# Patient Record
Sex: Female | Born: 1995 | Race: White | Hispanic: No | Smoking: Never smoker
Health system: Southern US, Community
[De-identification: ages and names within clinical notes are randomized; demographics above are authoritative.]

## PROBLEM LIST (undated history)

## (undated) ENCOUNTER — Inpatient Hospital Stay (HOSPITAL_COMMUNITY): Payer: Self-pay

## (undated) DIAGNOSIS — I10 Essential (primary) hypertension: Secondary | ICD-10-CM

## (undated) DIAGNOSIS — F329 Major depressive disorder, single episode, unspecified: Secondary | ICD-10-CM

## (undated) DIAGNOSIS — G8190 Hemiplegia, unspecified affecting unspecified side: Principal | ICD-10-CM

## (undated) DIAGNOSIS — F32A Depression, unspecified: Secondary | ICD-10-CM

## (undated) DIAGNOSIS — N390 Urinary tract infection, site not specified: Secondary | ICD-10-CM

## (undated) DIAGNOSIS — G47411 Narcolepsy with cataplexy: Secondary | ICD-10-CM

## (undated) DIAGNOSIS — F419 Anxiety disorder, unspecified: Secondary | ICD-10-CM

## (undated) DIAGNOSIS — O24419 Gestational diabetes mellitus in pregnancy, unspecified control: Secondary | ICD-10-CM

## (undated) DIAGNOSIS — M549 Dorsalgia, unspecified: Secondary | ICD-10-CM

## (undated) HISTORY — DX: Urinary tract infection, site not specified: N39.0

## (undated) HISTORY — PX: NO PAST SURGERIES: SHX2092

## (undated) HISTORY — DX: Essential (primary) hypertension: I10

## (undated) HISTORY — DX: Hemiplegia, unspecified affecting unspecified side: G81.90

---
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
## 2014-01-04 IMAGING — CT CT ABD-PELV W/O CM
1 of 2 series · 15 of 32 positions shown, 19 images · non-contrast
Comparison: None.

CLINICAL DATA: Right flank pain.  Right lower quadrant abdominal
pain.  Nausea and vomiting.  Diarrhea.

CT ABDOMEN AND PELVIS WITHOUT CONTRAST
TECHNIQUE: Multidetector CT imaging of the abdomen and pelvis was
performed following the standard protocol without intravenous
contrast.

[Series 2: stone 160 5.0 b31f st · axial · 0.74mm/px · z∈[+566,+1026]mm · 15 of 100 slices shown, 19 images]
[im 4/100  soft-tissue]
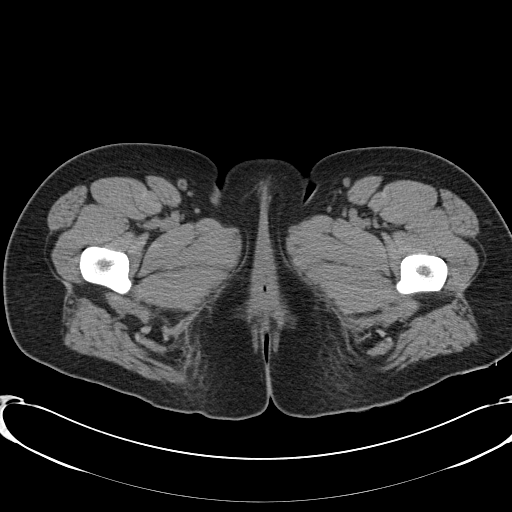
[im 4/100  bone]
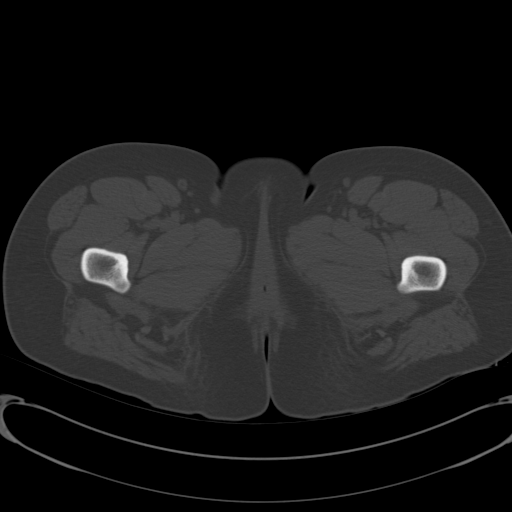
[im 12/100  soft-tissue]
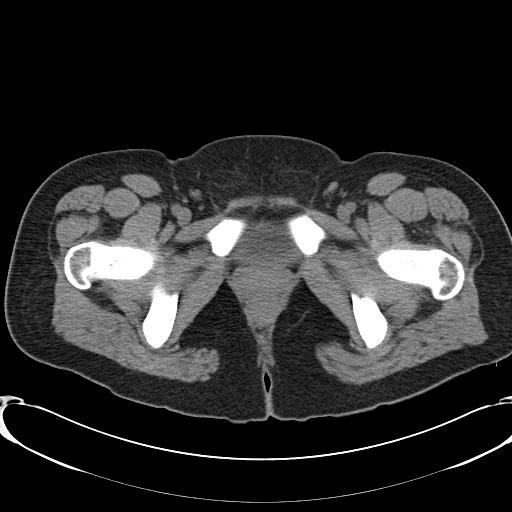
[im 20/100  soft-tissue]
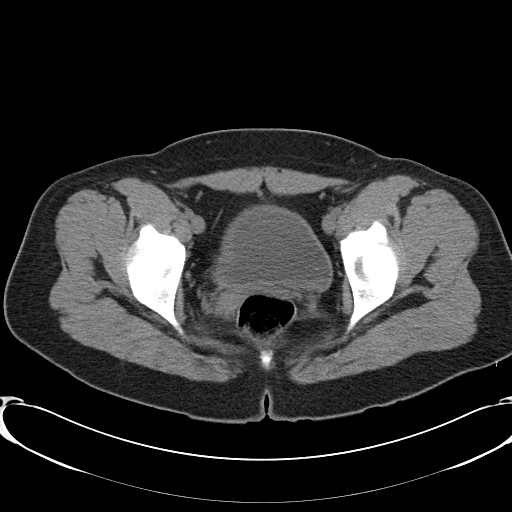
[im 28/100  soft-tissue]
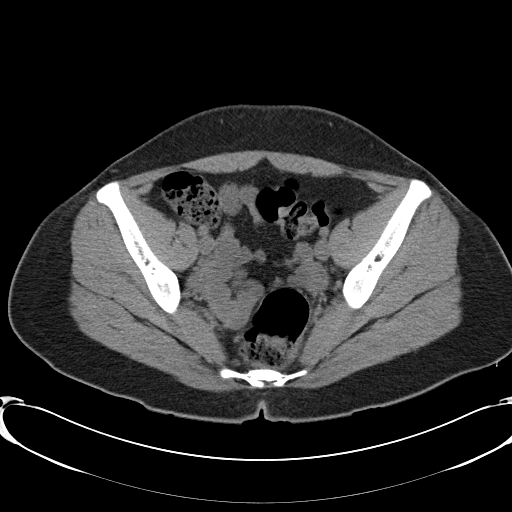
[im 36/100  soft-tissue]
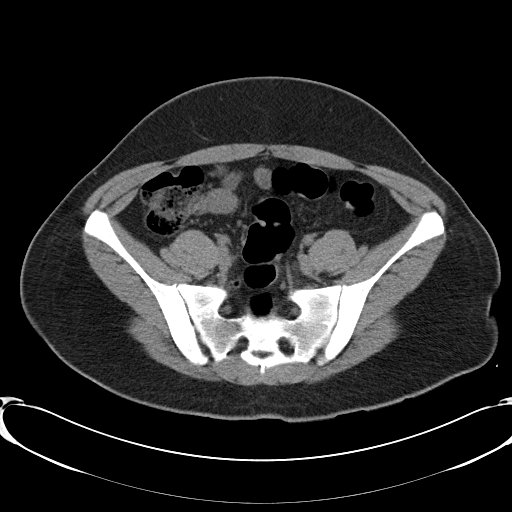
[im 44/100  soft-tissue]
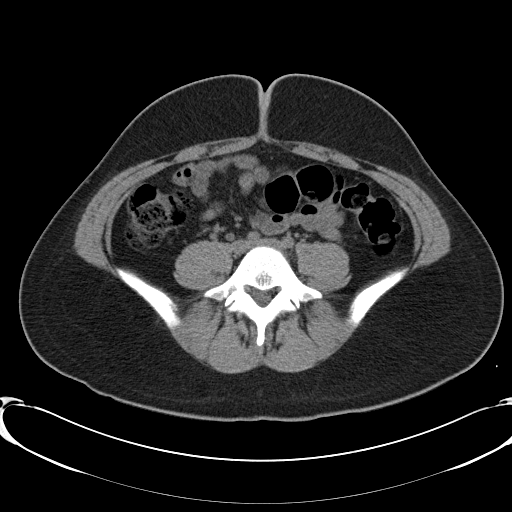
[im 52/100  soft-tissue]
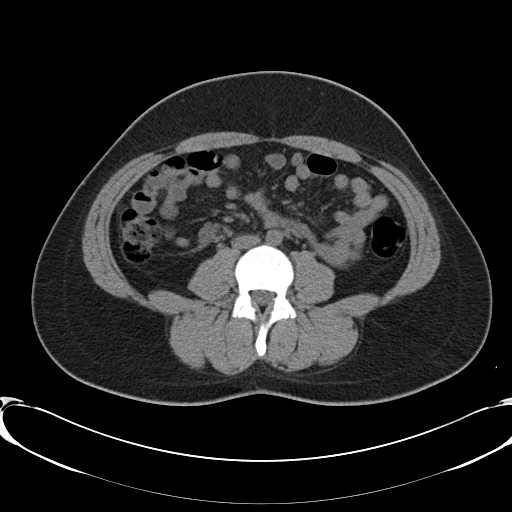
[im 56/100  soft-tissue]
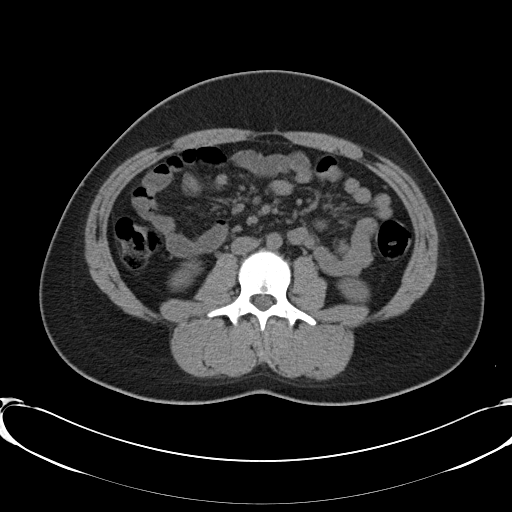
[im 64/100  soft-tissue]
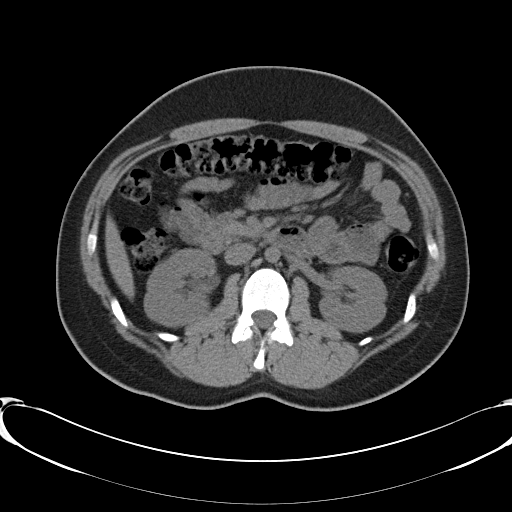
[im 64/100  bone]
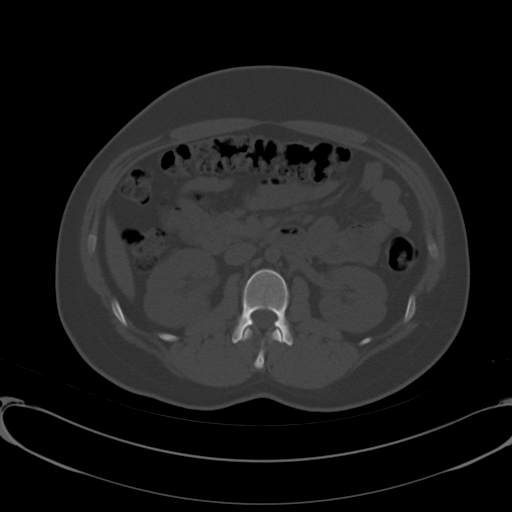
[im 72/100  soft-tissue]
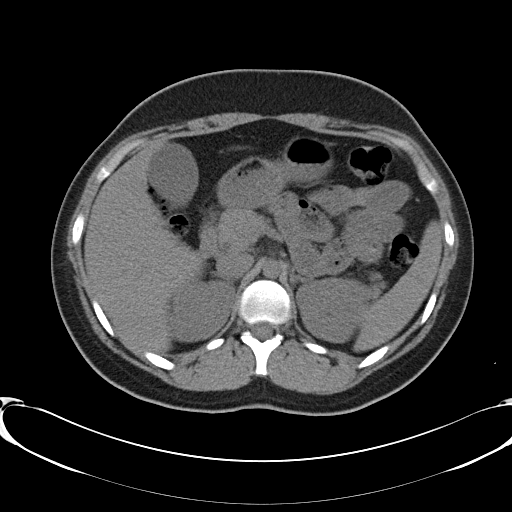
[im 80/100  soft-tissue]
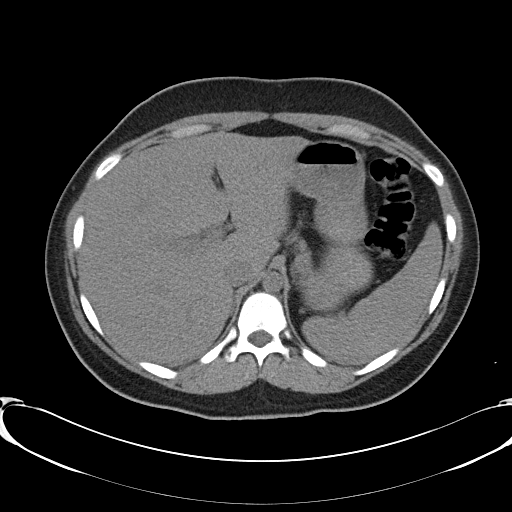
[im 84/100  lung]
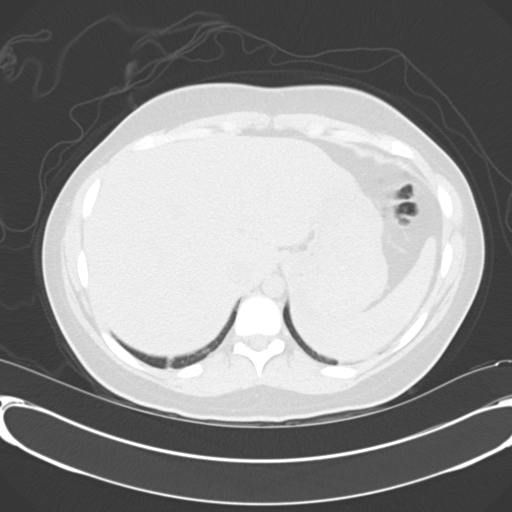
[im 88/100  soft-tissue]
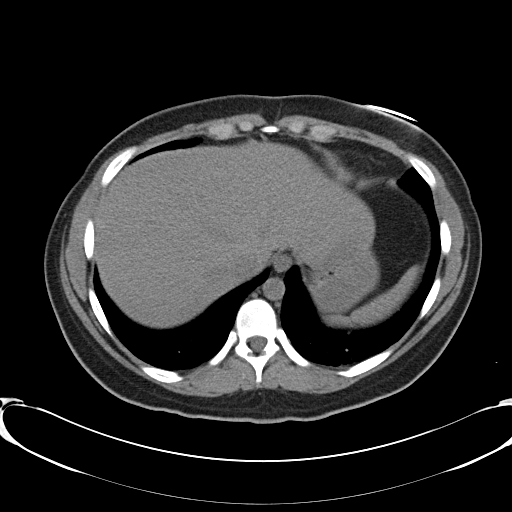
[im 88/100  lung]
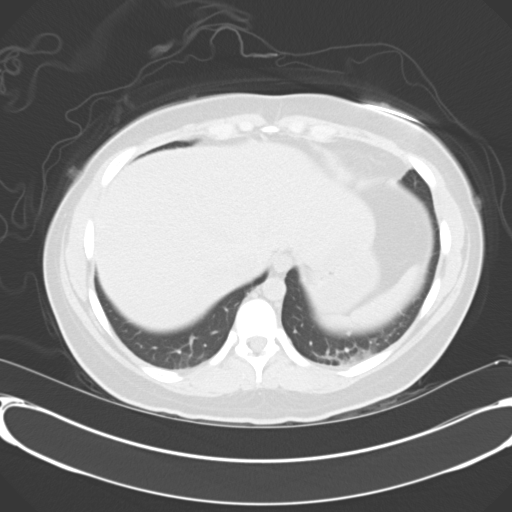
[im 92/100  lung]
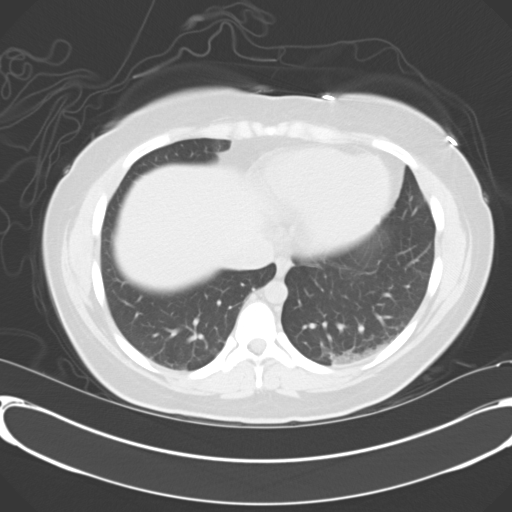
[im 96/100  soft-tissue]
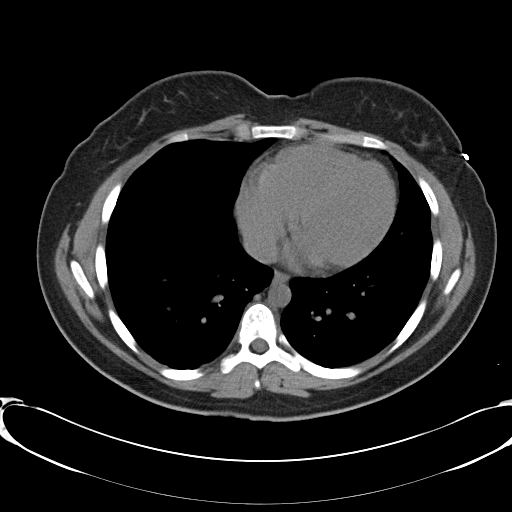
[im 96/100  lung]
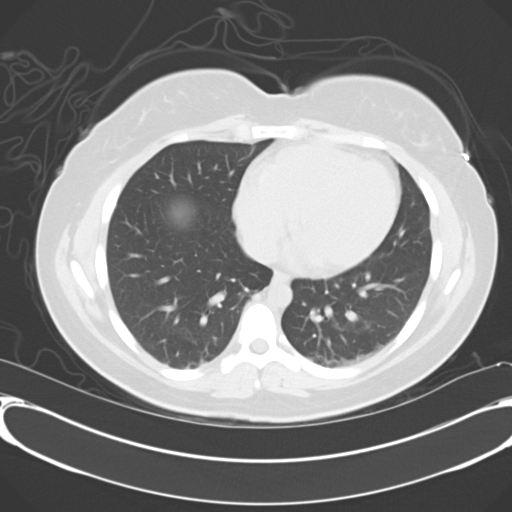

[15 of 32 positions shown; findings below may reference images not displayed]

FINDINGS: Mild atelectasis in the lung bases.  Mild dependent
atelectasis in the lung bases.

No renal, ureteral, or bladder stones are demonstrated.  There is
no significant pyelocaliectasis or ureterectasis.  The bladder wall
is not thickened.

The unenhanced appearance of the liver, spleen, gallbladder,
pancreas, adrenal glands, abdominal aorta, and retroperitoneal
lymph nodes is unremarkable.  The stomach, small bowel, and colon
are not abnormally distended.  Stool fills the colon.  No free air
or free fluid in the abdomen.

Pelvis:  The uterus and adnexal structures are not enlarged.  No
free or loculated pelvic fluid collections.  No evidence of
diverticulitis.  The appendix is normal.  No significant pelvic
lymphadenopathy.  Normal alignment of the lumbar vertebrae.  No
destructive bone lesions are appreciated.
IMPRESSION: No renal or ureteral stone or obstruction.  Appendix is normal.
Dependent atelectasis in the lung bases.

## 2014-01-18 ENCOUNTER — Ambulatory Visit (INDEPENDENT_AMBULATORY_CARE_PROVIDER_SITE_OTHER): Payer: Medicaid Other | Admitting: Family Medicine

## 2014-01-18 ENCOUNTER — Encounter: Payer: Self-pay | Admitting: Family Medicine

## 2014-01-18 VITALS — BP 127/81 | HR 77 | Wt 179.0 lb

## 2014-01-18 DIAGNOSIS — F53 Postpartum depression: Secondary | ICD-10-CM | POA: Insufficient documentation

## 2014-01-18 DIAGNOSIS — Z01812 Encounter for preprocedural laboratory examination: Secondary | ICD-10-CM

## 2014-01-18 DIAGNOSIS — O99345 Other mental disorders complicating the puerperium: Principal | ICD-10-CM

## 2014-01-18 DIAGNOSIS — Z30013 Encounter for initial prescription of injectable contraceptive: Secondary | ICD-10-CM

## 2014-01-18 LAB — POCT URINE PREGNANCY: Preg Test, Ur: NEGATIVE

## 2014-01-18 MED ORDER — MEDROXYPROGESTERONE ACETATE 150 MG/ML IM SUSP
150.0000 mg | INTRAMUSCULAR | Status: DC
  Administered 2014-01-18 – 2014-04-06 (×2): 150 mg via INTRAMUSCULAR

## 2014-01-18 MED ORDER — ESCITALOPRAM OXALATE 10 MG PO TABS: 10.0000 mg | ORAL_TABLET | Freq: Every day | ORAL | Status: DC

## 2014-01-18 NOTE — Patient Instructions (Addendum)
Depression Depression refers to feeling sad, low, down in the dumps, blue, gloomy, or empty. In general, there are two kinds of depression: 1. Normal sadness or normal grief. This kind of depression is one that we all feel from time to time after upsetting life experiences, such as the loss of a job or the ending of a relationship. This kind of depression is considered normal, is short lived, and resolves within a few days to 2 weeks. Depression experienced after the loss of a loved one (bereavement) often lasts longer than 2 weeks but normally gets better with time. 2. Clinical depression. This kind of depression lasts longer than normal sadness or normal grief or interferes with your ability to function at home, at work, and in school. It also interferes with your personal relationships. It affects almost every aspect of your life. Clinical depression is an illness. Symptoms of depression can also be caused by conditions other than those mentioned above, such as:  Physical illness. Some physical illnesses, including underactive thyroid gland (hypothyroidism), severe anemia, specific types of cancer, diabetes, uncontrolled seizures, heart and lung problems, strokes, and chronic pain are commonly associated with symptoms of depression.  Side effects of some prescription medicine. In some people, certain types of medicine can cause symptoms of depression.  Substance abuse. Abuse of alcohol and illicit drugs can cause symptoms of depression. SYMPTOMS Symptoms of normal sadness and normal grief include the following:  Feeling sad or crying for short periods of time.  Not caring about anything (apathy).  Difficulty sleeping or sleeping too much.  No longer able to enjoy the things you used to enjoy.  Desire to be by oneself all the time (social isolation).  Lack of energy or motivation.  Difficulty concentrating or remembering.  Change in appetite or weight.  Restlessness or  agitation. Symptoms of clinical depression include the same symptoms of normal sadness or normal grief and also the following symptoms:  Feeling sad or crying all the time.  Feelings of guilt or worthlessness.  Feelings of hopelessness or helplessness.  Thoughts of suicide or the desire to harm yourself (suicidal ideation).  Loss of touch with reality (psychotic symptoms). Seeing or hearing things that are not real (hallucinations) or having false beliefs about your life or the people around you (delusions and paranoia). DIAGNOSIS  The diagnosis of clinical depression is usually based on how bad the symptoms are and how long they have lasted. Your health care provider will also ask you questions about your medical history and substance use to find out if physical illness, use of prescription medicine, or substance abuse is causing your depression. Your health care provider may also order blood tests. TREATMENT  Often, normal sadness and normal grief do not require treatment. However, sometimes antidepressant medicine is given for bereavement to ease the depressive symptoms until they resolve. The treatment for clinical depression depends on how bad the symptoms are but often includes antidepressant medicine, counseling with a mental health professional, or both. Your health care provider will help to determine what treatment is best for you. Depression caused by physical illness usually goes away with appropriate medical treatment of the illness. If prescription medicine is causing depression, talk with your health care provider about stopping the medicine, decreasing the dose, or changing to another medicine. Depression caused by the abuse of alcohol or illicit drugs goes away when you stop using these substances. Some adults need professional help in order to stop drinking or using drugs. SEEK IMMEDIATE MEDICAL   CARE IF:  You have thoughts about hurting yourself or others.  You lose touch  with reality (have psychotic symptoms).  You are taking medicine for depression and have a serious side effect. FOR MORE INFORMATION  National Alliance on Mental Illness: www.nami.[employer]  National Institute of Mental Health: http://www.maynard.net/www.nimh.nih.gov Document Released: 01/26/2000 Document Revised: 06/14/2013 Document Reviewed: 04/29/2011 [facility] BoulevardExitCare Patient Information 2015 [place]ExitCare, [place]. This information is not intended to replace advice given to you by your health care provider. Make sure you discuss any questions you have with your health care provider. Place depot medroxyprogesterone acetate injection patient instructions here. Medroxyprogesterone injection [Contraceptive] What is this medicine? MEDROXYPROGESTERONE (me DROX ee proe JES te rone) contraceptive injections prevent pregnancy. They provide effective birth control for 3 months. Depo-subQ Provera 104 is also used for treating pain related to endometriosis. This medicine may be used for other purposes; ask your health care provider or pharmacist if you have questions. COMMON BRAND NAME(S): Depo-Provera, Depo-subQ Provera 104 What should I tell my health care provider before I take this medicine? They need to know if you have any of these conditions: -frequently drink alcohol -asthma -blood vessel disease or a history of a blood clot in the lungs or legs -bone disease such as osteoporosis -breast cancer -diabetes -eating disorder (anorexia nervosa or bulimia) -high blood pressure -HIV infection or AIDS -kidney disease -liver disease -mental depression -migraine -seizures (convulsions) -stroke -tobacco smoker -vaginal bleeding -an unusual or allergic reaction to medroxyprogesterone, other hormones, medicines, foods, dyes, or preservatives -pregnant or trying to get pregnant -breast-feeding How should I use this medicine? Depo-Provera Contraceptive injection is given into a muscle. Depo-subQ Provera 104 injection is given under  the skin. These injections are given by a health care professional. You must not be pregnant before getting an injection. The injection is usually given during the first 5 days after the start of a menstrual period or 6 weeks after delivery of a baby. Talk to your pediatrician regarding the use of this medicine in children. Special care may be needed. These injections have been used in female children who have started having menstrual periods. Overdosage: If you think you have taken too much of this medicine contact a poison control center or emergency room at once. NOTE: This medicine is only for you. Do not share this medicine with others. What if I miss a dose? Try not to miss a dose. You must get an injection once every 3 months to maintain birth control. If you cannot keep an appointment, call and reschedule it. If you wait longer than 13 weeks between Depo-Provera contraceptive injections or longer than 14 weeks between Depo-subQ Provera 104 injections, you could get pregnant. Use another method for birth control if you miss your appointment. You may also need a pregnancy test before receiving another injection. What may interact with this medicine? Do not take this medicine with any of the following medications: -bosentan This medicine may also interact with the following medications: -aminoglutethimide -antibiotics or medicines for infections, especially rifampin, rifabutin, rifapentine, and griseofulvin -aprepitant -barbiturate medicines such as phenobarbital or primidone -bexarotene -carbamazepine -medicines for seizures like ethotoin, felbamate, oxcarbazepine, phenytoin, topiramate -modafinil -St. John's wort This list may not describe all possible interactions. Give your health care provider a list of all the medicines, herbs, non-prescription drugs, or dietary supplements you use. Also tell them if you smoke, drink alcohol, or use illegal drugs. Some items may interact with your  medicine. What should I watch for while using this  medicine? This drug does not protect you against HIV infection (AIDS) or other sexually transmitted diseases. Use of this product may cause you to lose calcium from your bones. Loss of calcium may cause weak bones (osteoporosis). Only use this product for more than 2 years if other forms of birth control are not right for you. The longer you use this product for birth control the more likely you will be at risk for weak bones. Ask your health care professional how you can keep strong bones. You may have a change in bleeding pattern or irregular periods. Many females stop having periods while taking this drug. If you have received your injections on time, your chance of being pregnant is very low. If you think you may be pregnant, see your health care professional as soon as possible. Tell your health care professional if you want to get pregnant within the next year. The effect of this medicine may last a long time after you get your last injection. What side effects may I notice from receiving this medicine? Side effects that you should report to your doctor or health care professional as soon as possible: -allergic reactions like skin rash, itching or hives, swelling of the face, lips, or tongue -breast tenderness or discharge -breathing problems -changes in vision -depression -feeling faint or lightheaded, falls -fever -pain in the abdomen, chest, groin, or leg -problems with balance, talking, walking -unusually weak or tired -yellowing of the eyes or skin Side effects that usually do not require medical attention (report to your doctor or health care professional if they continue or are bothersome): -acne -fluid retention and swelling -headache -irregular periods, spotting, or absent periods -temporary pain, itching, or skin reaction at site where injected -weight gain This list may not describe all possible side effects. Call your  doctor for medical advice about side effects. You may report side effects to FDA at 1-800-FDA-1088. Where should I keep my medicine? This does not apply. The injection will be given to you by a health care professional. NOTE: This sheet is a summary. It may not cover all possible information. If you have questions about this medicine, talk to your doctor, pharmacist, or health care provider.  2015, Elsevier/Gold Standard. (2008-02-19 18:37:56)

## 2014-01-18 NOTE — Progress Notes (Addendum)
    Subjective:    Patient ID: Barbara [name] is a 18 y.o. female presenting with postpartum depression  on 01/18/2014  HPI: Reports feeling depressed.  Not able to do anything. Sleeps all day.  Tearful. Sleeps with frequent awakening. reports anhedonia, decreased ability to concentrate. Symptoms started after giving birth.  Wanted nothing to do with him. Symptoms are worse. Has moved in with her grandmother. Is about to get separated from her husband.  Never went back to work post delivery. Feels like it has gotten to a point where she needs to get better. Feels unbonded with child. Reports telling her grandmother she would be better off dead.  Has no suicidal plan. Reports occasionally hearing someone knock on door or say her name when falling asleep.  Desires Depo.  LMP was 12/12/13.  No intercourse since October. Has been on Depo before.  Review of Systems  Constitutional: Negative for fever and chills.  Respiratory: Negative for shortness of breath.   Cardiovascular: Negative for chest pain.  Gastrointestinal: Negative for nausea, vomiting and abdominal pain.  Genitourinary: Negative for dysuria.  Skin: Negative for rash.  Psychiatric/Behavioral: Positive for suicidal ideas, hallucinations (questionable while falling asleep), sleep disturbance, dysphoric mood and decreased concentration. Negative for self-injury.      Objective:    BP 127/81 mmHg  Pulse 77  Wt 179 lb (81.194 kg)  LMP 12/15/2013  Breastfeeding? No Physical Exam  Constitutional: She is oriented to person, place, and time. She appears well-developed and well-nourished. No distress.  HENT:  Head: Normocephalic and atraumatic.  Eyes: No scleral icterus.  Neck: Neck supple.  Cardiovascular: Normal rate.   Pulmonary/Chest: Effort normal.  Abdominal: Soft.  Neurological: She is alert and oriented to person, place, and time.  Skin: Skin is warm and dry.  Psychiatric: Her affect is blunt. She is slowed. She is not  agitated, not aggressive and not withdrawn. Thought content is not delusional. She exhibits a depressed mood. She expresses no suicidal ideation. She expresses no suicidal plans and no homicidal plans.        Assessment & Plan:   Problem List Items Addressed This Visit      Unprioritized   Depressed mood with postpartum onset - Primary    Contracts for safety.  Seek psychiatric help.  Begin Anti-depressant.    Relevant Medications      escitalopram (LEXAPRO) tablet    Other Visit Diagnoses    Encounter for initial prescription of injectable contraceptive        Relevant Medications       medroxyPROGESTERone (DEPO-PROVERA) injection 150 mg    Other Relevant Orders       POCT urine pregnancy (Completed)       Return in about 3 months (around 04/19/2014).

## 2014-01-18 NOTE — Assessment & Plan Note (Signed)
Contracts for safety.  Seek psychiatric help.  Begin Anti-depressant.

## 2014-03-10 ENCOUNTER — Ambulatory Visit: Payer: Self-pay | Admitting: Family Medicine

## 2014-03-29 ENCOUNTER — Telehealth: Payer: Self-pay | Admitting: *Deleted

## 2014-03-29 ENCOUNTER — Encounter: Payer: Self-pay | Admitting: *Deleted

## 2014-03-29 DIAGNOSIS — N760 Acute vaginitis: Principal | ICD-10-CM

## 2014-03-29 DIAGNOSIS — B9689 Other specified bacterial agents as the cause of diseases classified elsewhere: Secondary | ICD-10-CM

## 2014-03-29 MED ORDER — METRONIDAZOLE 500 MG PO TABS: 500.0000 mg | ORAL_TABLET | Freq: Two times a day (BID) | ORAL | Status: DC

## 2014-03-29 NOTE — Telephone Encounter (Signed)
Patient called and needed something called in for bacterial vaginosis.  I have sent in Flagyl to patients pharmacy.

## 2014-04-06 ENCOUNTER — Other Ambulatory Visit: Payer: Self-pay | Admitting: Family Medicine

## 2014-04-06 ENCOUNTER — Encounter: Payer: Self-pay | Admitting: Family Medicine

## 2014-04-06 ENCOUNTER — Ambulatory Visit (INDEPENDENT_AMBULATORY_CARE_PROVIDER_SITE_OTHER): Payer: Medicaid Other | Admitting: Family Medicine

## 2014-04-06 VITALS — BP 113/74 | HR 71 | Ht 66.75 in | Wt 182.2 lb

## 2014-04-06 DIAGNOSIS — F53 Postpartum depression: Secondary | ICD-10-CM

## 2014-04-06 DIAGNOSIS — N390 Urinary tract infection, site not specified: Secondary | ICD-10-CM | POA: Diagnosis not present

## 2014-04-06 DIAGNOSIS — Z113 Encounter for screening for infections with a predominantly sexual mode of transmission: Secondary | ICD-10-CM | POA: Diagnosis not present

## 2014-04-06 DIAGNOSIS — Z30013 Encounter for initial prescription of injectable contraceptive: Secondary | ICD-10-CM

## 2014-04-06 DIAGNOSIS — O99345 Other mental disorders complicating the puerperium: Principal | ICD-10-CM

## 2014-04-06 DIAGNOSIS — K644 Residual hemorrhoidal skin tags: Secondary | ICD-10-CM

## 2014-04-06 DIAGNOSIS — K648 Other hemorrhoids: Secondary | ICD-10-CM

## 2014-04-06 DIAGNOSIS — N898 Other specified noninflammatory disorders of vagina: Secondary | ICD-10-CM | POA: Diagnosis not present

## 2014-04-06 LAB — POCT URINALYSIS DIPSTICK
Bilirubin, UA: NEGATIVE
GLUCOSE UA: NEGATIVE
Ketones, UA: NEGATIVE
Nitrite, UA: NEGATIVE
PROTEIN UA: NEGATIVE
Spec Grav, UA: 1.01
UROBILINOGEN UA: NORMAL
pH, UA: 6.5

## 2014-04-06 MED ORDER — ESCITALOPRAM OXALATE 20 MG PO TABS: 20.0000 mg | ORAL_TABLET | Freq: Every day | ORAL | Status: DC

## 2014-04-06 MED ORDER — HYDROCORTISONE ACETATE 25 MG RE SUPP: 25.0000 mg | Freq: Two times a day (BID) | RECTAL | Status: DC

## 2014-04-06 MED ORDER — NITROFURANTOIN MONOHYD MACRO 100 MG PO CAPS: 100.0000 mg | ORAL_CAPSULE | Freq: Two times a day (BID) | ORAL | Status: DC

## 2014-04-06 NOTE — Assessment & Plan Note (Signed)
Given slight improvement, will increase dose.  Return in 4 wks for re-review.  May need another SSRI and keep working at finding therapist. She will f/u with Primary care as well.

## 2014-04-06 NOTE — Progress Notes (Signed)
Subjective:    Patient ID: Barbara [name] is a 19 y.o. female presenting with Follow-up  on 04/06/2014  HPI: Patient is here to follow up on Lexapro and she does not feel like the Lexapro is helping with her energy levels.  She is sleepy all the time and not feeling like doing anything other than sleeping all the time.  She added iron to her vitamins to see if that would help but doesn't feel like it has made a difference. She reports increased energy levels.  Began 2x/day Lexapro last week. No difference noted yet.  Reports not having a lot of luck finding a therapist who takes medicaid. She also would like to discuss the Metronidazole which does not seem to be helping her BV  She continues to have fishy odor and increased discharge. She is sexually active. Started trying to treat for UTI which improved symptoms. Urine is dark in nature. She is also suffering with hemorrhoids. Reports bleeding with wiping.  Review of Systems  Constitutional: Negative for fever and chills.  Respiratory: Negative for shortness of breath.   Cardiovascular: Negative for chest pain.  Gastrointestinal: Positive for anal bleeding. Negative for nausea, vomiting and abdominal pain.  Genitourinary: Negative for dysuria.  Skin: Negative for rash.  Psychiatric/Behavioral: Positive for sleep disturbance, dysphoric mood and decreased concentration. Negative for hallucinations and self-injury. The patient is not hyperactive.       Objective:    BP 113/74 mmHg  Pulse 71  Ht 5' 6.75" (1.695 m)  Wt 182 lb 3.2 oz (82.645 kg)  BMI 28.77 kg/m2  LMP   Breastfeeding? No Physical Exam  Constitutional: She is oriented to person, place, and time. She appears well-developed and well-nourished. No distress.  HENT:  Head: Normocephalic and atraumatic.  Eyes: No scleral icterus.  Neck: Neck supple.  Cardiovascular: Normal rate.   Pulmonary/Chest: Effort normal.  Abdominal: Soft.  Genitourinary: Rectal exam shows  external hemorrhoid. Uterus is not enlarged and not tender. Cervix exhibits no motion tenderness. Vaginal discharge found.  Neurological: She is alert and oriented to person, place, and time.  Skin: Skin is warm and dry.  Psychiatric: She has a normal mood and affect.   U/a is positive for blood and leuks.     Assessment & Plan:   Problem List Items Addressed This Visit      Unprioritized   Depressed mood with postpartum onset - Primary    Given slight improvement, will increase dose.  Return in 4 wks for re-review.  May need another SSRI and keep working at finding therapist. She will f/u with Primary care as well.      Relevant Medications   escitalopram (LEXAPRO) tablet    Other Visit Diagnoses    Vaginal discharge        Check wet prep and G/Chlam    Relevant Orders    Wet prep, genital    Screen for STD (sexually transmitted disease)        Relevant Orders    GC/Chlamydia Probe Amp    HIV antibody (with reflex)    UTI (lower urinary tract infection)        Treat presumptively and culture.    Relevant Medications    nitrofurantoin, macrocrystal-monohydrate, (MACROBID) 100 MG capsule    Other Relevant Orders    POCT urinalysis dipstick (Completed)    Urine Culture    External hemorrhoid, bleeding        Relevant Medications    hydrocortisone (ANUSOL-HC) suppository  25 mg

## 2014-04-06 NOTE — Patient Instructions (Signed)

## 2014-04-07 LAB — HIV ANTIBODY (ROUTINE TESTING W REFLEX): HIV: NONREACTIVE

## 2014-04-07 LAB — GC/CHLAMYDIA PROBE AMP
CT Probe RNA: NEGATIVE
GC Probe RNA: NEGATIVE

## 2014-04-07 LAB — WET PREP, GENITAL
Trich, Wet Prep: NONE SEEN
Yeast Wet Prep HPF POC: NONE SEEN

## 2014-04-07 LAB — URINE CULTURE: Colony Count: 5000

## 2014-05-03 ENCOUNTER — Ambulatory Visit: Payer: Self-pay | Admitting: Family Medicine

## 2014-05-23 IMAGING — US US OB NUCHAL TRANSLUCENCY 1ST GEST
1 series · 13 of 26 positions shown · non-contrast
Comparison: none

[Series 1: us ob nuchal translucency 1st gest · 0.23mm/px · 13 of 26 slices shown]
[im 2/26]
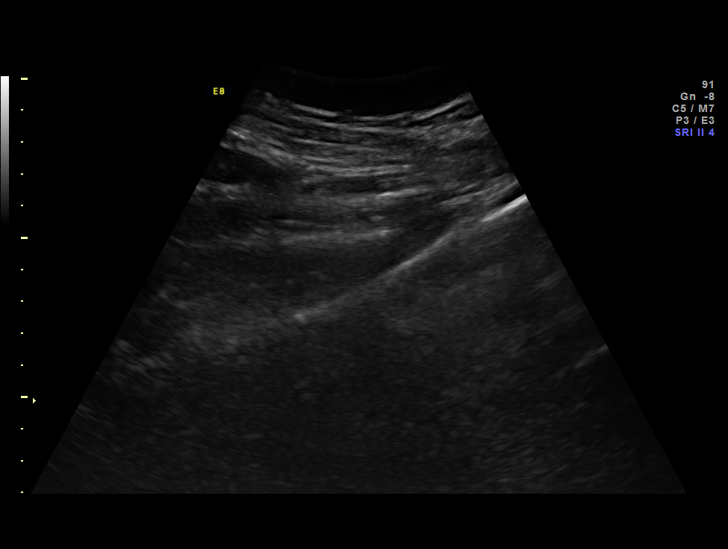
[im 4/26]
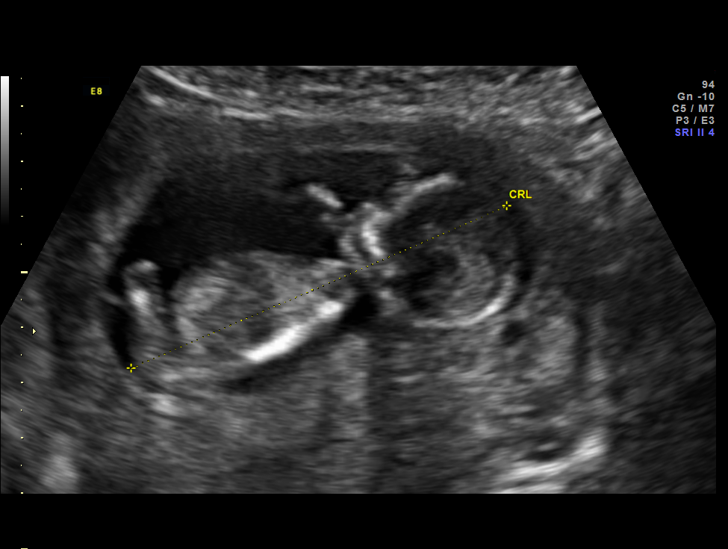
[im 6/26]
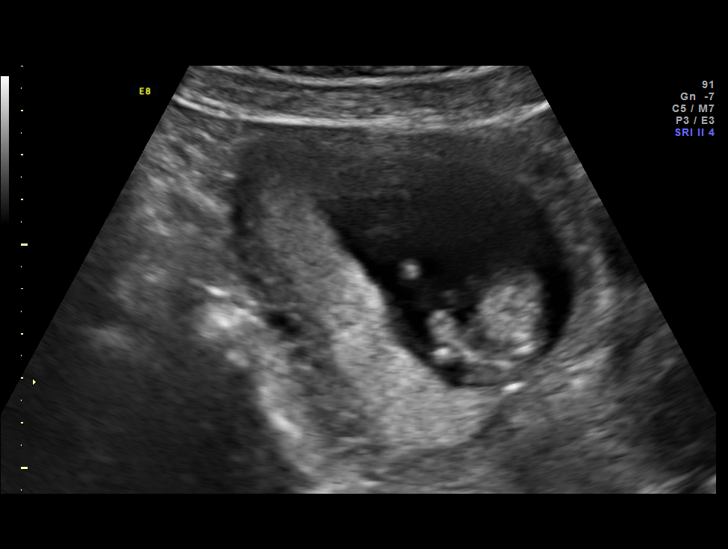
[im 8/26]
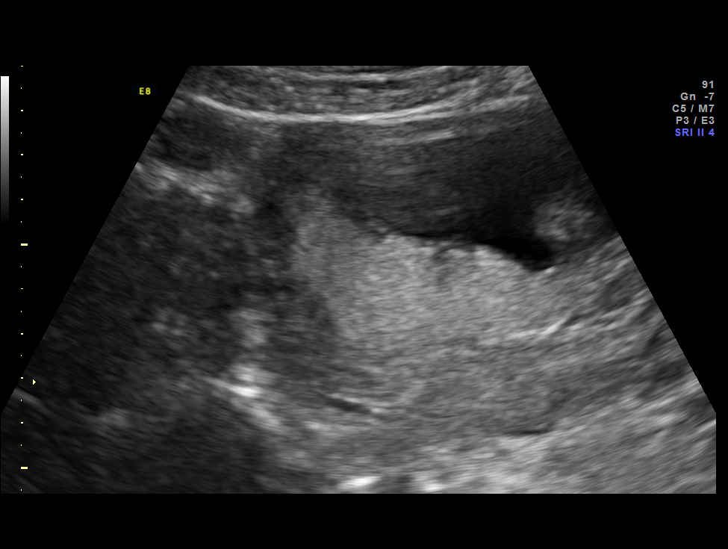
[im 10/26]
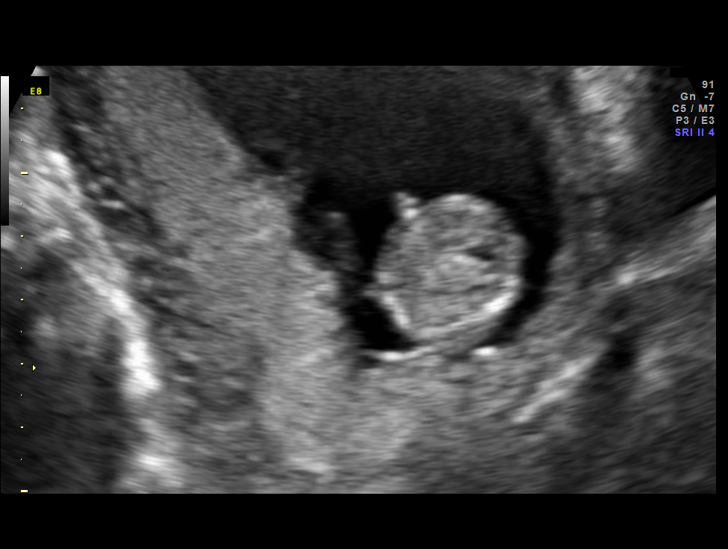
[im 12/26]
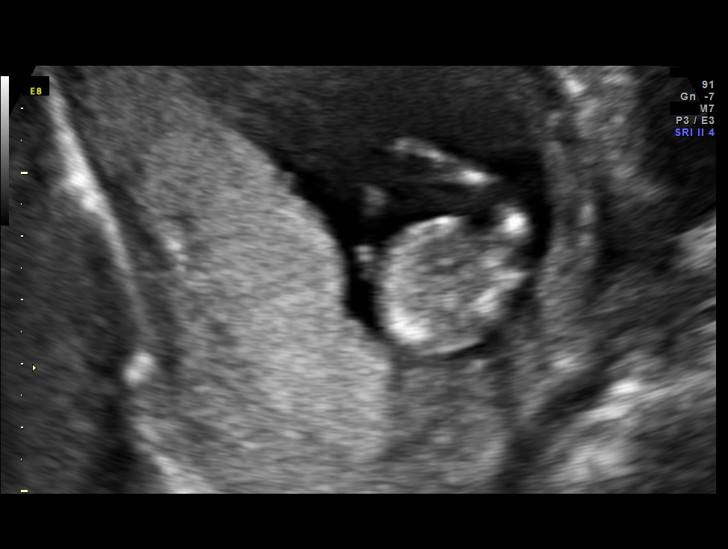
[im 14/26]
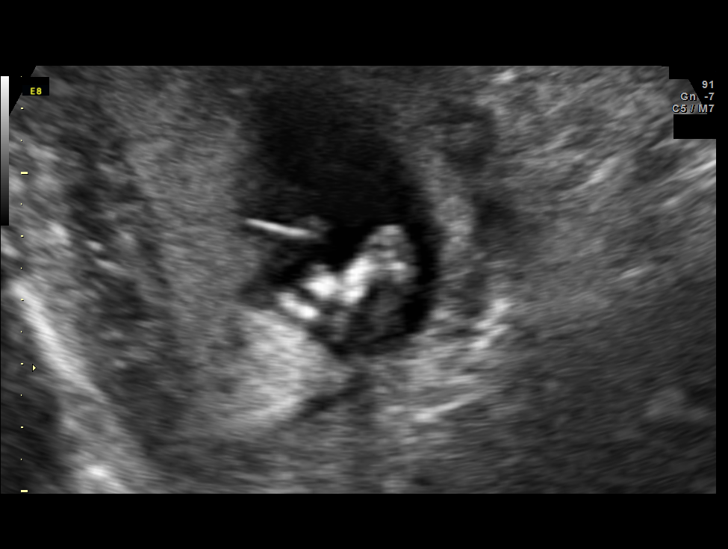
[im 16/26]
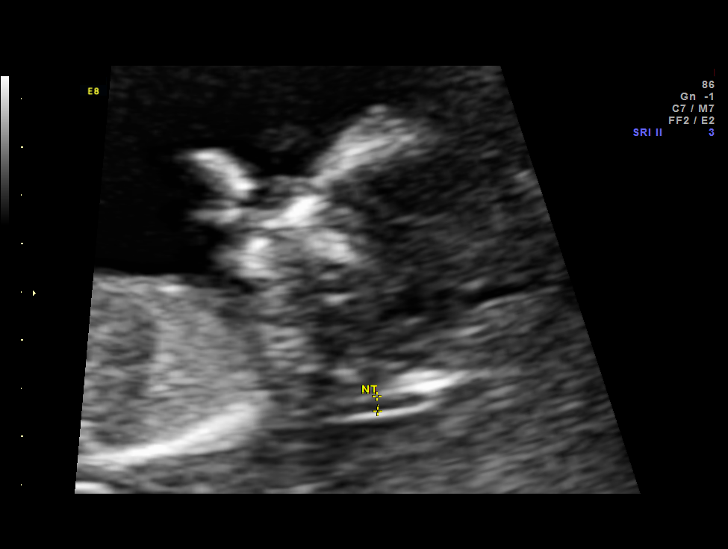
[im 18/26]
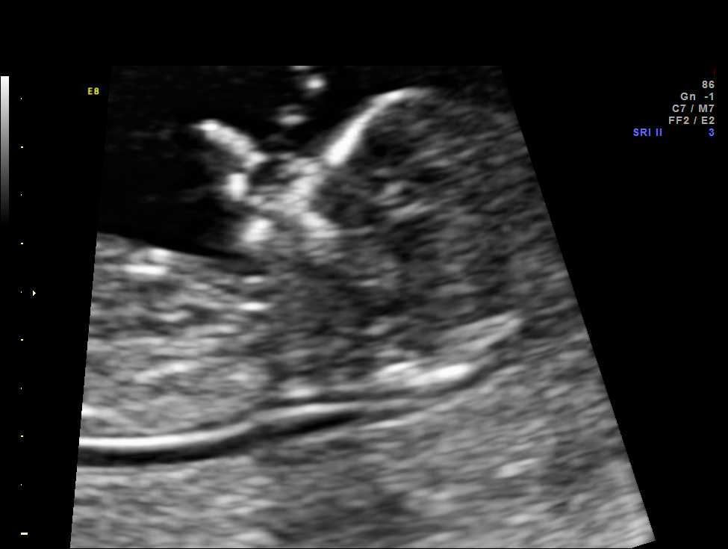
[im 20/26]
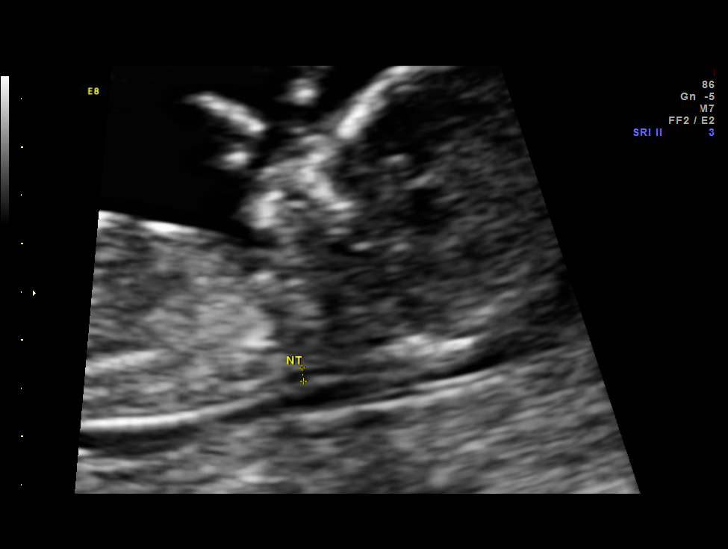
[im 22/26]
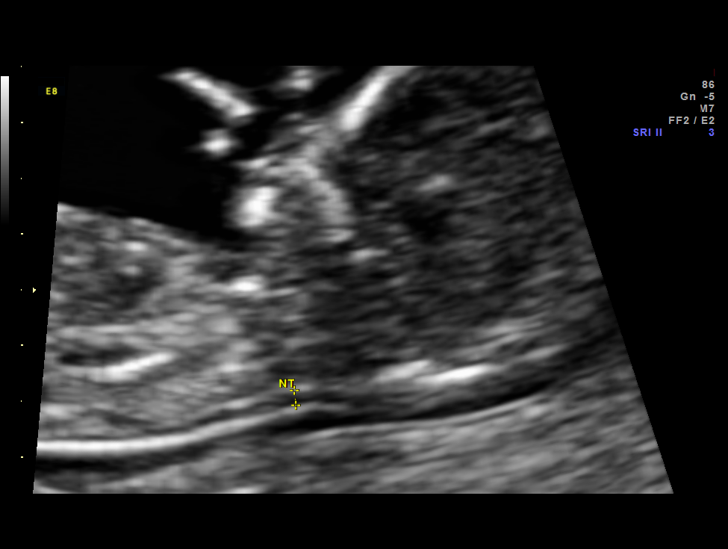
[im 24/26]
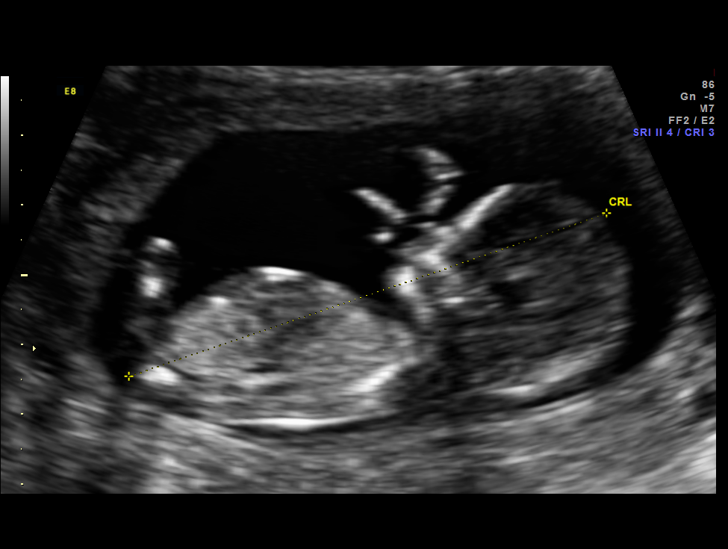
[im 26/26]
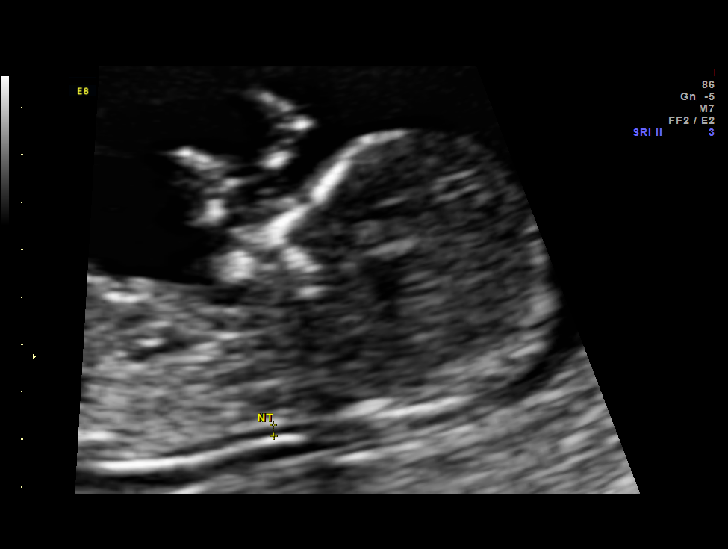

[13 of 26 positions shown; findings below may reference images not displayed]

OBSTETRICS REPORT
                      (Signed Final 08/21/2012 [DATE])

Service(s) Provided

 US FETAL NUCHAL TRANSLUCENCY                          76813.0
 MEASUREMENT
Indications

 First trimester aneuploidy screen (NT)
 Cigarette smoker
Fetal Evaluation

 Num Of Fetuses:    1
 Fetal Heart Rate:  148                          bpm
 Cardiac Activity:  Observed
 Placenta:          Posterior
 P. Cord            Visualized
 Insertion:

 Amniotic Fluid
 AFI FV:      Subjectively within normal limits
Gestational Age

 LMP:           16w 1d        Date:  04/30/12                 EDD:   02/04/13
 Best:          12w 5d     Det. By:  U/S C R L   (07/10/12)   EDD:   02/28/13
1st Trimester Genetic Sonogram Screening

 CRL:            72.4  mm    G. Age:   13w 1d                 EDD:   02/25/13
 Nuc Trans:       1.4  mm
 Nasal Bone:                 Present
Cervix Uterus Adnexa

 Cervix:       Not visualized (advanced GA >32wks)

 Left Ovary:    Not visualized. No adnexal mass visualized.
 Right Ovary:   Not visualized. No adnexal mass visualized.
Impression

 Active SIUP at 00w9d
 NT =1.4mm
 nasal bone present
 limited embryologic morphology is gestational age appropriate
Recommendations

 1. patient sent for 1st trimester analytes
 2. anatomic survey in 6-7 weeks.

 questions or concerns.

## 2014-08-31 ENCOUNTER — Ambulatory Visit (INDEPENDENT_AMBULATORY_CARE_PROVIDER_SITE_OTHER): Payer: Medicaid Other | Admitting: Neurology

## 2014-08-31 ENCOUNTER — Encounter: Payer: Self-pay | Admitting: Neurology

## 2014-08-31 VITALS — BP 117/80 | HR 84 | Ht 65.0 in | Wt 192.8 lb

## 2014-08-31 DIAGNOSIS — G8190 Hemiplegia, unspecified affecting unspecified side: Secondary | ICD-10-CM | POA: Diagnosis not present

## 2014-08-31 DIAGNOSIS — G479 Sleep disorder, unspecified: Secondary | ICD-10-CM

## 2014-08-31 HISTORY — DX: Hemiplegia, unspecified affecting unspecified side: G81.90

## 2014-08-31 NOTE — Patient Instructions (Signed)
Alternating Hemiplegia  Alternating hemiplegia is a rare neurological (nervous system) illness. It causes repeated (recurrent) but temporary episodes of paralysis. It develops in childhood, usually before the first 20 years of age. The paralysis happens on one side of the body. It can affect:   · Eye movements.  · Arms and legs.  · Facial muscles.  CAUSES   The exact cause of the disorder is unknown but it appears to be related to migraines. It can also occur due to stress or sleep deprivation.   SYMPTOMS   Symptoms of alternating hemiplegia can include:  · No movement (paralysis) to one side of the body.  · Balance and walking (gait) difficulties.  · Excessive sweating.  · Changes in body temperature.  · Convulsions (seizures).  · Decreased mental abilities (mental impairment).  TREATMENT   Drug therapy can help with alternating hemiplegia. It may reduce how severe the attacks of paralysis are and how long they last. Sleep can also help when an episode of alternating hemiplegia develops.   Document Released: 10/20/2001 Document Revised: 06/14/2013 Document Reviewed: 01/22/2008  ExitCare® Patient Information ©2015 [employer]. This information is not intended to replace advice given to you by your health care provider. Make sure you discuss any questions you have with your health care provider.

## 2014-08-31 NOTE — Progress Notes (Signed)
Reason for visit: Episodic hemiparesis  Referring physician: [name] is a 19 y.o. female  History of present illness:  Barbara [name] is a 19 year old right-handed white female with a history of episodes of transient hemiparesis dating back to around 02/03/2014. The patient had her first episode while walking in a mall area, she felt dizzy all of a sudden, and then the left side of the body became weak. The patient did not fall over, the episode lasted only a few seconds and then resolved. The patient has had a multitude of events, she currently indicates that the episodes have become more frequent and she is having episodes almost every other day. The patient will have episodes while standing or while sitting. She feels slightly confused during the events, she denies any numbness or headache or visual changes with the episodes. She denies any jerking. The patient denies any issues otherwise with balance or difficulty with controlling the bowels or the bladder. She is also reporting some increasing problems with excessive daytime drowsiness. She will sleep all day at times. She may snore at night, and she has been told that she jerks and twitches during sleep. She indicates that her maternal grandmother has seizures. She is being treated for depression at this time. She is afraid to operate a motor vehicle because of the excessive drowsiness. She comes in this office for an evaluation. She indicates that while awake, her appetite has significantly increased, and she eats all the time.  Past Medical History  Diagnosis Date  . Back pain   . Urinary tract infection     currently being treated w/Cephalexin  . Infection     UTI  . Hemiparesis 08/31/2014    Past Surgical History  Procedure Laterality Date  . No past surgeries      Family History  Problem Relation Age of Onset  . Depression Mother   . Hypertension Maternal Grandmother   . Depression Maternal Grandmother    . Asthma Maternal Grandmother   . Diabetes Maternal Grandmother   . Heart disease Maternal Grandmother   . Hearing loss Maternal Grandfather   . Healthy Brother   . Healthy Sister     Social history:  reports that she has never smoked. She has never used smokeless tobacco. She reports that she does not drink alcohol or use illicit drugs.  Medications:  Prior to Admission medications   Medication Sig Start Date End Date Taking? Authorizing Provider  escitalopram (LEXAPRO) 20 MG tablet Take 1 tablet (20 mg total) by mouth daily. 04/06/14  Yes Reva Bores, MD  FLUoxetine (PROZAC) 20 MG capsule Take 20 mg by mouth daily.   Yes Historical Provider, MD  hydrocortisone (ANUSOL-HC) 25 MG suppository Place 1 suppository (25 mg total) rectally 2 (two) times daily. 04/06/14  Yes Reva Bores, MD  metroNIDAZOLE (FLAGYL) 500 MG tablet Take 1 tablet (500 mg total) by mouth 2 (two) times daily. 03/29/14  Yes Tereso Newcomer, MD  Multiple Vitamins-Minerals (MULTIVITAMIN WITH MINERALS) tablet Take 1 tablet by mouth daily.   Yes Historical Provider, MD  nitrofurantoin, macrocrystal-monohydrate, (MACROBID) 100 MG capsule Take 1 capsule (100 mg total) by mouth 2 (two) times daily. 04/06/14  Yes Reva Bores, MD      Allergies  Allergen Reactions  . Corn-Containing Products Anaphylaxis and Swelling  . Other Itching and Nausea And Vomiting    SHERBET.  Marland Kitchen Percocet [Oxycodone-Acetaminophen] Other (See Comments)    States feels  like chest is tightening up.    ROS:  Out of a complete 14 system review of symptoms, the patient complains only of the following symptoms, and all other reviewed systems are negative.  Weight gain Snoring Blood in the stool Feeling hot Too much sleep Memory loss, weakness, slurred speech, dizziness Restless legs  Blood pressure 117/80, pulse 84, height 5\' 5"  (1.651 m), weight 192 lb 12.8 oz (87.454 kg), not currently breastfeeding.  Physical Exam  General: The  patient is alert and cooperative at the time of the examination. The patient is moderately obese.  Eyes: Pupils are equal, round, and reactive to light. Discs are flat bilaterally.  Neck: The neck is supple, no carotid bruits are noted.  Respiratory: The respiratory examination is clear.  Cardiovascular: The cardiovascular examination reveals a regular rate and rhythm, no obvious murmurs or rubs are noted.  Skin: Extremities are without significant edema.  Neurologic Exam  Mental status: The patient is alert and oriented x 3 at the time of the examination. The patient has apparent normal recent and remote memory, with an apparently normal attention span and concentration ability.  Cranial nerves: Facial symmetry is present. There is good sensation of the face to pinprick and soft touch bilaterally. The strength of the facial muscles and the muscles to head turning and shoulder shrug are normal bilaterally. Speech is well enunciated, no aphasia or dysarthria is noted. Extraocular movements are full. Visual fields are full. The tongue is midline, and the patient has symmetric elevation of the soft palate. No obvious hearing deficits are noted.  Motor: The motor testing reveals 5 over 5 strength of all 4 extremities. Good symmetric motor tone is noted throughout.  Sensory: Sensory testing is intact to pinprick, soft touch, vibration sensation, and position sense on all 4 extremities. No evidence of extinction is noted.  Coordination: Cerebellar testing reveals good finger-nose-finger and heel-to-shin bilaterally.  Gait and station: Gait is normal. Tandem gait is normal. Romberg is negative. No drift is seen.  Reflexes: Deep tendon reflexes are symmetric and normal bilaterally. Toes are downgoing bilaterally.   MRI brain 06/17/2014:  IMPRESSION: Normal examination.  * MRI scan images were reviewed online. I agree with the written report.    Assessment/Plan:  1. Excessive daytime  drowsiness  2. Episodic left hemiparesis  The etiology of the episodes of left-sided weakness are unclear. The patient reports excessive daytime drowsiness. Narcolepsy needs to be considered. The patient will undergo an EEG study to exclude epilepsy as a source of the events. It is possible that the episodes are psychogenic in nature. The patient will follow-up depending upon the results of the workup. She will be referred for EEG evaluation, and for a sleep evaluation. MRI of the brain has been done, this is unremarkable.  Marlan Palau. Keith Una Yeomans MD 08/31/2014 8:33 PM  [facility] [address] [place], [place] [id number]  Phone [phone] Fax [phone]

## 2014-09-07 ENCOUNTER — Ambulatory Visit (INDEPENDENT_AMBULATORY_CARE_PROVIDER_SITE_OTHER): Payer: Medicaid Other | Admitting: Neurology

## 2014-09-07 ENCOUNTER — Telehealth: Payer: Self-pay | Admitting: Neurology

## 2014-09-07 DIAGNOSIS — G8190 Hemiplegia, unspecified affecting unspecified side: Secondary | ICD-10-CM | POA: Diagnosis not present

## 2014-09-07 NOTE — Procedures (Signed)
     History: Barbara [name] is a 19 year old patient with a history of episodes of transient left-sided weakness that began in December 2015. The patient may feel slightly confused with the events. The patient is being evaluated for these episodes.  This is a routine EEG. No skull defects are noted. Medications include Lexapro, Prozac, and multivitamins.  EEG classification: Dysrhythmia grade 1 left hemisphere  Description of the recording: The background rhythms of this recording consists of a relatively well modulated medium amplitude alpha rhythm of 9 Hz that is reactive to opening and closure. As the record progresses, the patient initially is the waking state. Photic stimulation was not performed, hyperventilation was performed, and this results in a minimal buildup of the background rhythm activities without significant slowing seen. Towards the end of the recording, the patient appears to enter the drowsy state, without entering stage II sleep. During drowsiness, several events of mild left hemispheric theta slowing is seen. This is not seen during the waking state. There is no evidence of spike or spike-wave discharges. EKG monitor shows no evidence of cardiac rhythm abnormalities with a heart rate of 60.  Impression: This is a minimally abnormal EEG recording secondary to mild transient left hemispheric theta slowing during periods of drowsiness. No epileptiform discharges were seen. Clinical correlation is required.

## 2014-09-07 NOTE — Telephone Encounter (Signed)
I called patient. EEG study shows minimal intermittent left hemispheric slowing only seen during drowsiness. No clear epileptiform discharges are seen. This does not seem to be correlating well with the episodes of left-sided hemiparesis, and MRI evaluation the brain was unremarkable. The record is not clearly epileptic in nature. The clinical significance of this is not clear. Would plan on continue on with a sleep evaluation, if no other issues found, we may consider an empiric trial on Keppra in the future.

## 2014-09-08 NOTE — Telephone Encounter (Signed)
I called patient. The EEG shows some mild left-sided slowing during drowsiness, will continue on with this sleep evaluation.

## 2014-09-14 ENCOUNTER — Telehealth: Payer: Self-pay | Admitting: Neurology

## 2014-09-14 NOTE — Telephone Encounter (Signed)
I spoke to the patient. I explained that Dr. Anne Hahn wanted her to have a sleep study based on slowing during periods of drowsiness seen on the EEG (per telephone note 7/27). She verbalized understanding and wanted to know what the process is for having a sleeping study. I advised her that the order is in the computer and someone would be calling her soon to schedule. I spoke to [place], she is going to call the patient to schedule a consult with one of the sleep doctors. The patient mainly wanted to make sure she would still follow up with Dr. Anne Hahn after the sleep study and I assured her she would.

## 2014-09-14 NOTE — Telephone Encounter (Signed)
Pt is calling regarding results of EEG

## 2014-09-14 NOTE — Telephone Encounter (Signed)
I called the patient and left a voicemail.

## 2014-10-12 ENCOUNTER — Ambulatory Visit (INDEPENDENT_AMBULATORY_CARE_PROVIDER_SITE_OTHER): Payer: Medicaid Other | Admitting: Neurology

## 2014-10-12 ENCOUNTER — Encounter (INDEPENDENT_AMBULATORY_CARE_PROVIDER_SITE_OTHER): Payer: Self-pay

## 2014-10-12 ENCOUNTER — Encounter: Payer: Self-pay | Admitting: Neurology

## 2014-10-12 VITALS — BP 130/76 | HR 90 | Resp 16 | Ht 65.0 in | Wt 193.0 lb

## 2014-10-12 DIAGNOSIS — G4761 Periodic limb movement disorder: Secondary | ICD-10-CM

## 2014-10-12 DIAGNOSIS — G479 Sleep disorder, unspecified: Secondary | ICD-10-CM

## 2014-10-12 DIAGNOSIS — G4710 Hypersomnia, unspecified: Secondary | ICD-10-CM

## 2014-10-12 DIAGNOSIS — R0683 Snoring: Secondary | ICD-10-CM

## 2014-10-12 DIAGNOSIS — G8190 Hemiplegia, unspecified affecting unspecified side: Secondary | ICD-10-CM | POA: Diagnosis not present

## 2014-10-12 NOTE — Progress Notes (Signed)
Subjective:    Patient ID: Barbara [name] is a 19 y.o. female.  HPI     Huston Foley, MD, PhD [facility] [address], [address] [address] [place], [place] [address]  Dear [name],   I saw your patient, Barbara [name], upon your kind request in my clinic today for initial consultation of her sleep disturbance, in particular, her excessive daytime somnolence. The patient is unaccompanied today. As you know, Ms. [name] is a 19 year old right-handed woman with an underlying medical history of obesity, depression, and low back pain, who has been seen you for episodic left-sided weakness. Workup thus far has been nonrevealing. She had a brain MRI with and without contrast on 06/16/2013, which was reported as normal. She had an EEG on 09/07/2014: This is a minimally abnormal EEG recording secondary to mild transient left hemispheric theta slowing during periods of drowsiness. No epileptiform discharges were seen. Clinical correlation is required.  You called her with her test results on 09/07/2014. He suggested consideration of Keppra next if sleep evaluation is negative.  I reviewed your office note from 08/31/2014. She reports a long-standing history of severe sleepiness even as a child. In school she had an IEP. This was primarily because she was sleeping in class. She has never had sleep study testing. She became depressed. She was treated with Lexapro and most recently in the last 6 months with Prozac which helped her depressive symptoms but not her sleepiness. She reports eating able to sleep all night long and all day long if possible. She has a 81-year-old son and lives with her husband, and her grandparents and has a stepson who stays off and on. She noticed episodes of weakness first when she became pregnant. Her episodes are very short lasting sometimes only seconds and she has a hard time keeping her eyes open. She's tried shaking her left side to make the weakness go  away. Sometimes she has back-to-back episodes. Often these weakness episodes happen when she is not particularly physically active. Episodes can happen while watching TV. She has had episodes when she was laughing. Episodes of scary to her and she has no control over the weakness. This scared her particularly when her son was an infant and she had a hard time carrying him. She denies any sleep paralysis, she denies any hypnagogic hallucinations but has had occasional auditory hallucinations in the middle of the night. She had a brain MRI in May 2016 which was normal. I reviewed the results. She has no family history of narcolepsy. She goes to bed at different times but can go to bed as early as 5 PM. On average she will go to bed between 8 and 8:30 PM and rise time because of her young son is around 8 AM. Despite sleeping probably close to 10 hours each night she can nap several times a day. She does report dreaming and her naps. Her Epworth sleepiness score is 24 out of 24 today, her fatigue score is 36 out of 63. She does snore occasionally. She denies any gasping sensations while asleep, nocturia, morning headaches, lower extremity swelling, or restless leg symptoms. Nevertheless, her husband has told her that she twitches in her sleep. She does not drink any alcohol, she does not smoke, she does not drink any caffeine and stop drinking caffeine when she got pregnant. She does not use any illicit drugs. 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 She works on Saturdays only, for a few hours.  She has found it hard to stay awake at work. She does not have a driver's license yet but would like to get a license but has been fearful of trying to get a license because of her sleepiness.   Her Past Medical History Is Significant For: Past Medical History  Diagnosis Date  . Back pain   . Urinary tract infection     currently being treated w/Cephalexin  . Infection     UTI  . Hemiparesis 08/31/2014     Her Past Surgical History Is Significant For: Past Surgical History  Procedure Laterality Date  . No past surgeries      Her Family History Is Significant For: Family History  Problem Relation Age of Onset  . Depression Mother   . Hypertension Maternal Grandmother   . Depression Maternal Grandmother   . Asthma Maternal Grandmother   . Diabetes Maternal Grandmother   . Heart disease Maternal Grandmother   . Hearing loss Maternal Grandfather   . Healthy Brother   . Healthy Sister     Her Social History Is Significant For: Social History   Social History  . Marital Status: Married    Spouse Name: N/A  . Number of Children: 1  . Years of Education: 11   Occupational History  . unemployed    Social History Main Topics  . Smoking status: Never Smoker   . Smokeless tobacco: Never Used  . Alcohol Use: No  . Drug Use: No  . Sexual Activity:    Partners: Male    Birth Control/ Protection: Injection     Comment: Depo Provera   Other Topics Concern  . None   Social History Narrative   Patient does not drink caffeine.   Patient is right handed.    Her Allergies Are:  Allergies  Allergen Reactions  . Corn-Containing Products Anaphylaxis and Swelling  . Other Itching and Nausea And Vomiting    SHERBET.  Marland Kitchen Percocet [Oxycodone-Acetaminophen] Other (See Comments)    States feels like chest is tightening up.  :   Her Current Medications Are:  Outpatient Encounter Prescriptions as of 10/12/2014  Medication Sig  . escitalopram (LEXAPRO) 20 MG tablet Take 1 tablet (20 mg total) by mouth daily.  Marland Kitchen FLUoxetine (PROZAC) 20 MG capsule Take 20 mg by mouth daily.  . hydrocortisone (ANUSOL-HC) 25 MG suppository Place 1 suppository (25 mg total) rectally 2 (two) times daily.  . metroNIDAZOLE (FLAGYL) 500 MG tablet Take 1 tablet (500 mg total) by mouth 2 (two) times daily.  . Multiple Vitamins-Minerals (MULTIVITAMIN WITH MINERALS) tablet Take 1 tablet by mouth daily.  .  nitrofurantoin, macrocrystal-monohydrate, (MACROBID) 100 MG capsule Take 1 capsule (100 mg total) by mouth 2 (two) times daily.   Facility-Administered Encounter Medications as of 10/12/2014  Medication  . medroxyPROGESTERone (DEPO-PROVERA) injection 150 mg  :  Review of Systems:  Out of a complete 14 point review of systems, all are reviewed and negative with the exception of these symptoms as listed below:   Review of Systems  Constitutional:       Weight gain   HENT: Positive for hearing loss.   Gastrointestinal: Positive for blood in stool.  Endocrine: Positive for polydipsia.       Feeling hot, feeling cold  Musculoskeletal:       Cramps  Neurological:       No trouble falling or staying asleep, no snoring, witnessed apnea, takes naps during day, daytime tiredness.     Objective:  Neurologic Exam  Physical Exam Physical Examination:   Filed Vitals:   10/12/14 0841  BP: 130/76  Pulse: 90  Resp: 16    General Examination: The patient is a very pleasant 18 y.o. female in no acute distress. She appears well-developed and well-nourished and adequately groomed.   HEENT: Normocephalic, atraumatic, pupils are equal, round and reactive to light and accommodation. Funduscopic exam is normal with sharp disc margins noted. Extraocular tracking is good without limitation to gaze excursion or nystagmus noted. Normal smooth pursuit is noted. Hearing is grossly intact. Tympanic membranes are clear bilaterally. Face is symmetric with normal facial animation and normal facial sensation. Speech is clear with no dysarthria noted. There is no hypophonia. There is no lip, neck/head, jaw or voice tremor. Neck is supple with full range of passive and active motion. There are no carotid bruits on auscultation. Oropharynx exam reveals: mild mouth dryness, adequate dental hygiene and mild airway crowding, due to redundant soft palate and tonsils in place, tonsils are about 1+ bilaterally, Mallampati  is class II, tongue protrudes centrally and palate elevates symmetrically.  Neck size is 14-1/2 inches.  Chest: Clear to auscultation without wheezing, rhonchi or crackles noted.  Heart: S1+S2+0, regular and normal without murmurs, rubs or gallops noted.   Abdomen: Soft, non-tender and non-distended with normal bowel sounds appreciated on auscultation.  Extremities: There is no pitting edema in the distal lower extremities bilaterally. Pedal pulses are intact.  Skin: Warm and dry without trophic changes noted. There are no varicose veins.  Musculoskeletal: exam reveals no obvious joint deformities, tenderness or joint swelling or erythema.   Neurologically:  Mental status: The patient is awake, alert and oriented in all 4 spheres. Her immediate and remote memory, attention, language skills and fund of knowledge are appropriate. There is no evidence of aphasia, agnosia, apraxia or anomia. Speech is clear with normal prosody and enunciation. Thought process is linear. Mood is normal and affect is normal.  Cranial nerves II - XII are as described above under HEENT exam. In addition: shoulder shrug is normal with equal shoulder height noted. Motor exam: Normal bulk, strength and tone is noted. There is no drift, tremor or rebound. Romberg is negative. Reflexes are 2+ throughout. Babinski: Toes are flexor bilaterally. Fine motor skills and coordination: intact with normal finger taps, normal hand movements, normal rapid alternating patting, normal foot taps and normal foot agility.  Cerebellar testing: No dysmetria or intention tremor on finger to nose testing. Heel to shin is unremarkable bilaterally. There is no truncal or gait ataxia.  Sensory exam: intact to light touch, pinprick, vibration, temperature sense in the upper and lower extremities.  Gait, station and balance: She stands easily but does tend to lock her knees but overextending them. She states that she feels safer to stand like this  because she is worried that she will get weak on the left side and fall. Tandem walk is unremarkable.  Otherwise, gait, station and balance are unremarkable.               Assessment and Plan:   In summary, KIRSTYN LEAN is a very pleasant 19 y.o.-year old female with an underlying medical history of obesity, depression, and low back pain, who has a several year history of excessive daytime somnolence and history of episodic left-sided weakness which is very brief. Workup thus far has been fairly nonrevealing with minor abnormalities noted on her recent EEG. I explained EEG findings to her today again. We talked  about her normal brain MRI as well. Her history is quite concerning for a hypersomnolence disorder, in particular possible narcolepsy. She is reassured that she has a normal physical and neurological exam.  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 We talked about medical treatments and non-pharmacological approaches. At this juncture, we will proceed with further testing in the form of sleep study overnight followed by a daytime nap test (MSLT). This will help with diagnosis and we have to wait out test results. She has not been on Prozac for the past couple of months. She states that she ran out and has just picked up a prescription but has not started it yet. She is advised not to start it yet as she should be off of any psychotropic medications. In fact, currently, she is not on any prescription medications. I will see her back after sleep studies are completed. I answered all her questions today and she was in agreement with the plan. Thank you very much for allowing me to participate in the care of this nice patient. If I can be of any further assistance to you please do not hesitate to talk to me.   Sincerely,   Huston Foley, MD, PhD

## 2014-10-12 NOTE — Patient Instructions (Addendum)
You may have a significant sleepiness disorder, possibly narcolepsy: This means, that you may have a sleep disorder that manifests with at times severe excessive sleepiness during the day and often with problems with sleep at night. We need to do more testing with a sleep study and daytime nap study to better look for signs for narcolepsy. Down the road: we may have to try different medications that may help you stay awake during the day. Not everything works with everybody the same way. Wake promoting agents include stimulants and non-stimulant type medications. The most common side effects with stimulants are weight loss, insomnia, nervousness, headaches, palpitations, rise in blood pressure, anxiety. Stimulants can be addictive and subject to abuse. Non-stimulant type wake promoting medications include Provigil and Nuvigil, most common side effects include headaches, nervousness, insomnia, hypertension. In addition there is a medication called Xyrem which has been proven to be very effective in patients with narcolepsy with or without cataplexy. Some patients with narcolepsy report episodes of weakness, such as jaw or facial weakness, legs giving out, feeling wobbly or like "Jell-o", etc. in situations of anxiety, stress, laughter, sudden sadness, surprise, etc., which is called cataplexy.  Don't start any new medications, in particular do not start her anti-suppressant. We may try something else for your weakness episodes depending on what we find on sleep testing. We will bring you back after sleep testing is completed to discuss the findings.   Let's also hold off on seizure medications for now. Do not drive when you feel sleepy.

## 2014-11-09 ENCOUNTER — Ambulatory Visit (INDEPENDENT_AMBULATORY_CARE_PROVIDER_SITE_OTHER): Payer: Medicaid Other | Admitting: Neurology

## 2014-11-09 DIAGNOSIS — G47411 Narcolepsy with cataplexy: Secondary | ICD-10-CM

## 2014-11-09 DIAGNOSIS — G4761 Periodic limb movement disorder: Secondary | ICD-10-CM

## 2014-11-09 DIAGNOSIS — R0683 Snoring: Secondary | ICD-10-CM

## 2014-11-10 ENCOUNTER — Other Ambulatory Visit (INDEPENDENT_AMBULATORY_CARE_PROVIDER_SITE_OTHER): Payer: Self-pay

## 2014-11-10 ENCOUNTER — Other Ambulatory Visit: Payer: Self-pay

## 2014-11-10 ENCOUNTER — Encounter (INDEPENDENT_AMBULATORY_CARE_PROVIDER_SITE_OTHER): Payer: Medicaid Other | Admitting: Neurology

## 2014-11-10 DIAGNOSIS — G4761 Periodic limb movement disorder: Secondary | ICD-10-CM

## 2014-11-10 DIAGNOSIS — G8190 Hemiplegia, unspecified affecting unspecified side: Secondary | ICD-10-CM

## 2014-11-10 DIAGNOSIS — R0683 Snoring: Secondary | ICD-10-CM

## 2014-11-10 DIAGNOSIS — G479 Sleep disorder, unspecified: Secondary | ICD-10-CM

## 2014-11-10 DIAGNOSIS — G4710 Hypersomnia, unspecified: Secondary | ICD-10-CM

## 2014-11-10 DIAGNOSIS — G47411 Narcolepsy with cataplexy: Secondary | ICD-10-CM

## 2014-11-10 DIAGNOSIS — Z0289 Encounter for other administrative examinations: Secondary | ICD-10-CM

## 2014-11-10 NOTE — Sleep Study (Signed)
Please see the scanned sleep study interpretation located in the procedure tab in the chart view section.  

## 2014-11-11 ENCOUNTER — Telehealth: Payer: Self-pay | Admitting: Neurology

## 2014-11-11 NOTE — Telephone Encounter (Signed)
Dr. Anne Hahn' patient, seen by me on 10/12/14, PSG and MSLT on 11/09/14 and 11/10/14, respectively.  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 I would like to go over details of the studies and treatment options during an appointment. If not already arranged, please arrange for an appointment next available with me. We will talk about the test results and discuss treatment options at the time. Thanks, Pls route results to Dr. Anne Hahn and Dr. Elwyn Reach as well.  Huston Foley, MD, PhD [facility])

## 2014-11-14 NOTE — Telephone Encounter (Signed)
Left message with female to have patient call back, she is at work right now.

## 2014-11-15 NOTE — Telephone Encounter (Signed)
Patient is coming in 11/16/14 for f/u appt. Results will be given then.

## 2014-11-16 ENCOUNTER — Ambulatory Visit: Payer: Medicaid Other | Admitting: Neurology

## 2014-11-16 ENCOUNTER — Encounter: Payer: Self-pay | Admitting: Neurology

## 2014-11-16 ENCOUNTER — Ambulatory Visit (INDEPENDENT_AMBULATORY_CARE_PROVIDER_SITE_OTHER): Payer: Medicaid Other | Admitting: Neurology

## 2014-11-16 VITALS — BP 122/60 | HR 78 | Resp 16 | Ht 65.0 in | Wt 192.0 lb

## 2014-11-16 DIAGNOSIS — G479 Sleep disorder, unspecified: Secondary | ICD-10-CM

## 2014-11-16 DIAGNOSIS — G47411 Narcolepsy with cataplexy: Secondary | ICD-10-CM | POA: Diagnosis not present

## 2014-11-16 DIAGNOSIS — G4761 Periodic limb movement disorder: Secondary | ICD-10-CM

## 2014-11-16 MED ORDER — IMIPRAMINE HCL 10 MG PO TABS: ORAL_TABLET | ORAL | Status: DC

## 2014-11-16 NOTE — Patient Instructions (Signed)
You may have a condition called narcolepsy: This means, that you may have a sleep disorder that manifests with at times severe excessive sleepiness during the day and often with problems with sleep at night. We may have to try different medications that may help you stay awake during the day. Not everything works with everybody the same way. Wake promoting agents include stimulants and non-stimulant type medications. The most common side effects with stimulants are weight loss, insomnia, nervousness, headaches, palpitations, rise in blood pressure, anxiety. Stimulants can be addictive and subject to abuse. Non-stimulant type wake promoting medications include Provigil and Nuvigil, most common side effects include headaches, nervousness, insomnia, hypertension. In addition there is a medication called Xyrem which has been proven to be very effective in patients with narcolepsy with or without cataplexy. Some patients with narcolepsy report episodes of weakness, such as jaw or facial weakness, legs giving out, feeling wobbly or like "Jell-o", etc. in situations of anxiety, stress, laughter, sudden sadness, surprise, etc., which is called cataplexy.   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 Common side effects reported are: mouth dryness, drowsiness, confusion, dizziness.   Call or email through My Chart in about a month for an update, and we will consider a medication to help your daytime sleepiness, called Provigil. I will call it in for you in about a month, once you have been in touch with [place].   When we try the medication for sleepiness, we will likely try Provigil (generic: modafinil) 100 mg: 1 pill up to 2 times a day as needed. Avoid after 3 PM. Side effects include (but are not limited to): high blood pressure, headache, nervousness, palpitations, GI upset, tremor. You do  not have a prescription yet for it.   Talk to Dr. Elwyn Reach about your depression. She may want to hold off on any new medication for now.

## 2014-11-16 NOTE — Progress Notes (Signed)
Subjective:    [name] ID: Barbara [name] is a 19 y.o. female.  HPI     Interim history:   Barbara [name] is a 19 year old right-handed woman with an underlying medical history of obesity, depression, and low back pain, who presents for follow-up consultation of Barbara [name] daytime somnolence and episodic left-sided weakness. Barbara [name] is accompanied by Barbara [name] grandmother today, but Barbara [name] stepped out before conclusion of Barbara appointment. I first met Barbara [name] on 10/12/2014 at Barbara request of Dr. Jannifer [name], at which time Barbara [name] reported intermittent short-lived episodes of left-sided weakness and a long-standing history of excessive daytime somnolence. 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 I invited Barbara [name] back for sleep study. Barbara [name] had an overnight polysomnogram followed by an MSLT Barbara next day and I talked to Barbara [name] about Barbara [name] test results in detail today. Barbara [name] baseline sleep study from 11/09/2014 showed a sleep efficiency mildly reduced at 83.6% with a latency to sleep of 41 minutes and wake after sleep onset of 32.5 minutes with mild to moderate sleep fragmentation noted. Barbara [name] had an elevated arousal index, Barbara [name] had an increased percentage of stage II sleep, a decreased percentage of slow-wave sleep and a decreased percentage of REM sleep at 13% with a normal REM latency. Barbara [name] had mild PLMS with mild arousals. Barbara [name] had very mild intermittent snoring. Total AHI was normal at 0.5 per hour, average oxygen saturation was 95%, nadir was 88%. Barbara [name] had a nap study Barbara next day on 11/10/2014. Mean sleep latency for 4 naps was 2.5 minutes, Barbara [name] had to REM onset naps during nap #2 and 3.   Today, 11/16/2014: Barbara [name] reports feeling about Barbara same. Barbara [name] says that Barbara [name] had an episode of brief weakness as Barbara [name] came into clinic today. Barbara [name] had to hold onto something and symptoms past. Barbara [name] still feels sleepy. Barbara [name] has not yet started Barbara [name] antidepressant medication yet. Barbara [name] wanted to wait and see what Barbara [name] sleep study  results which show.  Previously:   10/12/2014: Workup under Dr. Jannifer [name] thus far has been nonrevealing. Barbara [name] had a brain MRI with and without contrast on 06/16/2013, which was reported as normal. Barbara [name] had an EEG on 09/07/2014: This is a minimally abnormal EEG recording secondary to mild transient left hemispheric theta slowing during periods of drowsiness. No epileptiform discharges were seen. Clinical correlation is required.  Dr. Jannifer [name] suggested consideration of Keppra next if sleep evaluation was negative.   I reviewed your office note from 08/31/2014. Barbara [name] reports a long-standing history of severe sleepiness even as a child. In school Barbara [name] had an IEP. This was primarily because Barbara [name] was sleeping in class. Barbara [name] has never had sleep study testing. Barbara [name] became depressed. Barbara [name] was treated with Lexapro and most recently in Barbara last 6 months with Prozac which helped Barbara [name] depressive symptoms but not Barbara [name] sleepiness. Barbara [name] reports eating able to sleep all night long and all day long if possible. Barbara [name] has a 40-year-old son and lives with Barbara [name] husband, and Barbara [name] grandparents and has a stepson who stays off and on. Barbara [name] noticed episodes of weakness first when Barbara [name] became pregnant. Barbara [name] episodes are very short lasting sometimes only seconds and Barbara [name] has a hard time keeping Barbara [name] eyes open. Barbara [name]'s tried shaking Barbara [name] left side to make Barbara weakness go away. Sometimes Barbara [name] has back-to-back episodes. Often these weakness episodes happen when Barbara [name] is not particularly physically active. Episodes can happen while watching TV. Barbara [name] has had episodes when Barbara [name] was laughing. Episodes of scary to  Barbara [name] and Barbara [name] has no control over Barbara weakness. This scared Barbara [name] particularly when Barbara [name] son was an infant and Barbara [name] had a hard time carrying him. Barbara [name] denies any sleep paralysis, Barbara [name] denies any hypnagogic hallucinations but has had occasional auditory hallucinations in Barbara middle of Barbara night. Barbara [name] had a brain MRI in May 2016 which was normal. I reviewed Barbara results. Barbara [name]  has no family history of narcolepsy. Barbara [name] goes to bed at different times but can go to bed as early as 5 PM. On average Barbara [name] will go to bed between 8 and 8:30 PM and rise time because of Barbara [name] young son is around 8 AM. Despite sleeping probably close to 10 hours each night Barbara [name] can nap several times a day. Barbara [name] does report dreaming and Barbara [name] naps. Barbara [name] Epworth sleepiness score is 24 out of 24 today, Barbara [name] fatigue score is 36 out of 63. Barbara [name] does snore occasionally. Barbara [name] denies any gasping sensations while asleep, nocturia, morning headaches, lower extremity swelling, or restless leg symptoms. Nevertheless, Barbara [name] husband has told Barbara [name] that Barbara [name] twitches in Barbara [name] sleep. Barbara [name] does not drink any alcohol, Barbara [name] does not smoke, Barbara [name] does not drink any caffeine and stop drinking caffeine when Barbara [name] got pregnant. Barbara [name] does not use any illicit drugs. 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 Barbara [name] works on Saturdays only, for a few hours. Barbara [name] has found it hard to stay awake at work. Barbara [name] does not have a driver's license yet but would like to get a license but has been fearful of trying to get a license because of Barbara [name] sleepiness.   Barbara [name] Past Medical History Is Significant For: Past Medical History  Diagnosis Date  . Back pain   . Urinary tract infection     currently being treated w/Cephalexin  . Infection     UTI  . Hemiparesis ([place]) 08/31/2014    Barbara [name] Past Surgical History Is Significant For: Past Surgical History  Procedure Laterality Date  . No past surgeries      Barbara [name] Family History Is Significant For: Family History  Problem Relation Age of Onset  . Depression Mother   . Hypertension Maternal Grandmother   . Depression Maternal Grandmother   . Asthma Maternal Grandmother   . Diabetes Maternal Grandmother   . Heart disease Maternal Grandmother   . Hearing loss Maternal Grandfather   . Healthy Brother   . Healthy Sister     Barbara [name] Social History Is Significant For: Social History   Social History  . Marital  Status: Married    Spouse Name: N/A  . Number of Children: 1  . Years of Education: 11   Occupational History  . unemployed    Social History Main Topics  . Smoking status: Never Smoker   . Smokeless tobacco: Never Used  . Alcohol Use: No  . Drug Use: No  . Sexual Activity:    Partners: Male    Birth Control/ Protection: Injection     Comment: Depo Provera   Other Topics Concern  . None   Social History Narrative   [name] does not drink caffeine.   [name] is right handed.    Barbara [name] Allergies Are:  Allergies  Allergen Reactions  . Corn-Containing Products Anaphylaxis and Swelling  . Other Itching and Nausea And Vomiting    SHERBET.  Marland Kitchen Percocet [Oxycodone-Acetaminophen] Other (See Comments)    States feels like chest is tightening up.  :   Barbara [name] Current Medications Are:  Outpatient Encounter Prescriptions as of  11/16/2014  Medication Sig  . escitalopram (LEXAPRO) 20 MG tablet Take 1 tablet (20 mg total) by mouth daily. ([name] not taking: Reported on 11/16/2014)  . FLUoxetine (PROZAC) 20 MG capsule Take 20 mg by mouth daily.  . hydrocortisone (ANUSOL-HC) 25 MG suppository Place 1 suppository (25 mg total) rectally 2 (two) times daily. ([name] not taking: Reported on 11/16/2014)  . metroNIDAZOLE (FLAGYL) 500 MG tablet Take 1 tablet (500 mg total) by mouth 2 (two) times daily. ([name] not taking: Reported on 11/16/2014)  . Multiple Vitamins-Minerals (MULTIVITAMIN WITH MINERALS) tablet Take 1 tablet by mouth daily.  . nitrofurantoin, macrocrystal-monohydrate, (MACROBID) 100 MG capsule Take 1 capsule (100 mg total) by mouth 2 (two) times daily. ([name] not taking: Reported on 11/16/2014)   Facility-Administered Encounter Medications as of 11/16/2014  Medication  . medroxyPROGESTERone (DEPO-PROVERA) injection 150 mg  :  Review of Systems:  Out of a complete 14 point review of systems, all are reviewed and negative with Barbara exception of these symptoms as listed below:    Review of Systems  Neurological:       [name] is here to discuss sleep study. No new concerns.     Objective:  Neurologic Exam  Physical Exam Physical Examination:   Filed Vitals:   11/16/14 1304  BP: 122/60  Pulse: 78  Resp: 16   General Examination: Barbara [name] is a very pleasant 19 y.o. female in no acute distress. Barbara [name] appears well-developed and well-nourished and adequately groomed. Barbara [name] is in good spirits today, yawning repeatedly. Barbara [name] appears sleepy.   HEENT: Normocephalic, atraumatic, pupils are equal, round and reactive to light and accommodation. Extraocular tracking is good without limitation to gaze excursion or nystagmus noted. Normal smooth pursuit is noted. Hearing is grossly intact. Face is symmetric with normal facial animation and normal facial sensation. Speech is clear with no dysarthria noted. There is no hypophonia. There is no lip, neck/head, jaw or voice tremor. Neck is supple with full range of passive and active motion. There are no carotid bruits on auscultation. Oropharynx exam reveals: mild mouth dryness, adequate dental hygiene and mild airway crowding, due to redundant soft palate and tonsils in place, tonsils are about 1+ bilaterally, Mallampati is class II, tongue protrudes centrally and palate elevates symmetrically.    Chest: Clear to auscultation without wheezing, rhonchi or crackles noted.  Heart: S1+S2+0, regular and normal without murmurs, rubs or gallops noted.   Abdomen: Soft, non-tender and non-distended with normal bowel sounds appreciated on auscultation.  Extremities: There is no pitting edema in Barbara distal lower extremities bilaterally. Pedal pulses are intact.  Skin: Warm and dry without trophic changes noted. There are no varicose veins.  Musculoskeletal: exam reveals no obvious joint deformities, tenderness or joint swelling or erythema.   Neurologically:  Mental status: Barbara [name] is awake, alert and oriented in all 4 spheres. Barbara [name]  immediate and remote memory, attention, language skills and fund of knowledge are appropriate. There is no evidence of aphasia, agnosia, apraxia or anomia. Speech is clear with normal prosody and enunciation. Thought process is linear. Mood is normal and affect is normal.  Cranial nerves II - XII are as described above under HEENT exam. In addition: shoulder shrug is normal with equal shoulder height noted. Motor exam: Normal bulk, strength and tone is noted. There is no drift, tremor or rebound, no weakness. Romberg is negative. Reflexes are 2+ throughout. Babinski: Toes are flexor bilaterally. Fine motor skills and coordination: intact.  Cerebellar testing: No dysmetria or intention  tremor on finger to nose testing. Heel to shin is unremarkable bilaterally. There is no truncal or gait ataxia.  Sensory exam: intact to light touch in Barbara upper and lower extremities.  Gait, station and balance: Barbara [name] stands easily. Gait and station as well as tandem walk are unremarkable.   Assessment and Plan:   In summary, MING KUNKA is a very pleasant 19 year old female with an underlying medical history of obesity, depression, and low back pain, who presents for follow-up consultation of Barbara [name] excessive daytime somnolence and episodic left-sided weakness for which Barbara [name] has had workup under Dr. Jannifer [name] recently. Barbara [name] has a several year history of excessive daytime somnolence and history of episodic left-sided weakness which are brief and tend to happen when Barbara [name] is anxious. Minor abnormalities were noted on Barbara [name] recent EEG. I explained EEG findings to Barbara [name] today again. We talked about Barbara [name] normal brain MRI as well. 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 Barbara [name] continues to have a normal neurological and general physical exam and Barbara [name] is reassured. I talked to Barbara [name] at length today about Barbara [name] symptoms, previous test results, and sleep test results in detail. For Barbara [name] cataplexy as suggested symptomatic treatment with low-dose imipramine starting at 10 mg strength at night. Barbara [name] was advised about potential side effects including mouth dryness, confusion, balance problems and daytime sleepiness. We will try 20 mg at night and about 2 weeks if tolerated. For daytime somnolence Barbara [name] is advised to try Provigil. I have not yet provided Barbara [name] with a prescription for Provigil. I asked Barbara [name] to give [place] a call in about a month for an update and we will discuss trial of Provigil at Barbara time. Barbara [name] can take generic modafinil at Barbara time. I also provided Barbara [name] with information about Xyrem which we can consider down Barbara road. I would like to see Barbara [name] back in about 3 months for a recheck and Barbara [name] is agreeable to giving [place] a call or email he denies in about a month for an update and we will consider Provigil at Barbara time. We can also consider a stimulant medication. I gave Barbara [name] detailed written instructions today and a prescription for imipramine 10 mg strength with instructions for titration. I answered all Barbara [name] questions today and Barbara [name] was in agreement.  I spent 25 minutes in total face-to-face time with Barbara [name], more than 50% of which was spent in counseling and coordination of care, reviewing test results, reviewing medication and discussing or reviewing Barbara diagnosis of narcolepsy with cataplexy, Barbara prognosis and treatment options.

## 2014-11-17 LAB — COMPREHENSIVE DRUG ANALYSIS,UR: PDF: 0

## 2014-12-15 ENCOUNTER — Telehealth: Payer: Self-pay | Admitting: Neurology

## 2014-12-15 MED ORDER — MODAFINIL 100 MG PO TABS: 100.0000 mg | ORAL_TABLET | Freq: Two times a day (BID) | ORAL | Status: DC

## 2014-12-15 NOTE — Telephone Encounter (Signed)
Yes, please go ahead and fax Rx for Provigil, 100 mg: 1 pill up to 2 times a day, #60 with 3 refills, avoid after 3 PM. Pls call patient to reiterate instructions and SEs. thx

## 2014-12-15 NOTE — Telephone Encounter (Signed)
Rx has been added and provided to the pharmacy.  I called the patient to relay providers note.  Got no answer.  Left message.

## 2014-12-15 NOTE — Telephone Encounter (Signed)
Patient is calling to give an update on medication imipramine (TOFRANIL) 10 MG tablet. She states it seems to be working good. After taking this medication a Rx was to be called in for daytime sleepiness. Please call to [facility] on [address]. Please call patient and discuss. Thank you.

## 2014-12-15 NOTE — Telephone Encounter (Signed)
Last OV note says: When we try the medication for sleepiness, we will likely try Provigil (generic: modafinil) 100 mg: 1 pill up to 2 times a day as needed. Avoid after 3 PM. Side effects include (but are not limited to): high blood pressure, headache, nervousness, palpitations, GI upset, tremor.  Okay to proceed with Provigil Order?  Please advise.  Thank you.

## 2014-12-27 ENCOUNTER — Telehealth: Payer: Self-pay | Admitting: Neurology

## 2014-12-27 NOTE — Telephone Encounter (Signed)
I changed pharmacy in chart.

## 2014-12-27 NOTE — Telephone Encounter (Signed)
Patient called to advise of Pharmacy change. [facility] is now [facility] [address].

## 2014-12-29 ENCOUNTER — Other Ambulatory Visit: Payer: Self-pay | Admitting: Neurology

## 2015-01-07 ENCOUNTER — Emergency Department (HOSPITAL_COMMUNITY)
Admission: EM | Admit: 2015-01-07 | Discharge: 2015-01-08 | Disposition: A | Discharge status: Home / Self Care | Payer: Medicaid Other | Attending: Emergency Medicine | Admitting: Emergency Medicine

## 2015-01-07 ENCOUNTER — Encounter (HOSPITAL_COMMUNITY): Payer: Self-pay | Admitting: *Deleted

## 2015-01-07 ENCOUNTER — Emergency Department (HOSPITAL_COMMUNITY): Payer: Medicaid Other

## 2015-01-07 DIAGNOSIS — M25571 Pain in right ankle and joints of right foot: Secondary | ICD-10-CM

## 2015-01-07 DIAGNOSIS — Y9389 Activity, other specified: Secondary | ICD-10-CM | POA: Diagnosis not present

## 2015-01-07 DIAGNOSIS — Z87440 Personal history of urinary (tract) infections: Secondary | ICD-10-CM | POA: Diagnosis not present

## 2015-01-07 DIAGNOSIS — Y9289 Other specified places as the place of occurrence of the external cause: Secondary | ICD-10-CM | POA: Diagnosis not present

## 2015-01-07 DIAGNOSIS — S99911A Unspecified injury of right ankle, initial encounter: Secondary | ICD-10-CM | POA: Diagnosis present

## 2015-01-07 DIAGNOSIS — S93401A Sprain of unspecified ligament of right ankle, initial encounter: Secondary | ICD-10-CM

## 2015-01-07 DIAGNOSIS — Z79899 Other long term (current) drug therapy: Secondary | ICD-10-CM | POA: Diagnosis not present

## 2015-01-07 DIAGNOSIS — Y998 Other external cause status: Secondary | ICD-10-CM | POA: Diagnosis not present

## 2015-01-07 DIAGNOSIS — G47411 Narcolepsy with cataplexy: Secondary | ICD-10-CM | POA: Insufficient documentation

## 2015-01-07 DIAGNOSIS — R000 Tachycardia, unspecified: Secondary | ICD-10-CM | POA: Diagnosis not present

## 2015-01-07 DIAGNOSIS — W1839XA Other fall on same level, initial encounter: Secondary | ICD-10-CM | POA: Diagnosis not present

## 2015-01-07 HISTORY — DX: Narcolepsy with cataplexy: G47.411

## 2015-01-07 MED ORDER — IBUPROFEN 400 MG PO TABS
800.0000 mg | ORAL_TABLET | Freq: Once | ORAL | Status: AC
  Administered 2015-01-07: 800 mg via ORAL
  Filled 2015-01-07: qty 2

## 2015-01-07 NOTE — ED Notes (Signed)
Patient presents with right ankle swelling.  She was placing a car seat in the car and stepped and over tread her ankle.  Right ankle swelling +pulses

## 2015-01-07 NOTE — ED Notes (Signed)
Pt taken to xray 

## 2015-01-07 NOTE — ED Provider Notes (Signed)
CSN: [id number]     Arrival date & time 01/07/15  2306 History  By signing my name below, I, Barbara [name], attest that this documentation has been prepared under the direction and in the presence of Barbara Fowler, PA-C. Electronically Signed: Placido [name], ED Scribe. 01/07/2015. 11:29 PM.   Chief Complaint  Patient presents with  . Ankle Pain  . Fall   The history is provided by the patient. No language interpreter was used.   HPI Comments: Barbara [name] is a 19 y.o. female with a hx of narcolepsy with cataplexy who presents to the Emergency Department complaining of a fall that occurred this morning. She notes that she experienced an episode of her cataplexy which caused her to roll her right ankle and fall on her front porch resulting in constant, moderate, circumferential, right ankle pain and swelling. Pt notes worsening pain with any movement, weight bearing, or palpation of her right ankle. Pt denies taking any medication for pain management but notes applying ice which has provided little relief. She sees [facility] for management of her narcolepsy with cataplexy, further noting that she missed taking her prescribed medication today. She denies LOC, head trauma, numbness, tingling and weakness.   Past Medical History  Diagnosis Date  . Back pain   . Urinary tract infection     currently being treated w/Cephalexin  . Infection     UTI  . Hemiparesis (HCC) 08/31/2014  . Narcolepsy with cataplexy    Past Surgical History  Procedure Laterality Date  . No past surgeries     Family History  Problem Relation Age of Onset  . Depression Mother   . Hypertension Maternal Grandmother   . Depression Maternal Grandmother   . Asthma Maternal Grandmother   . Diabetes Maternal Grandmother   . Heart disease Maternal Grandmother   . Hearing loss Maternal Grandfather   . Healthy Brother   . Healthy Sister    Social History  Substance Use Topics  . Smoking status: Never Smoker    . Smokeless tobacco: Never Used  . Alcohol Use: No   OB History    Gravida Para Term Preterm AB TAB SAB Ectopic Multiple Living   Review of Systems A complete 10 system review of systems was obtained and all systems are negative except as noted in the HPI and PMH.   Allergies  Corn-containing products; Other; and Percocet  Home Medications   Prior to Admission medications   Medication Sig Start Date End Date Taking? Authorizing Provider  escitalopram (LEXAPRO) 20 MG tablet Take 1 tablet (20 mg total) by mouth daily. Patient not taking: Reported on 11/16/2014 04/06/14   Reva Bores, MD  FLUoxetine (PROZAC) 20 MG capsule Take 20 mg by mouth daily.    Historical Provider, MD  hydrocortisone (ANUSOL-HC) 25 MG suppository Place 1 suppository (25 mg total) rectally 2 (two) times daily. Patient not taking: Reported on 11/16/2014 04/06/14   Reva Bores, MD  ibuprofen (ADVIL,MOTRIN) 800 MG tablet Take 1 tablet (800 mg total) by mouth 3 (three) times daily. 01/08/15   Siddhant Hashemi, PA-C  imipramine (TOFRANIL) 10 MG tablet TAKE ONE PILL AT BEDTIME DAILY FOR 2 WEEKS, THEN INCREASE TO 2 PILLS EACH NIGHT THEREAFTER. 12/29/14   Huston Foley, MD  metroNIDAZOLE (FLAGYL) 500 MG tablet Take 1 tablet (500 mg total) by mouth 2 (two) times daily. Patient not taking: Reported on 11/16/2014 03/29/14  Tereso Newcomer, MD  modafinil (PROVIGIL) 100 MG tablet Take 1 tablet (100 mg total) by mouth 2 (two) times daily. 2nd dose should be taken before 3pm 12/15/14   Huston Foley, MD  Multiple Vitamins-Minerals (MULTIVITAMIN WITH MINERALS) tablet Take 1 tablet by mouth daily.    Historical Provider, MD  nitrofurantoin, macrocrystal-monohydrate, (MACROBID) 100 MG capsule Take 1 capsule (100 mg total) by mouth 2 (two) times daily. Patient not taking: Reported on 11/16/2014 04/06/14   Reva Bores, MD   BP 134/84 mmHg  Pulse 112  Temp(Src) 97.5 F (36.4 C) (Oral)  Resp 20  Ht  (1.651 m)  Wt  87.091 kg  BMI 31.95 kg/m2  SpO2 97% Physical Exam  Constitutional: She is oriented to person, place, and time. She appears well-developed and well-nourished.  HENT:  Head: Normocephalic and atraumatic.  Mouth/Throat: No oropharyngeal exudate.  Eyes: Conjunctivae are normal.  Neck: Normal range of motion. No tracheal deviation present.  Cardiovascular: Normal heart sounds and intact distal pulses.  Tachycardia present.   Pulses:      Dorsalis pedis pulses are 2+ on the right side, and 2+ on the left side.       Posterior tibial pulses are 2+ on the right side, and 2+ on the left side.  Capillary refill less than 3 seconds in LE.  Pulmonary/Chest: Effort normal and breath sounds normal. No respiratory distress.  Abdominal: Soft. Bowel sounds are normal. She exhibits no distension. There is no tenderness.  Musculoskeletal:       Right knee: Normal.       Left knee: Normal.       Right ankle: She exhibits decreased range of motion (secondary to pain) and swelling (mild). She exhibits no ecchymosis, no deformity, no laceration and normal pulse. Tenderness. Lateral malleolus, medial malleolus, AITFL, CF ligament and posterior TFL tenderness found. Achilles tendon normal. Achilles tendon exhibits normal Thompson's test results.       Left ankle: Achilles tendon normal.       Right lower leg: Normal.       Left lower leg: Normal.       Right foot: There is tenderness (dorsal aspect). There is normal range of motion, no bony tenderness, no swelling, normal capillary refill, no crepitus, no deformity and no laceration.       Left foot: Normal.  Neurological: She is alert and oriented to person, place, and time.  Strength and sensation intact bilaterally throughout lower extremities distal to injury.    Skin: Skin is warm, dry and intact. No abrasion, no bruising, no ecchymosis, no laceration and no lesion noted. She is not diaphoretic. No erythema.  Psychiatric: She has a normal mood and affect.  Her behavior is normal.  Nursing note and vitals reviewed.  ED Course  Procedures  DIAGNOSTIC STUDIES: Oxygen Saturation is 97% on RA, normal by my interpretation.    COORDINATION OF CARE: 11:28 PM Pt presents today due to right ankle pain. Discussed treatment plan with pt at bedside including DG's of her right ankle and right foot and reevaluation based on results of imaging. Pt agreed to plan.  Labs Review Labs Reviewed - No data to display  Imaging Review Dg Ankle Complete Right  01/07/2015  CLINICAL DATA:  Status post fall off porch, with right ankle and foot pain and swelling. Initial encounter. EXAM: RIGHT ANKLE - COMPLETE 3+ VIEW COMPARISON:  None. FINDINGS: There is no evidence of fracture or dislocation. The ankle mortise is intact;  the interosseous space is within normal limits. No talar tilt or subluxation is seen. The joint spaces are preserved. Mild lateral soft tissue swelling is noted. IMPRESSION: No evidence of fracture or dislocation. Electronically Signed   By: Roanna RaiderJeffery  Chang M.D.   On: 01/07/2015 23:54   Dg Foot Complete Right  01/07/2015  CLINICAL DATA:  Right ankle and foot pain and swelling after fell off of a porch this morning. EXAM: RIGHT FOOT COMPLETE - 3+ VIEW COMPARISON:  None. FINDINGS: There is no evidence of fracture or dislocation. There is no evidence of arthropathy or other focal bone abnormality. Soft tissues are unremarkable. IMPRESSION: Negative. Electronically Signed   By: Burman NievesWilliam  Stevens M.D.   On: 01/07/2015 23:55   I have personally reviewed and evaluated these images as part of my medical decision-making.   EKG Interpretation None      MDM   Final diagnoses:  Ankle sprain, right, initial encounter  Ankle pain, right    Patient presents with right ankle pain after a fall.  No head injury or LOC.  VS show HR of 139, will manage pain and reassess.  Upon reassessment, 112.  Suspect secondary to pain, will manage pain an recheck.  After pain  managed, HR 91.  On exam, mild swelling noted.  Skin intact.  NVI.  Intact distal pulses.  Will apply ice, motrin, and plain films of right ankle and foot.  Plain films negative.  Doubt fracture or dislocation.  Suspect ankle sprain. Will place in ASO brace and crutches as needed.  Evaluation does not show pathology requring ongoing emergent intervention or admission. Pt is hemodynamically stable and mentating appropriately. Discussed findings/results and plan with patient/guardian, who agrees with plan. All questions answered. Return precautions discussed and outpatient follow up given.   I personally performed the services described in this documentation, which was scribed in my presence. The recorded information has been reviewed and is accurate.      Barbara FowlerKayla Liel Rudden, PA-C 01/08/15 0012  Loren Raceravid Yelverton, MD 01/10/15 1345

## 2015-01-08 MED ORDER — IBUPROFEN 800 MG PO TABS: 800.0000 mg | ORAL_TABLET | Freq: Three times a day (TID) | ORAL | Status: DC

## 2015-01-08 NOTE — Discharge Instructions (Signed)
Ankle Sprain °An ankle sprain is an injury to the strong, fibrous tissues (ligaments) that hold the bones of your ankle joint together.  °CAUSES °An ankle sprain is usually caused by a fall or by twisting your ankle. Ankle sprains most commonly occur when you step on the outer edge of your foot, and your ankle turns inward. People who participate in sports are more prone to these types of injuries.  °SYMPTOMS  °· Pain in your ankle. The pain may be present at rest or only when you are trying to stand or walk. °· Swelling. °· Bruising. Bruising may develop immediately or within 1 to 2 days after your injury. °· Difficulty standing or walking, particularly when turning corners or changing directions. °DIAGNOSIS  °Your caregiver will ask you details about your injury and perform a physical exam of your ankle to determine if you have an ankle sprain. During the physical exam, your caregiver will press on and apply pressure to specific areas of your foot and ankle. Your caregiver will try to move your ankle in certain ways. An X-ray exam may be done to be sure a bone was not broken or a ligament did not separate from one of the bones in your ankle (avulsion fracture).  °TREATMENT  °Certain types of braces can help stabilize your ankle. Your caregiver can make a recommendation for this. Your caregiver may recommend the use of medicine for pain. If your sprain is severe, your caregiver may refer you to a surgeon who helps to restore function to parts of your skeletal system (orthopedist) or a physical therapist. °HOME CARE INSTRUCTIONS  °· Apply ice to your injury for 1-2 days or as directed by your caregiver. Applying ice helps to reduce inflammation and pain. °· Put ice in a plastic bag. °· Place a towel between your skin and the bag. °· Leave the ice on for 15-20 minutes at a time, every 2 hours while you are awake. °· Only take over-the-counter or prescription medicines for pain, discomfort, or fever as directed by  your caregiver. °· Elevate your injured ankle above the level of your heart as much as possible for 2-3 days. °· If your caregiver recommends crutches, use them as instructed. Gradually put weight on the affected ankle. Continue to use crutches or a cane until you can walk without feeling pain in your ankle. °· If you have a plaster splint, wear the splint as directed by your caregiver. Do not rest it on anything harder than a pillow for the first 24 hours. Do not put weight on it. Do not get it wet. You may take it off to take a shower or bath. °· You may have been given an elastic bandage to wear around your ankle to provide support. If the elastic bandage is too tight (you have numbness or tingling in your foot or your foot becomes cold and blue), adjust the bandage to make it comfortable. °· If you have an air splint, you may blow more air into it or let air out to make it more comfortable. You may take your splint off at night and before taking a shower or bath. Wiggle your toes in the splint several times per day to decrease swelling. °SEEK MEDICAL CARE IF:  °· You have rapidly increasing bruising or swelling. °· Your toes feel extremely cold or you lose feeling in your foot. °· Your pain is not relieved with medicine. °SEEK IMMEDIATE MEDICAL CARE IF: °· Your toes are numb or blue. °·   You have severe pain that is increasing. °MAKE SURE YOU:  °· Understand these instructions. °· Will watch your condition. °· Will get help right away if you are not doing well or get worse. °  °This information is not intended to replace advice given to you by your health care provider. Make sure you discuss any questions you have with your health care provider. °  °Document Released: 01/28/2005 Document Revised: 02/18/2014 Document Reviewed: 02/09/2011 °Elsevier Interactive Patient Education ©2016 [employer] °RICE for Routine Care of Injuries °The routine care of many injuries includes rest, ice, compression, and elevation  (RICE therapy). RICE therapy is often recommended for injuries to soft tissues, such as a muscle strain, ligament injuries, bruises, and overuse injuries. It can also be used for some bony injuries. Using RICE therapy can help to relieve pain, lessen swelling, and enable your body to heal. °Rest °Rest is required to allow your body to heal. This usually involves reducing your normal activities and avoiding use of the injured part of your body. Generally, you can return to your normal activities when you are comfortable and have been given permission by your health care provider. °Ice °Icing your injury helps to keep the swelling down, and it lessens pain. Do not apply ice directly to your skin. °· Put ice in a plastic bag. °· Place a towel between your skin and the bag. °· Leave the ice on for 20 minutes, 2-3 times a day. °Do this for as long as you are directed by your health care provider. °Compression °Compression means putting pressure on the injured area. Compression helps to keep swelling down, gives support, and helps with discomfort. Compression may be done with an elastic bandage. If an elastic bandage has been applied, follow these general tips: °· Remove and reapply the bandage every 3-4 hours or as directed by your health care provider. °· Make sure the bandage is not wrapped too tightly, because this can cut off circulation. If part of your body beyond the bandage becomes blue, numb, cold, swollen, or more painful, your bandage is most likely too tight. If this occurs, remove your bandage and reapply it more loosely. °· See your health care provider if the bandage seems to be making your problems worse rather than better. °Elevation °Elevation means keeping the injured area raised. This helps to lessen swelling and decrease pain. If possible, your injured area should be elevated at or above the level of your heart or the center of your chest. °WHEN SHOULD I SEEK MEDICAL CARE? °You should seek medical  care if: °· Your pain and swelling continue. °· Your symptoms are getting worse rather than improving. °These symptoms may indicate that further evaluation or further X-rays are needed. Sometimes, X-rays may not show a small broken bone (fracture) until a number of days later. Make a follow-up appointment with your health care provider. °WHEN SHOULD I SEEK IMMEDIATE MEDICAL CARE? °You should seek immediate medical care if: °· You have sudden severe pain at or below the area of your injury. °· You have redness or increased swelling around your injury. °· You have tingling or numbness at or below the area of your injury that does not improve after you remove the elastic bandage. °  °This information is not intended to replace advice given to you by your health care provider. Make sure you discuss any questions you have with your health care provider. °  °Document Released: 05/12/2000 Document Revised: 10/19/2014 Document Reviewed: 01/05/2014 °Elsevier Interactive Patient Education ©2016 [employer] ° °

## 2015-02-16 ENCOUNTER — Telehealth: Payer: Self-pay

## 2015-02-16 ENCOUNTER — Ambulatory Visit: Payer: Medicaid Other | Admitting: Neurology

## 2015-02-16 NOTE — Telephone Encounter (Signed)
Patient did not show to appt today  

## 2015-02-24 ENCOUNTER — Encounter: Payer: Self-pay | Admitting: Neurology

## 2015-02-24 ENCOUNTER — Ambulatory Visit (INDEPENDENT_AMBULATORY_CARE_PROVIDER_SITE_OTHER): Payer: Medicaid Other | Admitting: Neurology

## 2015-02-24 VITALS — BP 130/72 | HR 76 | Resp 18 | Ht 65.0 in | Wt 199.0 lb

## 2015-02-24 DIAGNOSIS — F419 Anxiety disorder, unspecified: Secondary | ICD-10-CM

## 2015-02-24 DIAGNOSIS — G47411 Narcolepsy with cataplexy: Secondary | ICD-10-CM | POA: Diagnosis not present

## 2015-02-24 MED ORDER — IMIPRAMINE HCL 10 MG PO TABS: ORAL_TABLET | ORAL | Status: DC

## 2015-02-24 MED ORDER — MODAFINIL 100 MG PO TABS: 100.0000 mg | ORAL_TABLET | Freq: Two times a day (BID) | ORAL | Status: DC

## 2015-02-24 NOTE — Patient Instructions (Signed)
We will continue with Provigil generic 100 mg twice daily, if you take the second dose close to 3 PM, try just 1/2 pill for that 2nd dose.   We will keep your imipramine the same.   See your PCP for your anxiety and we may discuss increasing your imipramine at a later point.

## 2015-02-24 NOTE — Progress Notes (Signed)
Subjective:    Patient ID: Barbara [name] is a 20 y.o. female.  HPI     Interim history:   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 The patient is unaccompanied today. Of note, she no showed for an appointment on 02/16/2015. I last saw her on 11/16/2014, at which time we talked about her sleep test results. I felt, her history and sleep study findings were in keeping with narcolepsy with cataplexy. I suggested we start symptomatic treatment of her cataplexy with low-dose imipramine with titration. I also asked her to give [place] a phone call in about a month for an update and we would then start her on Provigil for symptomatic treatment of her daytime somnolence. I started her in November 2016 on Provigil 100 mg strength generic, one pill up to twice daily for daytime somnolence. She presented to the emergency room on 01/07/2015 after twisting her ankle. She felt that she had a cataplectic episode that resulted in a fall with twisted ankle. She had an x-ray of the right ankle which was negative for any acute process. She was treated conservatively.  Today, 02/24/2015: She reports doing fairly well with respect to her daytime somnolence and her cataplexy. She has had more anxiety however since stopping her Lexapro and Prozac but felt that neither one of them was working well for her. In fact, recently she felt she had a panic attack and had a cataplectic attack and fell and twisted her right ankle. She has always had problems with her right ankle since several years ago when she first twisted it. It always seems to be the right ankle that gets affected at any rate, she is doing better. She had to cancel her appointment on 02/16/2015 because she  felt sick. She had stomach pain and felt not she needed and had a headache but this improved. She has an appointment next week with her primary care provider to discuss anxiety medications and management. She does not see a psychiatrist. Her son will be 54 years old tomorrow. She is not planning on getting pregnant anytime soon.   Previously:  I first met her on 10/12/2014 at the request of Dr. Jannifer Franklin, at which time the patient reported intermittent short-lived episodes of left-sided weakness and a long-standing history of excessive daytime somnolence. I felt she had symptoms concerning for underlying narcolepsy with cataplexy. I invited her back for sleep study. She had an overnight polysomnogram followed by an MSLT the next day and I talked to her about her test results in detail today. Her baseline sleep study from 11/09/2014 showed a sleep efficiency mildly reduced at 83.6% with a latency to sleep of 41 minutes and wake after sleep onset of 32.5 minutes with mild to moderate sleep fragmentation noted. She had an elevated arousal index, she had an increased percentage of stage II sleep, a decreased percentage of slow-wave sleep and a decreased percentage of REM sleep at 13% with a normal REM latency. She had mild PLMS with mild arousals. She had very mild intermittent snoring. Total AHI was normal at 0.5 per hour, average oxygen saturation was 95%, nadir was 88%. She had a nap study the next day on 11/10/2014. Mean sleep latency for 4 naps was 2.5 minutes, she had two REM onset naps  during nap #2 and 3.   10/12/2014: Workup under Dr. Jannifer Franklin thus far has been nonrevealing. She had a brain MRI with and without contrast on 06/16/2013, which was reported as normal. She had an EEG on 09/07/2014: This is a minimally abnormal EEG recording secondary to mild transient left hemispheric theta slowing during periods of drowsiness. No epileptiform discharges were seen. Clinical correlation is required.  Dr. Jannifer Franklin  suggested consideration of Keppra next if sleep evaluation was negative.   I reviewed your office note from 08/31/2014. She reports a long-standing history of severe sleepiness even as a child. In school she had an IEP. This was primarily because she was sleeping in class. She has never had sleep study testing. She became depressed. She was treated with Lexapro and most recently in the last 6 months with Prozac which helped her depressive symptoms but not her sleepiness. She reports eating able to sleep all night long and all day long if possible. She has a 24-year-old son and lives with her husband, and her grandparents and has a stepson who stays off and on. She noticed episodes of weakness first when she became pregnant. Her episodes are very short lasting sometimes only seconds and she has a hard time keeping her eyes open. She's tried shaking her left side to make the weakness go away. Sometimes she has back-to-back episodes. Often these weakness episodes happen when she is not particularly physically active. Episodes can happen while watching TV. She has had episodes when she was laughing. Episodes of scary to her and she has no control over the weakness. This scared her particularly when her son was an infant and she had a hard time carrying him. She denies any sleep paralysis, she denies any hypnagogic hallucinations but has had occasional auditory hallucinations in the middle of the night. She had a brain MRI in May 2016 which was normal. I reviewed the results. She has no family history of narcolepsy. She goes to bed at different times but can go to bed as early as 5 PM. On average she will go to bed between 8 and 8:30 PM and rise time because of her young son is around 8 AM. Despite sleeping probably close to 10 hours each night she can nap several times a day. She does report dreaming and her naps. Her Epworth sleepiness score is 24 out of 24 today, her fatigue score is 36 out of 63. She does snore  occasionally. She denies any gasping sensations while asleep, nocturia, morning headaches, lower extremity swelling, or restless leg symptoms. Nevertheless, her husband has told her that she twitches in her sleep. She does not drink any alcohol, she does not smoke, she does not drink any caffeine and stop drinking caffeine when she got pregnant. She does not use any illicit drugs. 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 She works on Saturdays only, for a few hours. She has found it hard to stay awake at work. She does not have a driver's license yet but would like to get a license but has been fearful of trying to get a license because of her sleepiness.     Her Past Medical History Is Significant For: Past Medical History  Diagnosis Date  . Back pain   . Urinary tract infection     currently being treated w/Cephalexin  . Infection     UTI  . Hemiparesis ([place]) 08/31/2014  . Narcolepsy with cataplexy     Her Past Surgical History Is  Significant For: Past Surgical History  Procedure Laterality Date  . No past surgeries      Her Family History Is Significant For: Family History  Problem Relation Age of Onset  . Depression Mother   . Hypertension Maternal Grandmother   . Depression Maternal Grandmother   . Asthma Maternal Grandmother   . Diabetes Maternal Grandmother   . Heart disease Maternal Grandmother   . Hearing loss Maternal Grandfather   . Healthy Brother   . Healthy Sister     Her Social History Is Significant For: Social History   Social History  . Marital Status: Married    Spouse Name: N/A  . Number of Children: 1  . Years of Education: 11   Occupational History  . unemployed    Social History Main Topics  . Smoking status: Never Smoker   . Smokeless tobacco: Never Used  . Alcohol Use: No  . Drug Use: No  . Sexual Activity:    Partners: Male    Birth Control/ Protection: Injection     Comment: Depo Provera   Other Topics Concern  . None    Social History Narrative   Patient does not drink caffeine.   Patient is right handed.    Her Allergies Are:  Allergies  Allergen Reactions  . Corn-Containing Products Anaphylaxis and Swelling  . Other Itching and Nausea And Vomiting    SHERBET.  Marland Kitchen Percocet [Oxycodone-Acetaminophen] Other (See Comments)    States feels like chest is tightening up.  :   Her Current Medications Are:  Outpatient Encounter Prescriptions as of 02/24/2015  Medication Sig  . imipramine (TOFRANIL) 10 MG tablet 2 PILLS EACH NIGHT  . modafinil (PROVIGIL) 100 MG tablet Take 1 tablet (100 mg total) by mouth 2 (two) times daily. 2nd dose should be taken before 3pm  . [DISCONTINUED] imipramine (TOFRANIL) 10 MG tablet TAKE ONE PILL AT BEDTIME DAILY FOR 2 WEEKS, THEN INCREASE TO 2 PILLS EACH NIGHT THEREAFTER.  . [DISCONTINUED] modafinil (PROVIGIL) 100 MG tablet Take 1 tablet (100 mg total) by mouth 2 (two) times daily. 2nd dose should be taken before 3pm  . [DISCONTINUED] escitalopram (LEXAPRO) 20 MG tablet Take 1 tablet (20 mg total) by mouth daily. (Patient not taking: Reported on 11/16/2014)  . [DISCONTINUED] FLUoxetine (PROZAC) 20 MG capsule Take 20 mg by mouth daily.  . [DISCONTINUED] hydrocortisone (ANUSOL-HC) 25 MG suppository Place 1 suppository (25 mg total) rectally 2 (two) times daily. (Patient not taking: Reported on 11/16/2014)  . [DISCONTINUED] ibuprofen (ADVIL,MOTRIN) 800 MG tablet Take 1 tablet (800 mg total) by mouth 3 (three) times daily.  . [DISCONTINUED] metroNIDAZOLE (FLAGYL) 500 MG tablet Take 1 tablet (500 mg total) by mouth 2 (two) times daily. (Patient not taking: Reported on 11/16/2014)  . [DISCONTINUED] Multiple Vitamins-Minerals (MULTIVITAMIN WITH MINERALS) tablet Take 1 tablet by mouth daily.  . [DISCONTINUED] nitrofurantoin, macrocrystal-monohydrate, (MACROBID) 100 MG capsule Take 1 capsule (100 mg total) by mouth 2 (two) times daily. (Patient not taking: Reported on 11/16/2014)    Facility-Administered Encounter Medications as of 02/24/2015  Medication  . medroxyPROGESTERone (DEPO-PROVERA) injection 150 mg  :  Review of Systems:  Out of a complete 14 point review of systems, all are reviewed and negative with the exception of these symptoms as listed below:   Review of Systems  Neurological:       Patient feels like the Imipramine and Modafinil have helped her especially with episodes of weakness. With starting these meds she has stopped Lexapro and  Prozac but feels that her anxiety is worse. Recently had 2 panic attacks.     Objective:  Neurologic Exam  Physical Exam Physical Examination:   Filed Vitals:   02/24/15 0932  BP: 130/72  Pulse: 76  Resp: 18   General Examination: The patient is a very pleasant 20 y.o. female in no acute distress. She appears well-developed and well-nourished and adequately groomed. She is in good spirits today.   HEENT: Normocephalic, atraumatic, pupils are equal, round and reactive to light and accommodation. Extraocular tracking is good without limitation to gaze excursion or nystagmus noted. Normal smooth pursuit is noted. Hearing is grossly intact. Face is symmetric with normal facial animation and normal facial sensation. Speech is clear with no dysarthria noted. There is no hypophonia. There is no lip, neck/head, jaw or voice tremor. Neck is supple with full range of passive and active motion. There are no carotid bruits on auscultation. Oropharynx exam reveals: mild mouth dryness, adequate dental hygiene and mild airway crowding, due to redundant soft palate and tonsils in place, tonsils are about 1+ bilaterally, Mallampati is class II, tongue protrudes centrally and palate elevates symmetrically.    Chest: Clear to auscultation without wheezing, rhonchi or crackles noted.  Heart: S1+S2+0, regular and normal without murmurs, rubs or gallops noted.   Abdomen: Soft, non-tender and non-distended with normal bowel sounds  appreciated on auscultation.  Extremities: There is no pitting edema in the distal lower extremities bilaterally. Pedal pulses are intact.  Skin: Warm and dry without trophic changes noted. There are no varicose veins.  Musculoskeletal: exam reveals no obvious joint deformities, tenderness or joint swelling or erythema.   Neurologically:  Mental status: The patient is awake, alert and oriented in all 4 spheres. Her immediate and remote memory, attention, language skills and fund of knowledge are appropriate. There is no evidence of aphasia, agnosia, apraxia or anomia. Speech is clear with normal prosody and enunciation. Thought process is linear. Mood is normal and affect is normal.  Cranial nerves II - XII are as described above under HEENT exam. In addition: shoulder shrug is normal with equal shoulder height noted. Motor exam: Normal bulk, strength and tone is noted. There is no drift, tremor or rebound, no weakness. Romberg is negative. Reflexes are 2+ throughout. Babinski: Toes are flexor bilaterally. Fine motor skills and coordination: intact.  Cerebellar testing: No dysmetria or intention tremor on finger to nose testing. Heel to shin is unremarkable bilaterally. There is no truncal or gait ataxia.  Sensory exam: intact to light touch in the upper and lower extremities.  Gait, station and balance: She stands easily. Gait and station as well as tandem walk are unremarkable.   Assessment and Plan:   In summary, ANDREAL VULTAGGIO is a very pleasant 20 year old female with an underlying medical history of obesity, depression, and low back pain, who presents for follow-up consultation of her excessive daytime somnolence and episodic left-sided weakness for which she has had workup under Dr. Anne Hahn in 2016. She has a several year history of excessive daytime somnolence and history of episodic left-sided weakness which are brief and tend to happen particularly when she is anxious. Minor  abnormalities were noted on her recent EEG. She had a normal brain MRI. Her sleep study and MSLT results from 2016 showed restless sleep, no significant sleep disordered breathing, normal REM latency, mildly reduced REM percentage at night but significant daytime somnolence with a mean sleep latency of only 2.5 minutes and 2 REM  onset naps, and altogether, her history and polysomnographic testing are in keeping with narcolepsy with cataplexy. She continues to have a normal neurological and general physical exam and is reassured in that regard. She has been on imipramine 20 mg each night with good results, she has after this started Provigil 100 mg twice daily. She does report that sometimes when she starts feeling sleepy in the afternoon it is close to 3 PM but when she takes it then for her second dose, she has some trouble sleeping at night. Therefore, I suggested that she continue with the 100 mg in the morning which is typically around 10 AM as per her report and for the second dose if it is too close to 3 PM she is advised to try just half a pill which is 50 mg. I renewed her prescriptions for Provigil generic and imipramine today. For her anxiety, she is advised to talk to her primary care provider again. She has an appointment next week. We will keep her medications the same at this time. She is reasonably well treated and feels improved. She was wondering if she is eligible for disability for this diagnosis but I told her that since she is reasonably well treated and has improved, narcolepsy per se is not a diagnosis that would justify disability. I have several patients I explained to her that her successfully working full-time while on treatment symptomatically for narcolepsy and also for cataplexy in some cases.  I would like to see her back in about 4 months, sooner if needed. I answered all her questions today and she was in agreement. I spent 25 minutes in total face-to-face time with the patient,  more than 50% of which was spent in counseling and coordination of care, reviewing test results, reviewing medication and discussing or reviewing the diagnosis of narcolepsy with cataplexy, the prognosis and treatment options.

## 2015-06-26 ENCOUNTER — Ambulatory Visit: Payer: Medicaid Other | Admitting: Neurology

## 2015-06-26 ENCOUNTER — Telehealth: Payer: Self-pay

## 2015-06-26 NOTE — Telephone Encounter (Signed)
Patient did not show to appt today  

## 2015-06-27 ENCOUNTER — Encounter: Payer: Self-pay | Admitting: Neurology

## 2015-07-16 ENCOUNTER — Ambulatory Visit (HOSPITAL_COMMUNITY)
Admission: AD | Admit: 2015-07-16 | Discharge: 2015-07-16 | Disposition: A | Discharge status: Home / Self Care | Payer: Medicaid Other | Attending: Psychiatry | Admitting: Psychiatry

## 2015-07-16 DIAGNOSIS — G8190 Hemiplegia, unspecified affecting unspecified side: Secondary | ICD-10-CM | POA: Insufficient documentation

## 2015-07-16 DIAGNOSIS — F331 Major depressive disorder, recurrent, moderate: Secondary | ICD-10-CM | POA: Diagnosis not present

## 2015-07-16 DIAGNOSIS — G47411 Narcolepsy with cataplexy: Secondary | ICD-10-CM | POA: Insufficient documentation

## 2015-07-16 DIAGNOSIS — Z87440 Personal history of urinary (tract) infections: Secondary | ICD-10-CM | POA: Insufficient documentation
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
## 2015-09-19 ENCOUNTER — Ambulatory Visit (INDEPENDENT_AMBULATORY_CARE_PROVIDER_SITE_OTHER): Payer: Medicaid Other | Admitting: Neurology

## 2015-09-19 ENCOUNTER — Encounter: Payer: Self-pay | Admitting: Neurology

## 2015-09-19 VITALS — BP 110/66 | HR 82 | Resp 16 | Ht 65.0 in | Wt 206.0 lb

## 2015-09-19 DIAGNOSIS — F39 Unspecified mood [affective] disorder: Secondary | ICD-10-CM | POA: Diagnosis not present

## 2015-09-19 DIAGNOSIS — G47411 Narcolepsy with cataplexy: Secondary | ICD-10-CM

## 2015-09-19 MED ORDER — IMIPRAMINE HCL 10 MG PO TABS: ORAL_TABLET | ORAL | 5 refills | Status: DC

## 2015-09-19 MED ORDER — MODAFINIL 100 MG PO TABS: 100.0000 mg | ORAL_TABLET | Freq: Two times a day (BID) | ORAL | 5 refills | Status: DC

## 2015-09-19 NOTE — Patient Instructions (Addendum)
We will continue with your modafinil and imipramine. Your exam is stable.  I would agree that you see a therapist/counselor.  Follow up with us in 6 months. You can see Eber Jonesarolyn on [place], [place], next time and I will see you back after that.

## 2015-09-19 NOTE — Progress Notes (Signed)
Subjective:    Patient ID: Barbara [name] is a 20 y.o. female.  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 The patient is unaccompanied today. Of note, she missed an appointment on 06/26/2015. I last saw her on 02/24/2015, at which time she reported doing fairly well with respect to her daytime somnolence and her cataplexy. She had more anxiety issues however, since she stopped her Lexapro and Prozac but felt that neither one of them was working well for her. She reported recent panic attack and had a cataplectic attack at the time and fell and twisted her right ankle. She had issues with her right ankle since she first twisted it many years ago. She had to cancel a recent appointment because she was sick. She had some stomach pain and nausea and was supposed to see her primary care physician soon. I suggested we continue with Provigil generic 100 mg twice daily and imipramine 20 mg at night.   Today, 09/19/2015: She reports doing well. She had a beh health appointment in June and is in the process of seeing a Social worker. She did notice an increase in her cataplexy when she was off the imipramine for about 4 days (when she could no afford it). She feels the imipramine and modafinil are still helping. No SEs. Stays home with son, who is now nearly 1 yo.  Previously:  Of note, she no showed for an appointment on 02/16/2015. I last saw her on 11/16/2014, at which time we talked about her sleep test results. I felt, her history and sleep study findings were in keeping with narcolepsy with cataplexy. I suggested we start symptomatic treatment of her cataplexy with low-dose imipramine with titration. I also asked her to  give [place] a phone call in about a month for an update and we would then start her on Provigil for symptomatic treatment of her daytime somnolence. I started her in November 2016 on Provigil 100 mg strength generic, one pill up to twice daily for daytime somnolence. She presented to the emergency room on 01/07/2015 after twisting her ankle. She felt that she had a cataplectic episode that resulted in a fall with twisted ankle. She had an x-ray of the right ankle which was negative for any acute process. She was treated conservatively.  I first met her on 10/12/2014 at the request of Dr. Jannifer Franklin, at which time the patient reported intermittent short-lived episodes of left-sided weakness and a long-standing history of excessive daytime somnolence. I felt she had symptoms concerning for underlying narcolepsy with cataplexy. I invited her back for sleep study. She had an overnight polysomnogram followed by an MSLT the next day and I talked to her about her test results in detail today. Her baseline sleep study from 11/09/2014 showed a sleep efficiency mildly reduced at 83.6% with a latency to sleep of 41 minutes and wake after sleep onset of 32.5 minutes with mild to moderate sleep fragmentation noted. She had an elevated arousal index, she had an increased percentage of stage II sleep, a decreased percentage of slow-wave sleep and a decreased percentage of REM sleep at 13% with a normal REM latency. She had mild PLMS with mild arousals. She had very mild intermittent snoring. Total AHI  was normal at 0.5 per hour, average oxygen saturation was 95%, nadir was 88%. She had a nap study the next day on 11/10/2014. Mean sleep latency for 4 naps was 2.5 minutes, she had two REM onset naps during nap #2 and 3.   10/12/2014: Workup under Dr. Jannifer Franklin thus far has been nonrevealing. She had a brain MRI with and without contrast on 06/16/2013, which was reported as normal. She had an EEG on 09/07/2014: This is a minimally abnormal  EEG recording secondary to mild transient left hemispheric theta slowing during periods of drowsiness. No epileptiform discharges were seen. Clinical correlation is required.  Dr. Jannifer Franklin suggested consideration of Keppra next if sleep evaluation was negative.   I reviewed your office note from 08/31/2014. She reports a long-standing history of severe sleepiness even as a child. In school she had an IEP. This was primarily because she was sleeping in class. She has never had sleep study testing. She became depressed. She was treated with Lexapro and most recently in the last 6 months with Prozac which helped her depressive symptoms but not her sleepiness. She reports eating able to sleep all night long and all day long if possible. She has a 82-year-old son and lives with her husband, and her grandparents and has a stepson who stays off and on. She noticed episodes of weakness first when she became pregnant. Her episodes are very short lasting sometimes only seconds and she has a hard time keeping her eyes open. She's tried shaking her left side to make the weakness go away. Sometimes she has back-to-back episodes. Often these weakness episodes happen when she is not particularly physically active. Episodes can happen while watching TV. She has had episodes when she was laughing. Episodes of scary to her and she has no control over the weakness. This scared her particularly when her son was an infant and she had a hard time carrying him. She denies any sleep paralysis, she denies any hypnagogic hallucinations but has had occasional auditory hallucinations in the middle of the night. She had a brain MRI in May 2016 which was normal. I reviewed the results. She has no family history of narcolepsy. She goes to bed at different times but can go to bed as early as 5 PM. On average she will go to bed between 8 and 8:30 PM and rise time because of her young son is around 8 AM. Despite sleeping probably close to 10 hours  each night she can nap several times a day. She does report dreaming and her naps. Her Epworth sleepiness score is 24 out of 24 today, her fatigue score is 36 out of 63. She does snore occasionally. She denies any gasping sensations while asleep, nocturia, morning headaches, lower extremity swelling, or restless leg symptoms. Nevertheless, her husband has told her that she twitches in her sleep. She does not drink any alcohol, she does not smoke, she does not drink any caffeine and stop drinking caffeine when she got pregnant. She does not use any illicit drugs. 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 She works on Saturdays only, for a few hours. She has found it hard to stay awake at work. She does not have a driver's license yet but would like to get a license but has been fearful of trying to get a license because of her sleepiness.    Her Past Medical History Is Significant For: Past Medical History:  Diagnosis Date  . Back pain   .  Hemiparesis ([place]) 08/31/2014  . Infection    UTI  . Narcolepsy with cataplexy   . Urinary tract infection    currently being treated w/Cephalexin    Her Past Surgical History Is Significant For: Past Surgical History:  Procedure Laterality Date  . NO PAST SURGERIES      Her Family History Is Significant For: Family History  Problem Relation Age of Onset  . Depression Mother   . Hypertension Maternal Grandmother   . Depression Maternal Grandmother   . Asthma Maternal Grandmother   . Diabetes Maternal Grandmother   . Heart disease Maternal Grandmother   . Hearing loss Maternal Grandfather   . Healthy Brother   . Healthy Sister     Her Social History Is Significant For: Social History   Social History  . Marital status: Married    Spouse name: N/A  . Number of children: 1  . Years of education: 52   Occupational History  . unemployed    Social History Main Topics  . Smoking status: Never Smoker  . Smokeless tobacco: Never Used  .  Alcohol use No  . Drug use: No  . Sexual activity: Yes    Partners: Male    Birth control/ protection: Injection     Comment: Depo Provera   Other Topics Concern  . None   Social History Narrative   Patient does not drink caffeine.   Patient is right handed.    Her Allergies Are:  Allergies  Allergen Reactions  . Corn-Containing Products Anaphylaxis and Swelling  . Other Itching and Nausea And Vomiting    SHERBET.  Marland Kitchen Percocet [Oxycodone-Acetaminophen] Other (See Comments)    States feels like chest is tightening up.  :   Her Current Medications Are:  Outpatient Encounter Prescriptions as of 09/19/2015  Medication Sig  . imipramine (TOFRANIL) 10 MG tablet 2 PILLS EACH NIGHT  . modafinil (PROVIGIL) 100 MG tablet Take 1 tablet (100 mg total) by mouth 2 (two) times daily. 2nd dose should be taken before 3pm  . sertraline (ZOLOFT) 50 MG tablet Take by mouth.   Facility-Administered Encounter Medications as of 09/19/2015  Medication  . medroxyPROGESTERone (DEPO-PROVERA) injection 150 mg  :  Review of Systems:  Out of a complete 14 point review of systems, all are reviewed and negative with the exception of these symptoms as listed below:  Review of Systems  Neurological:       No new concerns per patient. She feels that the Modafinil helps her. Patient has physical with PCP in October.     Objective:  Neurologic Exam  Physical Exam Physical Examination:   Vitals:   09/19/15 0923  BP: 110/66  Pulse: 82  Resp: 16   General Examination: The patient is a very pleasant 20 y.o. female in no acute distress. She appears well-developed and well-nourished and adequately groomed. She is in good spirits today.   HEENT: Normocephalic, atraumatic, pupils are equal, round and reactive to light and accommodation. Extraocular tracking is good without limitation to gaze excursion or nystagmus noted. Normal smooth pursuit is noted. Hearing is grossly intact. Face is symmetric with  normal facial animation and normal facial sensation. Speech is clear with no dysarthria noted. There is no hypophonia. There is no lip, neck/head, jaw or voice tremor. Neck is supple with full range of passive and active motion. There are no carotid bruits on auscultation. Oropharynx exam reveals: mild mouth dryness, adequate dental hygiene and mild airway crowding, due to redundant soft  palate and tonsils in place, tonsils are about 1+ bilaterally, Mallampati is class II, tongue protrudes centrally and palate elevates symmetrically.    Chest: Clear to auscultation without wheezing, rhonchi or crackles noted.  Heart: S1+S2+0, regular and normal without murmurs, rubs or gallops noted.   Abdomen: Soft, non-tender and non-distended with normal bowel sounds appreciated on auscultation.  Extremities: There is no pitting edema in the distal lower extremities bilaterally. Pedal pulses are intact.  Skin: Warm and dry without trophic changes noted. There are no varicose veins.  Musculoskeletal: exam reveals no obvious joint deformities, tenderness or joint swelling or erythema.   Neurologically:  Mental status: The patient is awake, alert and oriented in all 4 spheres. Her immediate and remote memory, attention, language skills and fund of knowledge are appropriate. There is no evidence of aphasia, agnosia, apraxia or anomia. Speech is clear with normal prosody and enunciation. Thought process is linear. Mood is normal and affect is normal.  Cranial nerves II - XII are as described above under HEENT exam. In addition: shoulder shrug is normal with equal shoulder height noted. Motor exam: Normal bulk, strength and tone is noted. There is no drift, tremor or rebound, no weakness. Romberg is negative. Reflexes are 2+ throughout. Babinski: Toes are flexor bilaterally. Fine motor skills and coordination: intact.  Cerebellar testing: No dysmetria or intention tremor on finger to nose testing. Heel to shin is  unremarkable bilaterally. There is no truncal or gait ataxia.  Sensory exam: intact to light touch, PP, temp and vibration in the upper and lower extremities.  Gait, station and balance: She stands easily. Gait and station as well as tandem walk are unremarkable.   Assessment and Plan:   In summary, DESTYNI HOPPEL is a very pleasant 20 year old female with an underlying medical history of obesity, depression, and low back pain, who presents for follow-up consultation of her excessive daytime somnolence and episodic left-sided weakness for which she has had workup under Dr. Anne Hahn in 2016. She has a several year history of excessive daytime somnolence and history of episodic left-sided weakness which are brief and tend to happen particularly when she is anxious or excited. Minor abnormalities were noted on her EEG. She had a normal brain MRI. Her sleep study and MSLT results from 2016 showed restless sleep, no significant sleep disordered breathing, normal REM latency, mildly reduced REM percentage at night but significant daytime somnolence with a mean sleep latency of only 2.5 minutes and 2 REM onset naps. Her history and polysomnographic test results support the Dx of narcolepsy with cataplexy. She continues to have a normal neurological and general physical exam and is reassured in that regard. She has been on imipramine 20 mg each night with good results, she has also been on generic Provigil 100 mg twice daily. I suggested that she continue with the 100 mg in the morning which is typically around 10 AM as per her report and for the second dose if it is too close to 3 PM she is advised to try just half a pill which is 50 mg. I renewed her prescriptions for Provigil generic and imipramine today. She does report occasional headaches, not to the point where she typically has to take medication for headaches but sometimes takes an aspirin. She is in the process of establishing care with a counselor, I think  this is a good idea for her mood disorder. I suggested a 6 month follow-up, she can see one of our nurse  practitioners at the time as she is doing well from my end of things, she is in agreement. I answered all her questions today. I spent 25 minutes in total face-to-face time with the patient, more than 50% of which was spent in counseling and coordination of care, reviewing test results, reviewing medication and discussing or reviewing the diagnosis of narcolepsy with cataplexy, the prognosis and treatment options.

## 2015-12-03 ENCOUNTER — Other Ambulatory Visit: Payer: Self-pay | Admitting: Neurology

## 2015-12-03 DIAGNOSIS — G47411 Narcolepsy with cataplexy: Secondary | ICD-10-CM

## 2015-12-29 ENCOUNTER — Telehealth: Payer: Self-pay | Admitting: Neurology

## 2015-12-29 DIAGNOSIS — G47411 Narcolepsy with cataplexy: Secondary | ICD-10-CM

## 2015-12-29 NOTE — Telephone Encounter (Signed)
Received PA from pharmacy about modafinil today.

## 2015-12-29 NOTE — Telephone Encounter (Addendum)
Patient called regarding modafinil (PROVIGIL) 100 MG tablet, was advised by [facility], Medicaid is requiring PA for this medication. Please call [phone].

## 2015-12-29 NOTE — Telephone Encounter (Signed)
Attempted to call pt and she is not available now.  Could not LM.

## 2016-01-01 NOTE — Telephone Encounter (Signed)
Her insurance prefers name brand Provigil, ok to change?

## 2016-01-02 MED ORDER — PROVIGIL 100 MG PO TABS: 100.0000 mg | ORAL_TABLET | Freq: Two times a day (BID) | ORAL | 5 refills | Status: DC

## 2016-01-02 NOTE — Telephone Encounter (Signed)
Modafinil changed to Brand name Provigil per ins formulary.

## 2016-01-02 NOTE — Telephone Encounter (Signed)
I called and LM for patient advising her of change below. [facility]

## 2016-01-04 ENCOUNTER — Emergency Department (HOSPITAL_COMMUNITY)
Admission: EM | Admit: 2016-01-04 | Discharge: 2016-01-04 | Disposition: A | Discharge status: Home / Self Care | Payer: Medicaid Other | Attending: Emergency Medicine | Admitting: Emergency Medicine

## 2016-01-04 ENCOUNTER — Encounter (HOSPITAL_COMMUNITY): Payer: Self-pay | Admitting: Emergency Medicine

## 2016-01-04 DIAGNOSIS — Y929 Unspecified place or not applicable: Secondary | ICD-10-CM | POA: Insufficient documentation

## 2016-01-04 DIAGNOSIS — Y999 Unspecified external cause status: Secondary | ICD-10-CM | POA: Insufficient documentation

## 2016-01-04 DIAGNOSIS — S3992XA Unspecified injury of lower back, initial encounter: Secondary | ICD-10-CM | POA: Diagnosis present

## 2016-01-04 DIAGNOSIS — X509XXA Other and unspecified overexertion or strenuous movements or postures, initial encounter: Secondary | ICD-10-CM | POA: Insufficient documentation

## 2016-01-04 DIAGNOSIS — Y9389 Activity, other specified: Secondary | ICD-10-CM | POA: Insufficient documentation

## 2016-01-04 DIAGNOSIS — S39012A Strain of muscle, fascia and tendon of lower back, initial encounter: Secondary | ICD-10-CM

## 2016-01-04 DIAGNOSIS — Z79899 Other long term (current) drug therapy: Secondary | ICD-10-CM | POA: Diagnosis not present

## 2016-01-04 LAB — URINALYSIS, ROUTINE W REFLEX MICROSCOPIC
Bilirubin Urine: NEGATIVE
Glucose, UA: NEGATIVE mg/dL
Hgb urine dipstick: NEGATIVE
KETONES UR: NEGATIVE mg/dL
NITRITE: NEGATIVE
PH: 6.5 (ref 5.0–8.0)
PROTEIN: NEGATIVE mg/dL
Specific Gravity, Urine: 1.028 (ref 1.005–1.030)

## 2016-01-04 LAB — POC URINE PREG, ED: Preg Test, Ur: NEGATIVE

## 2016-01-04 LAB — URINE MICROSCOPIC-ADD ON

## 2016-01-04 MED ORDER — METHOCARBAMOL 500 MG PO TABS: 500.0000 mg | ORAL_TABLET | Freq: Two times a day (BID) | ORAL | 0 refills | Status: DC

## 2016-01-04 MED ORDER — DICLOFENAC SODIUM 50 MG PO TBEC: 50.0000 mg | DELAYED_RELEASE_TABLET | Freq: Two times a day (BID) | ORAL | 0 refills | Status: DC

## 2016-01-04 NOTE — ED Triage Notes (Signed)
Pt presents to ED for assessment of right sided back pain.  Pt sts pain worsens with movement, especially sitting, standing and lifting.  Pt sts hx of UTIs, but denies any urinary symptoms at this time.

## 2016-01-04 NOTE — ED Provider Notes (Signed)
[facility]-EMERGENCY DEPT Provider Note   CSN: [id number] Arrival date & time: 01/04/16  1611   By signing my name below, I, Clovis Pu, attest that this documentation has been prepared under the direction and in the presence of  Kerrie Buffalo, NP. Electronically Signed: Clovis Pu, ED Scribe. 01/04/16. 4:56 PM.   History   Chief Complaint Chief Complaint  Patient presents with  . Back Pain    The history is provided by the patient. No language interpreter was used.  Back Pain   This is a recurrent problem. The current episode started more than 1 week ago. The problem occurs constantly. The problem has not changed since onset.The pain is associated with no known injury. Quality: sharp. The pain does not radiate. The pain is moderate. The symptoms are aggravated by bending. Pertinent negatives include no numbness, no abdominal pain and no dysuria. She has tried NSAIDs for the symptoms. The treatment provided mild relief.   HPI Comments:  Barbara [name] is a 20 y.o. female, with a hx of back pain, who presents to the Emergency Department complaining of gradually worsening, persistent, sharp lower right sided back pain x 1 week. She notes her pain worsened today. Her pain is worse when laying down and with certain movements. She has taken ibuprofen with little relief. Pt states she thinks she currently has a UTI because of smelly urine. Pt denies radiation of the pain, any recent heavy lifting, recent injury, abdominal pain, dysuria, hematuria any other associated symptoms or modifying factors at this time.   Past Medical History:  Diagnosis Date  . Back pain   . Hemiparesis ([facility]) 08/31/2014  . Infection    UTI  . Narcolepsy with cataplexy   . Urinary tract infection    currently being treated w/Cephalexin    Patient Active Problem List   Diagnosis Date Noted  . Hemiparesis ([facility]) 08/31/2014  . Depressed mood with postpartum onset 01/18/2014    Past Surgical History:  Procedure  Laterality Date  . NO PAST SURGERIES      OB History    Gravida Para Term Preterm AB Living   1 1 1     1    SAB TAB Ectopic Multiple Live Births           1       Home Medications    Prior to Admission medications   Medication Sig Start Date End Date Taking? Authorizing Provider  diclofenac (VOLTAREN) 50 MG EC tablet Take 1 tablet (50 mg total) by mouth 2 (two) times daily. 01/04/16   Merryl Buckels Orlene Och, NP  imipramine (TOFRANIL) 10 MG tablet 2 PILLS EACH NIGHT 09/19/15   Huston Foley, MD  methocarbamol (ROBAXIN) 500 MG tablet Take 1 tablet (500 mg total) by mouth 2 (two) times daily. 01/04/16   Zong Mcquarrie Orlene Och, NP  PROVIGIL 100 MG tablet Take 1 tablet (100 mg total) by mouth 2 (two) times daily. 01/02/16   Huston Foley, MD  sertraline (ZOLOFT) 50 MG tablet Take by mouth. 09/04/15 09/03/16  Historical Provider, MD    Family History Family History  Problem Relation Age of Onset  . Depression Mother   . Healthy Brother   . Healthy Sister   . Hypertension Maternal Grandmother   . Depression Maternal Grandmother   . Asthma Maternal Grandmother   . Diabetes Maternal Grandmother   . Heart disease Maternal Grandmother   . Hearing loss Maternal Grandfather     Social History Social History  Substance Use  Topics  . Smoking status: Never Smoker  . Smokeless tobacco: Never Used  . Alcohol use No     Allergies   Corn-containing products; Other; and Percocet [oxycodone-acetaminophen]   Review of Systems Review of Systems  Gastrointestinal: Negative for abdominal pain.  Genitourinary: Negative for dysuria and hematuria.  Musculoskeletal: Positive for back pain. Negative for gait problem.  Neurological: Negative for numbness.     Physical Exam Updated Vital Signs BP 128/74 (BP Location: Left Arm)   Pulse 96   Temp 98.1 F (36.7 C) (Oral)   Resp 16   LMP 11/04/2015   SpO2 100%   Physical Exam  Constitutional: She is oriented to person, place, and time. She appears  well-developed and well-nourished. No distress.  HENT:  Head: Normocephalic and atraumatic.  Nose: Nose normal.  Eyes: Conjunctivae and EOM are normal.  Neck: Normal range of motion. Neck supple.  Cardiovascular: Normal rate and regular rhythm.   Pulmonary/Chest: Effort normal. She has no wheezes. She has no rales.  Abdominal: Soft. Bowel sounds are normal. There is no tenderness.  Musculoskeletal: Normal range of motion. She exhibits no edema or deformity.       Lumbar back: She exhibits tenderness, pain and spasm. She exhibits normal range of motion and normal pulse.  Neurological: She is alert and oriented to person, place, and time. She has normal strength. No cranial nerve deficit or sensory deficit. Gait normal.  Reflex Scores:      Bicep reflexes are 2+ on the right side and 2+ on the left side.      Brachioradialis reflexes are 2+ on the right side and 2+ on the left side.      Patellar reflexes are 2+ on the right side and 2+ on the left side.      Achilles reflexes are 2+ on the right side and 2+ on the left side. Ambulatory with steady gait. No foot drag. Reflexes normal and symmetric. Straight leg raises without difficulty. No tenderness over cervical, thoracic or lumbar spine.   Skin: Skin is warm and dry.  Psychiatric: She has a normal mood and affect. Her behavior is normal.  Nursing note and vitals reviewed.  ED Treatments / Results  DIAGNOSTIC STUDIES:  Oxygen Saturation is 100% on RA, normal by my interpretation.    COORDINATION OF CARE:  4:34 PM Discussed treatment plan with pt at bedside and pt agreed to plan.  Labs (all labs ordered are listed, but only abnormal results are displayed) Labs Reviewed  URINALYSIS, ROUTINE W REFLEX MICROSCOPIC (NOT AT [facility]) - Abnormal; Notable for the following:       Result Value   Leukocytes, UA SMALL (*)    All other components within normal limits  URINE MICROSCOPIC-ADD ON - Abnormal; Notable for the following:    Squamous  Epithelial / LPF 6-30 (*)    Bacteria, UA FEW (*)    All other components within normal limits  POC URINE PREG, ED   Radiology No results found.  Procedures Procedures (including critical care time)  Medications Ordered in ED Medications - No data to display   Initial Impression / Assessment and Plan / ED Course  I have reviewed the triage vital signs and the nursing notes.  Pertinent lab results that were available during my care of the patient were reviewed by me and considered in my medical decision making (see chart for details).  Clinical Course     Patient with back pain.  No neurological deficits and  normal neuro exam.  Patient is ambulatory.  No loss of bowel or bladder control.  No concern for cauda equina.  No fever, night sweats, weight loss, h/o cancer, IVDA, no recent procedure to back. Normal urine. Supportive care and return precaution discussed. Appears safe for discharge at this time. Follow up as indicated in discharge paperwork.    Final Clinical Impressions(s) / ED Diagnoses   Final diagnoses:  Strain of lumbar region, initial encounter    New Prescriptions Discharge Medication List as of 01/04/2016  6:25 PM    START taking these medications   Details  diclofenac (VOLTAREN) 50 MG EC tablet Take 1 tablet (50 mg total) by mouth 2 (two) times daily., Starting Thu 01/04/2016, Print    methocarbamol (ROBAXIN) 500 MG tablet Take 1 tablet (500 mg total) by mouth 2 (two) times daily., Starting Thu 01/04/2016, Print      I personally performed the services described in this documentation, which was scribed in my presence. The recorded information has been reviewed and is accurate.     [place]son FlatsHope M Dammon Makarewicz, NP 01/05/16 0201    Nira ConnPedro Eduardo Cardama, MD 01/05/16 [phone]

## 2016-01-08 NOTE — Telephone Encounter (Signed)
PA needed for Provigil, Provigil is apoved medication for Narcolepsy. PA approved today, PA # [id number]. Approval faxed to pharmacy.

## 2016-03-08 ENCOUNTER — Other Ambulatory Visit: Payer: Self-pay | Admitting: Neurology

## 2016-03-08 DIAGNOSIS — G47411 Narcolepsy with cataplexy: Secondary | ICD-10-CM

## 2016-03-20 ENCOUNTER — Telehealth: Payer: Self-pay

## 2016-03-20 DIAGNOSIS — G47411 Narcolepsy with cataplexy: Secondary | ICD-10-CM

## 2016-03-20 NOTE — Telephone Encounter (Signed)
I called pt, a man answered and told me that this pt will not be available until after 5pm and he did not know the pt's cell phone number.  I will try again after 5 pm.

## 2016-03-20 NOTE — Telephone Encounter (Signed)
I called pt. It went to her VM. I left message asking her to please call us back.

## 2016-03-21 ENCOUNTER — Ambulatory Visit: Payer: Medicaid Other | Admitting: Adult Health

## 2016-03-21 MED ORDER — IMIPRAMINE HCL 10 MG PO TABS: ORAL_TABLET | ORAL | 0 refills | Status: DC

## 2016-03-21 NOTE — Telephone Encounter (Signed)
[facility] [address] A pa for imipramine is not needed. It was refilled for one month, until her appt with Aundra MilletMegan, NP.  I called pt and advised her of this. Pt verbalized understanding.

## 2016-03-21 NOTE — Telephone Encounter (Signed)
Pt [facility],since [facility] out ill.  I rescheduled appt for 05-09-16 1130.  Pt also needs her tofranil which she says is requiring PA.  [facility] [address] [facility] [address].  No change in insurance.  I forwarded to [place] FarmKristen, Charity fundraiserN.

## 2016-03-29 ENCOUNTER — Other Ambulatory Visit: Payer: Self-pay | Admitting: Neurology

## 2016-04-01 ENCOUNTER — Other Ambulatory Visit: Payer: Self-pay | Admitting: Neurology

## 2016-04-01 ENCOUNTER — Telehealth: Payer: Self-pay

## 2016-04-01 MED ORDER — PROVIGIL 100 MG PO TABS: 100.0000 mg | ORAL_TABLET | Freq: Two times a day (BID) | ORAL | 5 refills | Status: DC

## 2016-04-01 NOTE — Telephone Encounter (Signed)
I called [facility]. The RX for provigil was last written on 01/02/2016 with 5 refills. Pt should not need another refill. [facility] reports to me that the RX they have on file is from 09/19/2015, they did not receive the RX from 01/02/2016.  Will print for Dr. Frances FurbishAthar to sign. Pt is up to date on appts.

## 2016-04-01 NOTE — Telephone Encounter (Signed)
[facility] for [facility]igil [facility]ed to [facility]. Received a receipt of confirmation.  I called pt to advise her of this. No answer, left a message asking her to call me back. If pt calls back, please advise her of this information.

## 2016-04-01 NOTE — Telephone Encounter (Signed)
PROVIGIL 100 MG tablet  [facility] [address].  Pt states this med has been requested twice by phamracy and she is still waiting .

## 2016-04-01 NOTE — Telephone Encounter (Signed)
Advised patient of previous message. °

## 2016-04-08 NOTE — Telephone Encounter (Signed)
[facility] states [facility] [address] [facility] PROVIGIL 100 MG tablet. Patient would like a call back.

## 2016-04-08 NOTE — Telephone Encounter (Signed)
[facility]. A pa for provigil was completed in November of 2017. A new pa should not be needed.  However, [facility] says that the provigil keeps rejecting saying that a new pa is needed.  I called [place] [employer], spoke to [place]. Spent greater than 10 minutes on the phone with her. The pa for provigil is still valid but the pharmacy is [facility] that has been terminated. They need to use the correct code. If the pharmacy has more trouble with this they need to call  Medicaid directly at [phone], call ref # [id number]. PA # [id number].  [facility] and [facility] went through.  I called pt and advised her of this information. Pt verbalized understanding.

## 2016-05-09 ENCOUNTER — Ambulatory Visit: Payer: Self-pay | Admitting: Adult Health

## 2016-05-10 ENCOUNTER — Encounter: Payer: Self-pay | Admitting: Adult Health

## 2016-07-04 ENCOUNTER — Encounter: Payer: Self-pay | Admitting: Neurology

## 2016-07-04 ENCOUNTER — Ambulatory Visit (INDEPENDENT_AMBULATORY_CARE_PROVIDER_SITE_OTHER): Payer: Medicaid Other | Admitting: Neurology

## 2016-07-04 ENCOUNTER — Encounter (INDEPENDENT_AMBULATORY_CARE_PROVIDER_SITE_OTHER): Payer: Self-pay

## 2016-07-04 VITALS — BP 112/78 | HR 88 | Resp 16 | Ht 65.0 in | Wt 208.0 lb

## 2016-07-04 DIAGNOSIS — G47411 Narcolepsy with cataplexy: Secondary | ICD-10-CM | POA: Diagnosis not present

## 2016-07-04 MED ORDER — IMIPRAMINE HCL 10 MG PO TABS: ORAL_TABLET | ORAL | 5 refills | Status: DC

## 2016-07-04 MED ORDER — PROVIGIL 200 MG PO TABS: 200.0000 mg | ORAL_TABLET | Freq: Every day | ORAL | 5 refills | Status: DC

## 2016-07-04 NOTE — Patient Instructions (Signed)
Please take your medications AS DIRECTED! YOU HAVE TO BE UPFRONT WITH ME, so I can help you better and I need to know if your meds are not working. You have to be compliant with medications and your appointments.   As discussed, I will increase your Provigil Brand name to 200 mg twice daily, which is the maximum dose! Do not increase this any further!  I will restart your Imipramine at 20 mg each night, which is 2 pills each night, and you can increase after about a week to 3 pills each night.   Follow up in 3 months with nurse practitioner, call for questions or concerns.

## 2016-07-04 NOTE — Progress Notes (Signed)
Subjective:    Patient ID: Barbara [name] is a 21 y.o. female.  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 The patient is unaccompanied today. Of note, she no showed for an appointment with Ward Givens on 05/09/2016. I last saw her on 09/19/15, at which time I suggested she continue with imipramine and modafinil. She was followed by behavioral health and was seeing a counselor for her mood disorder.   Today, 07/04/2016: She reports that the medications have not been working well for her. Of note, she claims that she has been taking them regularly. She has actually not been on imipramine for the past 2 months at least. It was last filled in February for 1 month supply. She admits that she had run out of it and that her weakness episodes increased after that. She becomes tearful at the time. She also admits that she had doubled up on the Provigil. She ran out of it and could not fill it until it was time to fill it. She recently filled it on 07/01/2016. She also reports that the brand name Provigil does not work as well as the modafinil had worked for her but her insurance requires brand-name Provigil. 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 She reports that she feels she has to be extra alert at work and therefore had increased her Provigil. She is not on Zoloft or Robaxin. We called her pharmacy to verify prescription fill dates for her Provigil and imipramine. [facility] last [facility] Provigil 4/23 and 5/21, Tofranil was last filled 2/8.   The patient's allergies, current medications, family history, past medical history, past  social history, past surgical history and problem list were reviewed and updated as appropriate.   Previously (copied from previous notes for reference):   Of note, she missed an appointment on 06/26/2015. I saw her on 02/24/2015, at which time she reported doing fairly well with respect to her daytime somnolence and her cataplexy. She had more anxiety issues however, since she stopped her Lexapro and Prozac but felt that neither one of them was working well for her. She reported recent panic attack and had a cataplectic attack at the time and fell and twisted her right ankle. She had issues with her right ankle since she first twisted it many years ago. She had to cancel a recent appointment because she was sick. She had some stomach pain and nausea and was supposed to see her primary care physician soon. I suggested we continue with Provigil generic 100 mg twice daily and imipramine 20 mg at night.    Of note, she no showed for an appointment on 02/16/2015. I last saw her on 11/16/2014, at which time we talked about her sleep test results. I felt, her history and sleep study findings were in keeping with narcolepsy with cataplexy. I suggested we start symptomatic treatment of her cataplexy with low-dose imipramine with titration. I also asked her to give [place] a phone call in about a month for an update and we would then start her on Provigil for symptomatic treatment of her daytime somnolence. I started her in November  2016 on Provigil 100 mg strength generic, one pill up to twice daily for daytime somnolence. She presented to the emergency room on 01/07/2015 after twisting her ankle. She felt that she had a cataplectic episode that resulted in a fall with twisted ankle. She had an x-ray of the right ankle which was negative for any acute process. She was treated conservatively.   I first met her on 10/12/2014 at the request of Dr. Jannifer Franklin, at which time the patient reported intermittent short-lived episodes  of left-sided weakness and a long-standing history of excessive daytime somnolence. I felt she had symptoms concerning for underlying narcolepsy with cataplexy. I invited her back for sleep study. She had an overnight polysomnogram followed by an MSLT the next day and I talked to her about her test results in detail today. Her baseline sleep study from 11/09/2014 showed a sleep efficiency mildly reduced at 83.6% with a latency to sleep of 41 minutes and wake after sleep onset of 32.5 minutes with mild to moderate sleep fragmentation noted. She had an elevated arousal index, she had an increased percentage of stage II sleep, a decreased percentage of slow-wave sleep and a decreased percentage of REM sleep at 13% with a normal REM latency. She had mild PLMS with mild arousals. She had very mild intermittent snoring. Total AHI was normal at 0.5 per hour, average oxygen saturation was 95%, nadir was 88%. She had a nap study the next day on 11/10/2014. Mean sleep latency for 4 naps was 2.5 minutes, she had two REM onset naps during nap #2 and 3.    10/12/2014: Workup under Dr. Jannifer Franklin thus far has been nonrevealing. She had a brain MRI with and without contrast on 06/16/2013, which was reported as normal. She had an EEG on 09/07/2014: This is a minimally abnormal EEG recording secondary to mild transient left hemispheric theta slowing during periods of drowsiness. No epileptiform discharges were seen. Clinical correlation is required.   Dr. Jannifer Franklin suggested consideration of Keppra next if sleep evaluation was negative.   I reviewed your office note from 08/31/2014. She reports a long-standing history of severe sleepiness even as a child. In school she had an IEP. This was primarily because she was sleeping in class. She has never had sleep study testing. She became depressed. She was treated with Lexapro and most recently in the last 6 months with Prozac which helped her depressive symptoms but not her sleepiness.  She reports eating able to sleep all night long and all day long if possible. She has a 44-year-old son and lives with her husband, and her grandparents and has a stepson who stays off and on. She noticed episodes of weakness first when she became pregnant. Her episodes are very short lasting sometimes only seconds and she has a hard time keeping her eyes open. She's tried shaking her left side to make the weakness go away. Sometimes she has back-to-back episodes. Often these weakness episodes happen when she is not particularly physically active. Episodes can happen while watching TV. She has had episodes when she was laughing. Episodes of scary to her and she has no control over the weakness. This scared her particularly when her son was an infant and she had a hard time carrying him. She denies any sleep paralysis, she denies any hypnagogic hallucinations but has had occasional auditory hallucinations in the middle of the night. She had a brain MRI in May 2016 which was normal. I reviewed the results. She has no family  history of narcolepsy. She goes to bed at different times but can go to bed as early as 5 PM. On average she will go to bed between 8 and 8:30 PM and rise time because of her young son is around 8 AM. Despite sleeping probably close to 10 hours each night she can nap several times a day. She does report dreaming and her naps. Her Epworth sleepiness score is 24 out of 24 today, her fatigue score is 36 out of 63. She does snore occasionally. She denies any gasping sensations while asleep, nocturia, morning headaches, lower extremity swelling, or restless leg symptoms. Nevertheless, her husband has told her that she twitches in her sleep. She does not drink any alcohol, she does not smoke, she does not drink any caffeine and stop drinking caffeine when she got pregnant. She does not use any illicit drugs. 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 She works on Saturdays only, for a few hours.  She has found it hard to stay awake at work. She does not have a driver's license yet but would like to get a license but has been fearful of trying to get a license because of her sleepiness.      Her Past Medical History Is Significant For: Past Medical History:  Diagnosis Date  . Back pain   . Hemiparesis ([place]) 08/31/2014  . Infection    UTI  . Narcolepsy with cataplexy   . Urinary tract infection    currently being treated w/Cephalexin    Her Past Surgical History Is Significant For: Past Surgical History:  Procedure Laterality Date  . NO PAST SURGERIES      Her Family History Is Significant For: Family History  Problem Relation Age of Onset  . Depression Mother   . Healthy Brother   . Healthy Sister   . Hypertension Maternal Grandmother   . Depression Maternal Grandmother   . Asthma Maternal Grandmother   . Diabetes Maternal Grandmother   . Heart disease Maternal Grandmother   . Hearing loss Maternal Grandfather     Her Social History Is Significant For: Social History   Social History  . Marital status: Married    Spouse name: N/A  . Number of children: 1  . Years of education: 55   Occupational History  . unemployed    Social History Main Topics  . Smoking status: Never Smoker  . Smokeless tobacco: Never Used  . Alcohol use No  . Drug use: No  . Sexual activity: Yes    Partners: Male    Birth control/ protection: Injection     Comment: Depo Provera   Other Topics Concern  . None   Social History Narrative   Patient does not drink caffeine.   Patient is right handed.    Her Allergies Are:  Allergies  Allergen Reactions  . Corn-Containing Products Anaphylaxis and Swelling  . Other Itching and Nausea And Vomiting    SHERBET.  Marland Kitchen Percocet [Oxycodone-Acetaminophen] Other (See Comments)    States feels like chest is tightening up.  :   Her Current Medications Are:  Outpatient Encounter Prescriptions as of 07/04/2016  Medication Sig  .  diclofenac (VOLTAREN) 50 MG EC tablet Take 1 tablet (50 mg total) by mouth 2 (two) times daily.  Marland Kitchen imipramine (TOFRANIL) 10 MG tablet 2 PILLS EACH NIGHT  . methocarbamol (ROBAXIN) 500 MG tablet Take 1 tablet (500 mg total) by mouth 2 (two) times daily.  Marland Kitchen PROVIGIL 100 MG tablet Take  1 tablet (100 mg total) by mouth 2 (two) times daily.  . sertraline (ZOLOFT) 50 MG tablet Take by mouth.   Facility-Administered Encounter Medications as of 07/04/2016  Medication  . medroxyPROGESTERone (DEPO-PROVERA) injection 150 mg  :  Review of Systems:  Out of a complete 14 point review of systems, all are reviewed and negative with the exception of these symptoms as listed below: Review of Systems  Neurological:       Patient states that she feels her medications are not working well for her weakness and fatigue.  Patient had an episode of weakness as I was bringing her back to the room.     Objective:  Neurologic Exam  Physical Exam Physical Examination:   Vitals:   07/04/16 1032  BP: 112/78  Pulse: 88  Resp: 16   General Examination: The patient is a very pleasant 21 y.o. female in no acute distress. She appears well-developed and well-nourished and adequately groomed. Briefly tearful as I advised her regarding the discrepancy between what she claims she is taking and her Rx fill dates.  Of note, per triage nurse, she had a brief episode of weakness while walking to the exam room, one knee gave out, no fall to the ground.   HEENT: Normocephalic, atraumatic, pupils are equal, round and reactive to light and accommodation, somewhat sensitive to light. Extraocular tracking is good without limitation to gaze excursion or nystagmus noted. Normal smooth pursuit is noted. Hearing is grossly intact. Face is symmetric with normal facial animation and normal facial sensation. Speech is clear with no dysarthria noted. There is no hypophonia. There is no lip, neck/head, jaw or voice tremor. Neck is supple  with full range of passive and active motion. There are no carotid bruits on auscultation. Oropharynx exam reveals: mild mouth dryness, adequate dental hygiene and mild airway crowding, Mallampati is class II, tongue protrudes centrally and palate elevates symmetrically.    Chest: Clear to auscultation without wheezing, rhonchi or crackles noted.  Heart: S1+S2+0, regular and normal without murmurs, rubs or gallops noted.   Abdomen: Soft, non-tender and non-distended with normal bowel sounds appreciated on auscultation.  Extremities: There is no pitting edema in the distal lower extremities bilaterally.   Skin: Warm and dry without trophic changes noted. There are no varicose veins.  Musculoskeletal: exam reveals no obvious joint deformities, tenderness or joint swelling or erythema.   Neurologically:  Mental status: The patient is awake, alert and oriented in all 4 spheres. Her immediate and remote memory, attention, language skills and fund of knowledge are appropriate. There is no evidence of aphasia, agnosia, apraxia or anomia. Speech is clear with normal prosody and enunciation. Thought process is linear. Mood is normal and affect is normal.  Cranial nerves II - XII are as described above under HEENT exam. In addition: shoulder shrug is normal with equal shoulder height noted. Motor exam: Normal bulk, strength and tone is noted. There is no drift, tremor or rebound, no weakness. Romberg is negative. Reflexes are 2+ throughout. Fine motor skills and coordination: grossly intact.  Cerebellar testing: No dysmetria or intention tremor. There is no truncal or gait ataxia.  Sensory exam: intact to light touch.  Gait, station and balance: She stands easily. Gait and station as well as tandem walk are unremarkable. No weakness on my exam.   Assessment and Plan:   In summary, Barbara [name] is a 21 year old female with an underlying medical history of obesity, depression, and low back  pain,  who presents for follow-up consultation of her narcolepsy with cataplexy. She presented with a history of significant excessive daytime somnolence and episodic left-sided weakness for which she has had workup under Dr. Jannifer Franklin in 2016. She has a several year history of excessive daytime somnolence and history of episodic left-sided weakness which are brief and tend to happen particularly when she is anxious or excited. Minor abnormalities were noted on her EEG. She had a normal brain MRI. Her sleep study and MSLT results from 2016 showed restless sleep, no significant sleep disordered breathing, normal REM latency, mildly reduced REM percentage at night but significant daytime somnolence with a mean sleep latency of only 2.5 minutes and 2 REM onset naps. Her history and polysomnographic test results support the Dx of narcolepsy with cataplexy. She continues to have a normal neurological and general physical exam and is reassured in that regard. She has been on imipramine 20 mg each night with good results reported in the past and, she has also been on generic Provigil 100 mg twice daily.per insurance requirement, we had to change to brand-name Provigil 100 mg twice daily. The patient has on her own increased the Provigil and ran out of the imipramine and has been off of it for almost 2 months. I strongly advise patient that she should not self adjust her medications and call us if she has any interim problems with her medications. She is advised to be truthful about how she has been taking her medications so we can help her feel better. She is in agreement. I suggested that we increase her Provigil to 200 mg twice daily and we restart her imipramine at 20 mg each night and then increase to 30 mg after a week or 2. I advised her to return in 3 months for follow-up with one of our nurse practitioners.  I answered all her questions today and she demonstrated understanding and agreement.  I spent 25 minutes in  total face-to-face time with the patient, more than 50% of which was spent in counseling and coordination of care, reviewing test results, reviewing medication and discussing or reviewing the diagnosis of narcolepsy with cataplexy, its prognosis and treatment options. Pertinent laboratory and imaging test results that were available during this visit with the patient were reviewed by me and considered in my medical decision making (see chart for details).

## 2016-09-19 ENCOUNTER — Encounter: Payer: Self-pay | Admitting: *Deleted

## 2016-09-19 ENCOUNTER — Ambulatory Visit (INDEPENDENT_AMBULATORY_CARE_PROVIDER_SITE_OTHER): Payer: Medicaid Other | Admitting: Obstetrics & Gynecology

## 2016-09-19 ENCOUNTER — Encounter: Payer: Self-pay | Admitting: Obstetrics & Gynecology

## 2016-09-19 ENCOUNTER — Other Ambulatory Visit (HOSPITAL_COMMUNITY)
Admission: RE | Admit: 2016-09-19 | Discharge: 2016-09-19 | Disposition: A | Discharge status: Home / Self Care | Payer: Medicaid Other | Source: Ambulatory Visit | Attending: Obstetrics & Gynecology | Admitting: Obstetrics & Gynecology

## 2016-09-19 VITALS — BP 115/75 | HR 80 | Wt 220.0 lb

## 2016-09-19 DIAGNOSIS — Z113 Encounter for screening for infections with a predominantly sexual mode of transmission: Secondary | ICD-10-CM

## 2016-09-19 DIAGNOSIS — O0990 Supervision of high risk pregnancy, unspecified, unspecified trimester: Secondary | ICD-10-CM | POA: Insufficient documentation

## 2016-09-19 DIAGNOSIS — Z3480 Encounter for supervision of other normal pregnancy, unspecified trimester: Secondary | ICD-10-CM

## 2016-09-19 DIAGNOSIS — Z3481 Encounter for supervision of other normal pregnancy, first trimester: Secondary | ICD-10-CM | POA: Diagnosis not present

## 2016-09-19 DIAGNOSIS — Z3A00 Weeks of gestation of pregnancy not specified: Secondary | ICD-10-CM | POA: Insufficient documentation

## 2016-09-19 DIAGNOSIS — G47419 Narcolepsy without cataplexy: Secondary | ICD-10-CM | POA: Insufficient documentation

## 2016-09-19 NOTE — Patient Instructions (Signed)
First Trimester of Pregnancy The first trimester of pregnancy is from week 1 until the end of week 13 (months 1 through 3). A week after a sperm fertilizes an egg, the egg will implant on the wall of the uterus. This embryo will begin to develop into a baby. Genes from you and your partner will form the baby. The female genes will determine whether the baby will be a boy or a girl. At 6-8 weeks, the eyes and face will be formed, and the heartbeat can be seen on ultrasound. At the end of 12 weeks, all the baby's organs will be formed. Now that you are pregnant, you will want to do everything you can to have a healthy baby. Two of the most important things are to get good prenatal care and to follow your health care provider's instructions. Prenatal care is all the medical care you receive before the baby's birth. This care will help prevent, find, and treat any problems during the pregnancy and childbirth. Body changes during your first trimester Your body goes through many changes during pregnancy. The changes vary from woman to woman.  You may gain or lose a couple of pounds at first.  You may feel sick to your stomach (nauseous) and you may throw up (vomit). If the vomiting is uncontrollable, call your health care provider.  You may tire easily.  You may develop headaches that can be relieved by medicines. All medicines should be approved by your health care provider.  You may urinate more often. Painful urination may mean you have a bladder infection.  You may develop heartburn as a result of your pregnancy.  You may develop constipation because certain hormones are causing the muscles that push stool through your intestines to slow down.  You may develop hemorrhoids or swollen veins (varicose veins).  Your breasts may begin to grow larger and become tender. Your nipples may stick out more, and the tissue that surrounds them (areola) may become darker.  Your gums may bleed and may be  sensitive to brushing and flossing.  Dark spots or blotches (chloasma, mask of pregnancy) may develop on your face. This will likely fade after the baby is born.  Your menstrual periods will stop.  You may have a loss of appetite.  You may develop cravings for certain kinds of food.  You may have changes in your emotions from day to day, such as being excited to be pregnant or being concerned that something may go wrong with the pregnancy and baby.  You may have more vivid and strange dreams.  You may have changes in your hair. These can include thickening of your hair, rapid growth, and changes in texture. Some women also have hair loss during or after pregnancy, or hair that feels dry or thin. Your hair will most likely return to normal after your baby is born.  What to expect at prenatal visits During a routine prenatal visit:  You will be weighed to make sure you and the baby are growing normally.  Your blood pressure will be taken.  Your abdomen will be measured to track your baby's growth.  The fetal heartbeat will be listened to between weeks 10 and 14 of your pregnancy.  Test results from any previous visits will be discussed.  Your health care provider may ask you:  How you are feeling.  If you are feeling the baby move.  If you have had any abnormal symptoms, such as leaking fluid, bleeding, severe headaches,   or abdominal cramping.  If you are using any tobacco products, including cigarettes, chewing tobacco, and electronic cigarettes.  If you have any questions.  Other tests that may be performed during your first trimester include:  Blood tests to find your blood type and to check for the presence of any previous infections. The tests will also be used to check for low iron levels (anemia) and protein on red blood cells (Rh antibodies). Depending on your risk factors, or if you previously had diabetes during pregnancy, you may have tests to check for high blood  sugar that affects pregnant women (gestational diabetes).  Urine tests to check for infections, diabetes, or protein in the urine.  An ultrasound to confirm the proper growth and development of the baby.  Fetal screens for spinal cord problems (spina bifida) and Down syndrome.  HIV (human immunodeficiency virus) testing. Routine prenatal testing includes screening for HIV, unless you choose not to have this test.  You may need other tests to make sure you and the baby are doing well.  Follow these instructions at home: Medicines  Follow your health care provider's instructions regarding medicine use. Specific medicines may be either safe or unsafe to take during pregnancy.  Take a prenatal vitamin that contains at least 600 micrograms (mcg) of folic acid.  If you develop constipation, try taking a stool softener if your health care provider approves. Eating and drinking  Eat a balanced diet that includes fresh fruits and vegetables, whole grains, good sources of protein such as meat, eggs, or tofu, and low-fat dairy. Your health care provider will help you determine the amount of weight gain that is right for you.  Avoid raw meat and uncooked cheese. These carry germs that can cause birth defects in the baby.  Eating four or five small meals rather than three large meals a day may help relieve nausea and vomiting. If you start to feel nauseous, eating a few soda crackers can be helpful. Drinking liquids between meals, instead of during meals, also seems to help ease nausea and vomiting.  Limit foods that are high in fat and processed sugars, such as fried and sweet foods.  To prevent constipation: ? Eat foods that are high in fiber, such as fresh fruits and vegetables, whole grains, and beans. ? Drink enough fluid to keep your urine clear or pale yellow. Activity  Exercise only as directed by your health care provider. Most women can continue their usual exercise routine during  pregnancy. Try to exercise for 30 minutes at least 5 days a week. Exercising will help you: ? Control your weight. ? Stay in shape. ? Be prepared for labor and delivery.  Experiencing pain or cramping in the lower abdomen or lower back is a good sign that you should stop exercising. Check with your health care provider before continuing with normal exercises.  Try to avoid standing for long periods of time. Move your legs often if you must stand in one place for a long time.  Avoid heavy lifting.  Wear low-heeled shoes and practice good posture.  You may continue to have sex unless your health care provider tells you not to. Relieving pain and discomfort  Wear a good support bra to relieve breast tenderness.  Take warm sitz baths to soothe any pain or discomfort caused by hemorrhoids. Use hemorrhoid cream if your health care provider approves.  Rest with your legs elevated if you have leg cramps or low back pain.  If you develop   varicose veins in your legs, wear support hose. Elevate your feet for 15 minutes, 3-4 times a day. Limit salt in your diet. Prenatal care  Schedule your prenatal visits by the twelfth week of pregnancy. They are usually scheduled monthly at first, then more often in the last 2 months before delivery.  Write down your questions. Take them to your prenatal visits.  Keep all your prenatal visits as told by your health care provider. This is important. Safety  Wear your seat belt at all times when driving.  Make a list of emergency phone numbers, including numbers for family, friends, the hospital, and police and fire departments. General instructions  Ask your health care provider for a referral to a local prenatal education class. Begin classes no later than the beginning of month 6 of your pregnancy.  Ask for help if you have counseling or nutritional needs during pregnancy. Your health care provider can offer advice or refer you to specialists for help  with various needs.  Do not use hot tubs, steam rooms, or saunas.  Do not douche or use tampons or scented sanitary pads.  Do not cross your legs for long periods of time.  Avoid cat litter boxes and soil used by cats. These carry germs that can cause birth defects in the baby and possibly loss of the fetus by miscarriage or stillbirth.  Avoid all smoking, herbs, alcohol, and medicines not prescribed by your health care provider. Chemicals in these products affect the formation and growth of the baby.  Do not use any products that contain nicotine or tobacco, such as cigarettes and e-cigarettes. If you need help quitting, ask your health care provider. You may receive counseling support and other resources to help you quit.  Schedule a dentist appointment. At home, brush your teeth with a soft toothbrush and be gentle when you floss. Contact a health care provider if:  You have dizziness.  You have mild pelvic cramps, pelvic pressure, or nagging pain in the abdominal area.  You have persistent nausea, vomiting, or diarrhea.  You have a bad smelling vaginal discharge.  You have pain when you urinate.  You notice increased swelling in your face, hands, legs, or ankles.  You are exposed to fifth disease or chickenpox.  You are exposed to German measles (rubella) and have never had it. Get help right away if:  You have a fever.  You are leaking fluid from your vagina.  You have spotting or bleeding from your vagina.  You have severe abdominal cramping or pain.  You have rapid weight gain or loss.  You vomit blood or material that looks like coffee grounds.  You develop a severe headache.  You have shortness of breath.  You have any kind of trauma, such as from a fall or a car accident. Summary  The first trimester of pregnancy is from week 1 until the end of week 13 (months 1 through 3).  Your body goes through many changes during pregnancy. The changes vary from  woman to woman.  You will have routine prenatal visits. During those visits, your health care provider will examine you, discuss any test results you may have, and talk with you about how you are feeling. This information is not intended to replace advice given to you by your health care provider. Make sure you discuss any questions you have with your health care provider. Document Released: 01/22/2001 Document Revised: 01/10/2016 Document Reviewed: 01/10/2016 Elsevier Interactive Patient Education  2017 Elsevier   Inc.  

## 2016-09-19 NOTE — Progress Notes (Signed)
Last pap 05/12/15 - [facility] - Care Everywhere - Updated HIM Narcolepsy - neuro appt 09/26/16 Pt desires genetic screening CF Negative - 08/2012  DATING AND VIABILITY SONOGRAM   Haskell Flirtabitha T Feher is a 21 y.o. year old G2P1001 with LMP Patient's last menstrual period was 07/31/2016. which would correlate to  5850w1d weeks gestation.  She has regular menstrual cycles.   She is here today for a confirmatory initial sonogram.    GESTATION: SINGLETON  FETAL ACTIVITY:          Heart rate         130bpm          The fetus is active.   GESTATIONAL AGE AND  BIOMETRICS:  Gestational criteria: Estimated Date of Delivery: 05/14/17 by early ultrasound now at 7311w1d  Previous Scans:0  CROWN RUMP LENGTH   0.492cm 4111w1d         AVERAGE EGA(BY THIS SCAN):  6.1weeks  WORKING EDD( early ultrasound ):  05/14/17     TECHNICIAN COMMENTS:  SLIUP measuring 4911w1d by CRL with FHR 130bpm   A copy of this report including all images has been saved and backed up to a second source for retrieval if needed. All measures and details of the anatomical scan, placentation, fluid volume and pelvic anatomy are contained in that report.  Jamyria Ozanich 09/19/2016 10:44 AM

## 2016-09-19 NOTE — Progress Notes (Signed)
Subjective:   Barbara [name] is a 21 y.o. G2P1001 at [redacted]w[redacted]d by early ultrasound being seen today for her first obstetrical visit.  Her obstetrical history is significant for term SVD. Patient does intend to breast feed. Pregnancy history fully reviewed.  Patient reports no complaints.  HISTORY: Obstetric History   G2   P1   T1   P0   A0   L1    SAB0   TAB0   Ectopic0   Multiple0   Live Births1     # Outcome Date GA Lbr Len/2nd Weight Sex Delivery Anes PTL Lv  2 Current           1 Term 02/24/13 [redacted]w[redacted]d 22:29 / 00:19 8 lb 6.4 oz (3.81 kg) M Vag-Spont EPI  LIV     Name: Farve,BOY Cleopatra     Apgar1:  9                Apgar5: 9     Past Medical History:  Diagnosis Date  . Back pain   . Hemiparesis (HCC) 08/31/2014  . Infection    UTI  . Narcolepsy with cataplexy   . Urinary tract infection    currently being treated w/Cephalexin   Past Surgical History:  Procedure Laterality Date  . NO PAST SURGERIES     Family History  Problem Relation Age of Onset  . Depression Mother   . Healthy Brother   . Healthy Sister   . Hypertension Maternal Grandmother   . Depression Maternal Grandmother   . Asthma Maternal Grandmother   . Diabetes Maternal Grandmother   . Heart disease Maternal Grandmother   . Hearing loss Maternal Grandfather    Social History  Substance Use Topics  . Smoking status: Never Smoker  . Smokeless tobacco: Never Used  . Alcohol use No   Allergies  Allergen Reactions  . Corn-Containing Products Anaphylaxis and Swelling  . Other Itching and Nausea And Vomiting    SHERBET.  Marland Kitchen Percocet [Oxycodone-Acetaminophen] Other (See Comments)    States feels like chest is tightening up.   No current outpatient prescriptions on file prior to visit.   Current Facility-Administered Medications on File Prior to Visit  Medication Dose Route Frequency Provider Last Rate Last Dose  . medroxyPROGESTERone (DEPO-PROVERA) injection 150 mg  150 mg Intramuscular Q90 days  Reva Bores, MD   150 mg at 04/06/14 1145     Exam   Vitals:   09/19/16 1024  BP: 115/75  Pulse: 80  Weight: 220 lb (99.8 kg)      Uterus:   8 week size  Pelvic Exam: Perineum: no hemorrhoids, normal perineum   Vulva: normal external genitalia, no lesions   Vagina:  normal mucosa, normal discharge   Cervix: no lesions and normal, pap smear done.    Adnexa: normal adnexa and no mass, fullness, tenderness   Bony Pelvis: average  System: General: well-developed, well-nourished female in no acute distress   Breast:  normal appearance, no masses or tenderness   Skin: normal coloration and turgor, no rashes   Neurologic: oriented, normal, negative, normal mood   Extremities: normal strength, tone, and muscle mass, ROM of all joints is normal   HEENT PERRLA, extraocular movement intact and sclera clear, anicteric   Mouth/Teeth mucous membranes moist, pharynx normal without lesions and dental hygiene good   Neck supple and no masses   Cardiovascular: regular rate and rhythm   Respiratory:  no respiratory distress, normal breath  sounds   Abdomen: soft, non-tender; bowel sounds normal; no masses,  no organomegaly     Assessment:   Pregnancy: G2P1001 Patient Active Problem List   Diagnosis Date Noted  . Supervision of other normal pregnancy, antepartum 09/19/2016  . Hemiparesis (HCC) 08/31/2014  . Depressed mood with postpartum onset 01/18/2014     Plan:  1. Supervision of other normal pregnancy, antepartum Initial labs drawn.  Signed up for Babyscripts. Continue prenatal vitamins. - US bedside; Future - Culture, OB Urine - GC/Chlam[facility] amp (Leslie[facility] - Obstetric Panel, Including HIV - US MFM Fetal Nuchal Translucency; Future - SMN1 Copy Number Analysis - Hemoglobin A1c - CMP and Liver - Hemoglobinopathy evaluation Genetic Screening discussed, First trimester screen: ordered. Ultrasound discussed; fetal anatomic survey: to be ordered. Problem  list reviewed and updated. The nature of [facility] with multiple MDs and other Advanced Practice Providers was explained to patient; also emphasized that residents, students are part of our team. Routine obstetric precautions reviewed. Return in about 6 weeks (around 10/31/2016) for OB 12 week visit (Babyscripts).     Jaynie CollinsUGONNA  Emmalou Hunger, MD, FACOG Attending Obstetrician & Gynecologist, [facility], [facility]

## 2016-09-23 LAB — URINE CULTURE, OB REFLEX

## 2016-09-23 LAB — GC/CHLAMYDIA PROBE AMP (~~LOC~~) NOT AT ARMC
CHLAMYDIA, DNA PROBE: NEGATIVE
Neisseria Gonorrhea: NEGATIVE

## 2016-09-23 LAB — CULTURE, OB URINE

## 2016-09-24 ENCOUNTER — Other Ambulatory Visit: Payer: Self-pay | Admitting: Obstetrics and Gynecology

## 2016-09-24 MED ORDER — CEPHALEXIN 500 MG PO CAPS: 500.0000 mg | ORAL_CAPSULE | Freq: Four times a day (QID) | ORAL | 0 refills | Status: DC

## 2016-09-27 LAB — SMN1 COPY NUMBER ANALYSIS (SMA CARRIER SCREENING)

## 2016-09-27 LAB — OBSTETRIC PANEL, INCLUDING HIV
Antibody Screen: NEGATIVE
Basophils Absolute: 0 10*3/uL (ref 0.0–0.2)
Basos: 0 %
EOS (ABSOLUTE): 0 10*3/uL (ref 0.0–0.4)
EOS: 0 %
HEMOGLOBIN: 14.1 g/dL (ref 11.1–15.9)
HEP B S AG: NEGATIVE
HIV Screen 4th Generation wRfx: NONREACTIVE
Hematocrit: 41.7 % (ref 34.0–46.6)
IMMATURE GRANS (ABS): 0.1 10*3/uL (ref 0.0–0.1)
IMMATURE GRANULOCYTES: 1 %
LYMPHS: 20 %
Lymphocytes Absolute: 2.2 10*3/uL (ref 0.7–3.1)
MCH: 28.7 pg (ref 26.6–33.0)
MCHC: 33.8 g/dL (ref 31.5–35.7)
MCV: 85 fL (ref 79–97)
MONOCYTES: 4 %
Monocytes Absolute: 0.4 10*3/uL (ref 0.1–0.9)
NEUTROS PCT: 75 %
Neutrophils Absolute: 8.2 10*3/uL — ABNORMAL HIGH (ref 1.4–7.0)
Platelets: 338 10*3/uL (ref 150–379)
RBC: 4.92 x10E6/uL (ref 3.77–5.28)
RDW: 13.8 % (ref 12.3–15.4)
RH TYPE: POSITIVE
RPR: NONREACTIVE
Rubella Antibodies, IGG: 3.21 index (ref >0.99)
WBC: 10.9 10*3/uL — ABNORMAL HIGH (ref 3.4–10.8)

## 2016-09-27 LAB — CMP AND LIVER
ALT: 49 IU/L — AB (ref 0–32)
AST: 30 IU/L (ref 0–40)
Albumin: 4.5 g/dL (ref 3.5–5.5)
Alkaline Phosphatase: 52 IU/L (ref 39–117)
BILIRUBIN, DIRECT: 0.14 mg/dL (ref 0.00–0.40)
BUN: 9 mg/dL (ref 6–20)
Bilirubin Total: 0.5 mg/dL (ref 0.0–1.2)
CALCIUM: 9.5 mg/dL (ref 8.7–10.2)
CO2: 19 mmol/L — AB (ref 20–29)
Chloride: 101 mmol/L (ref 96–106)
Creatinine, Ser: 0.76 mg/dL (ref 0.57–1.00)
GFR calc non Af Amer: 112 mL/min/{1.73_m2} (ref >59)
GFR, EST AFRICAN AMERICAN: 129 mL/min/{1.73_m2} (ref >59)
GLUCOSE: 77 mg/dL (ref 65–99)
Potassium: 4.3 mmol/L (ref 3.5–5.2)
Sodium: 136 mmol/L (ref 134–144)
Total Protein: 7.4 g/dL (ref 6.0–8.5)

## 2016-09-27 LAB — HEMOGLOBINOPATHY EVALUATION
HGB A: 97.9 % (ref 96.4–98.8)
HGB C: 0 %
HGB S: 0 %
HGB VARIANT: 0 %
Hemoglobin A2 Quantitation: 2.1 % (ref 1.8–3.2)
Hemoglobin F Quantitation: 0 % (ref 0.0–2.0)

## 2016-09-27 LAB — HEMOGLOBIN A1C
Est. average glucose Bld gHb Est-mCnc: 97 mg/dL
HEMOGLOBIN A1C: 5 % (ref 4.8–5.6)

## 2016-09-30 ENCOUNTER — Other Ambulatory Visit: Payer: Self-pay

## 2016-09-30 MED ORDER — DOXYLAMINE-PYRIDOXINE ER 20-20 MG PO TBCR: 1.0000 | EXTENDED_RELEASE_TABLET | ORAL | 3 refills | Status: DC

## 2016-09-30 MED ORDER — DOXYLAMINE-PYRIDOXINE 10-10 MG PO TBEC
DELAYED_RELEASE_TABLET | ORAL | 3 refills | Status: DC
Start: 2016-09-30 — End: 2017-05-02

## 2016-10-16 ENCOUNTER — Ambulatory Visit: Payer: Self-pay | Admitting: Adult Health

## 2016-10-21 ENCOUNTER — Inpatient Hospital Stay (HOSPITAL_COMMUNITY)
Admission: AD | Admit: 2016-10-21 | Discharge: 2016-10-21 | Disposition: A | Discharge status: Home / Self Care | Payer: Medicaid Other | Source: Ambulatory Visit | Attending: Obstetrics & Gynecology | Admitting: Obstetrics & Gynecology

## 2016-10-21 ENCOUNTER — Encounter (HOSPITAL_COMMUNITY): Payer: Self-pay | Admitting: *Deleted

## 2016-10-21 DIAGNOSIS — Z885 Allergy status to narcotic agent status: Secondary | ICD-10-CM | POA: Diagnosis not present

## 2016-10-21 DIAGNOSIS — Z3A10 10 weeks gestation of pregnancy: Secondary | ICD-10-CM | POA: Insufficient documentation

## 2016-10-21 DIAGNOSIS — Z6790 Unspecified blood type, Rh positive: Secondary | ICD-10-CM | POA: Insufficient documentation

## 2016-10-21 DIAGNOSIS — O209 Hemorrhage in early pregnancy, unspecified: Secondary | ICD-10-CM | POA: Diagnosis not present

## 2016-10-21 DIAGNOSIS — R109 Unspecified abdominal pain: Secondary | ICD-10-CM | POA: Diagnosis present

## 2016-10-21 LAB — URINALYSIS, ROUTINE W REFLEX MICROSCOPIC
Bacteria, UA: NONE SEEN
Bilirubin Urine: NEGATIVE
Glucose, UA: NEGATIVE mg/dL
Hgb urine dipstick: NEGATIVE
Ketones, ur: NEGATIVE mg/dL
Nitrite: NEGATIVE
PH: 5 (ref 5.0–8.0)
Protein, ur: NEGATIVE mg/dL
SPECIFIC GRAVITY, URINE: 1.015 (ref 1.005–1.030)

## 2016-10-21 NOTE — MAU Note (Signed)
Pt C/O lower abd cramping all morning, went to BR around 0945 & noted blood in toilet.  Unsure if still bleeding now.

## 2016-10-21 NOTE — Discharge Instructions (Signed)
Vaginal Bleeding During Pregnancy, First Trimester °A small amount of bleeding (spotting) from the vagina is common in early pregnancy. Sometimes the bleeding is normal and is not a problem, and sometimes it is a sign of something serious. Be sure to tell your doctor about any bleeding from your vagina right away. °Follow these instructions at home: °· Watch your condition for any changes. °· Follow your doctor's instructions about how active you can be. °· If you are on bed rest: °? You may need to stay in bed and only get up to use the bathroom. °? You may be allowed to do some activities. °? If you need help, make plans for someone to help you. °· Write down: °? The number of pads you use each day. °? How often you change pads. °? How soaked (saturated) your pads are. °· Do not use tampons. °· Do not douche. °· Do not have sex or orgasms until your doctor says it is okay. °· If you pass any tissue from your vagina, save the tissue so you can show it to your doctor. °· Only take medicines as told by your doctor. °· Do not take aspirin because it can make you bleed. °· Keep all follow-up visits as told by your doctor. °Contact a doctor if: °· You bleed from your vagina. °· You have cramps. °· You have labor pains. °· You have a fever that does not go away after you take medicine. °Get help right away if: °· You have very bad cramps in your back or belly (abdomen). °· You pass large clots or tissue from your vagina. °· You bleed more. °· You feel light-headed or weak. °· You pass out (faint). °· You have chills. °· You are leaking fluid or have a gush of fluid from your vagina. °· You pass out while pooping (having a bowel movement). °This information is not intended to replace advice given to you by your health care provider. Make sure you discuss any questions you have with your health care provider. °Document Released: 06/14/2013 Document Revised: 07/06/2015 Document Reviewed: 10/05/2012 °Elsevier Interactive  Patient Education © 2018 Elsevier Inc. ° °

## 2016-10-21 NOTE — MAU Provider Note (Signed)
History     CSN: [id number]  Arrival date and time: 10/21/16 1025   First Provider Initiated Contact with Patient 10/21/16 1110      Chief Complaint  Patient presents with  . Abdominal Pain  . Vaginal Bleeding   G2P1001 @10 .5 wks here with VB and cramping. VB started around 0900. She saw red blood on the toilet paper and in the toilet water. Lower abdominal cramping started after that. No urinary sx. No constipation. No vomiting. No recent IC.    OB History    Gravida Para Term Preterm AB Living   2 1 1     1    SAB TAB Ectopic Multiple Live Births           1      Past Medical History:  Diagnosis Date  . Back pain   . Hemiparesis (HCC) 08/31/2014  . Infection    UTI  . Narcolepsy with cataplexy   . Urinary tract infection    currently being treated w/Cephalexin    Past Surgical History:  Procedure Laterality Date  . NO PAST SURGERIES      Family History  Problem Relation Age of Onset  . Depression Mother   . Healthy Brother   . Healthy Sister   . Hypertension Maternal Grandmother   . Depression Maternal Grandmother   . Asthma Maternal Grandmother   . Diabetes Maternal Grandmother   . Heart disease Maternal Grandmother   . Hearing loss Maternal Grandfather     Social History  Substance Use Topics  . Smoking status: Never Smoker  . Smokeless tobacco: Never Used  . Alcohol use No    Allergies:  Allergies  Allergen Reactions  . Corn-Containing Products Anaphylaxis and Swelling  . Other Itching and Nausea And Vomiting    SHERBET.  Marland Kitchen. Percocet [Oxycodone-Acetaminophen] Other (See Comments)    States feels like chest is tightening up.    No prescriptions prior to admission.    Review of Systems  Gastrointestinal: Positive for abdominal pain.  Genitourinary: Positive for vaginal bleeding. Negative for dysuria and vaginal discharge.   Physical Exam   Blood pressure 121/73, pulse 90, temperature 98.2 F (36.8 C), temperature source Oral, resp.  rate 16, height 5\' 5"  (1.651 m), weight 216 lb (98 kg), last menstrual period 07/31/2016.  Physical Exam  Constitutional: She is oriented to person, place, and time. She appears well-developed and well-nourished. No distress.  HENT:  Head: Normocephalic and atraumatic.  Neck: Normal range of motion.  Cardiovascular: Normal rate.   Respiratory: Effort normal. No respiratory distress.  GI: Soft. She exhibits no distension and no mass. There is no tenderness. There is no rebound and no guarding.  Genitourinary:  Genitourinary Comments: External: no lesions or erythema Vagina: rugated, pink, moist, thin white discharge, no blood or trace of blood seen Cervix closed/long   Musculoskeletal: Normal range of motion.  Neurological: She is alert and oriented to person, place, and time.  Skin: Skin is warm and dry.  Psychiatric: She has a normal mood and affect.   Limited bedside US: viable, active fetus, +cardiac activity, subj. nml AFV  Results for orders placed or performed during the hospital encounter of 10/21/16 (from the past 24 hour(s))  Urinalysis, Routine w reflex microscopic     Status: Abnormal   Collection Time: 10/21/16 10:54 AM  Result Value Ref Range   Color, Urine YELLOW YELLOW   APPearance HAZY (A) CLEAR   Specific Gravity, Urine 1.015 1.005 - 1.030  pH 5.0 5.0 - 8.0   Glucose, UA NEGATIVE NEGATIVE mg/dL   Hgb urine dipstick NEGATIVE NEGATIVE   Bilirubin Urine NEGATIVE NEGATIVE   Ketones, ur NEGATIVE NEGATIVE mg/dL   Protein, ur NEGATIVE NEGATIVE mg/dL   Nitrite NEGATIVE NEGATIVE   Leukocytes, UA LARGE (A) NEGATIVE   RBC / HPF 6-30 0 - 5 RBC/hpf   WBC, UA 6-30 0 - 5 WBC/hpf   Bacteria, UA NONE SEEN NONE SEEN   Squamous Epithelial / LPF 6-30 (A) NONE SEEN   Mucus PRESENT     MAU Course  Procedures  MDM Labs ordered and reviewed. Vaginal cultures not obtained since recetly collected and negative. Unclear source of VB, ?[facility] but not seen on [place]. Discussed bleeding  precautions. Stable for discharge home.   Assessment and Plan   1. [redacted] weeks gestation of pregnancy   2. Blood type, Rh positive   3. Vaginal bleeding in pregnancy, first trimester    Discharge home Follow up in OB office as scheduled SAB/bleeding precautions  Allergies as of 10/21/2016      Reactions   Corn-containing Products Anaphylaxis, Swelling   Other Itching, Nausea And Vomiting   SHERBET.   Percocet [oxycodone-acetaminophen] Other (See Comments)   States feels like chest is tightening up.      Medication List    STOP taking these medications   cephALEXin 500 MG capsule Commonly known as:  KEFLEX     TAKE these medications   Doxylamine-Pyridoxine 10-10 MG Tbec Take at bedtime and repeat in the morning if still nausea   PRENATE PIXIE 10-0.6-0.4-200 MG Caps Take by mouth.            Discharge Care Instructions        Start     Ordered   10/21/16 0000  Discharge patient    Question Answer Comment  Discharge disposition 01-Home or Self Care   Discharge patient date 10/21/2016      10/21/16 [id number], [place] 10/21/2016, 1:37 PM

## 2016-10-31 ENCOUNTER — Encounter: Payer: Self-pay | Admitting: *Deleted

## 2016-10-31 ENCOUNTER — Ambulatory Visit (INDEPENDENT_AMBULATORY_CARE_PROVIDER_SITE_OTHER): Payer: Medicaid Other | Admitting: Obstetrics & Gynecology

## 2016-10-31 ENCOUNTER — Encounter: Payer: Self-pay | Admitting: Obstetrics & Gynecology

## 2016-10-31 VITALS — BP 125/80 | HR 99 | Wt 215.4 lb

## 2016-10-31 DIAGNOSIS — Z3480 Encounter for supervision of other normal pregnancy, unspecified trimester: Secondary | ICD-10-CM

## 2016-10-31 DIAGNOSIS — O2341 Unspecified infection of urinary tract in pregnancy, first trimester: Secondary | ICD-10-CM

## 2016-10-31 DIAGNOSIS — Z3689 Encounter for other specified antenatal screening: Secondary | ICD-10-CM

## 2016-10-31 DIAGNOSIS — Z3481 Encounter for supervision of other normal pregnancy, first trimester: Secondary | ICD-10-CM

## 2016-10-31 MED ORDER — PRENATE PIXIE 10-0.6-0.4-200 MG PO CAPS: 1.0000 | ORAL_CAPSULE | Freq: Every day | ORAL | 15 refills | Status: DC

## 2016-10-31 NOTE — Patient Instructions (Signed)

## 2016-10-31 NOTE — Progress Notes (Signed)
   PRENATAL VISIT NOTE  Subjective:  Barbara [name] is a 21 y.o. G2P1001 at [redacted]w[redacted]d being seen today for ongoing prenatal care.  She is currently monitored for the following issues for this low-risk pregnancy and has Depressed mood with postpartum onset; Supervision of other normal pregnancy, antepartum; and Narcolepsy on her problem list.  Patient reports no complaints.  Contractions: Not present. Vag. Bleeding: None.  Movement: Absent. Denies leaking of fluid.   The following portions of the patient's history were reviewed and updated as appropriate: allergies, current medications, past family history, past medical history, past social history, past surgical history and problem list. Problem list updated.  Objective:   Vitals:   10/31/16 0956  BP: 125/80  Pulse: 99  Weight: 215 lb 6.4 oz (97.7 kg)    Fetal Status:     Movement: Absent     General:  Alert, oriented and cooperative. Patient is in no acute distress.  Skin: Skin is warm and dry. No rash noted.   Cardiovascular: Normal heart rate noted  Respiratory: Normal respiratory effort, no problems with respiration noted  Abdomen: Soft, gravid, appropriate for gestational age.  Pain/Pressure: Absent     Pelvic: Cervical exam deferred        Extremities: Normal range of motion.  Edema: None  Mental Status:  Normal mood and affect. Normal behavior. Normal judgment and thought content.   Assessment and Plan:  Pregnancy: G2P1001 at [redacted]w[redacted]d  1. Encounter for fetal anatomic survey Anatomy scan ordered - US MFM OB COMP + 14 WK; Future  2. Supervision of other normal pregnancy, antepartum - [place] MFM OB COMP + 14 WK; Future - Prenat-FeAsp-Meth-FA-DHA w/o A (PRENATE PIXIE) 10-0.6-0.4-200 MG CAPS; Take 1 capsule by mouth daily.  Dispense: 30 capsule; Refill: 15  3. UTI in pregnancy, first trimester TOC next visit - Culture, OB Urine No other complaints or concerns.  Routine obstetric precautions reviewed. Please refer to After Visit  Summary for other counseling recommendations.  Return in about 8 weeks (around 12/26/2016) for OB 20 week visit ([facility]).   Jaynie Collins, MD

## 2016-11-07 ENCOUNTER — Ambulatory Visit (HOSPITAL_COMMUNITY)
Admission: RE | Admit: 2016-11-07 | Discharge: 2016-11-07 | Disposition: A | Discharge status: Home / Self Care | Payer: Medicaid Other | Source: Ambulatory Visit | Attending: Obstetrics & Gynecology | Admitting: Obstetrics & Gynecology

## 2016-11-07 ENCOUNTER — Ambulatory Visit (HOSPITAL_COMMUNITY): Payer: Medicaid Other

## 2016-11-07 ENCOUNTER — Other Ambulatory Visit (HOSPITAL_COMMUNITY): Payer: Self-pay

## 2016-11-07 ENCOUNTER — Encounter (HOSPITAL_COMMUNITY): Payer: Self-pay

## 2016-11-07 ENCOUNTER — Ambulatory Visit (HOSPITAL_COMMUNITY)
Admission: RE | Admit: 2016-11-07 | Discharge: 2016-11-07 | Disposition: A | Discharge status: Home / Self Care | Payer: Medicaid Other | Source: Ambulatory Visit | Attending: Physician Assistant | Admitting: Physician Assistant

## 2016-11-07 DIAGNOSIS — O99211 Obesity complicating pregnancy, first trimester: Secondary | ICD-10-CM | POA: Diagnosis not present

## 2016-11-07 DIAGNOSIS — Z3682 Encounter for antenatal screening for nuchal translucency: Secondary | ICD-10-CM | POA: Diagnosis not present

## 2016-11-07 DIAGNOSIS — Z3480 Encounter for supervision of other normal pregnancy, unspecified trimester: Secondary | ICD-10-CM

## 2016-11-07 DIAGNOSIS — Z3A13 13 weeks gestation of pregnancy: Secondary | ICD-10-CM | POA: Diagnosis not present

## 2016-11-14 ENCOUNTER — Other Ambulatory Visit: Payer: Self-pay

## 2016-12-07 ENCOUNTER — Encounter (HOSPITAL_COMMUNITY): Payer: Self-pay | Admitting: *Deleted

## 2016-12-07 ENCOUNTER — Inpatient Hospital Stay (HOSPITAL_COMMUNITY)
Admission: AD | Admit: 2016-12-07 | Discharge: 2016-12-07 | Disposition: A | Discharge status: Home / Self Care | Payer: Medicaid Other | Source: Ambulatory Visit | Attending: Obstetrics and Gynecology | Admitting: Obstetrics and Gynecology

## 2016-12-07 DIAGNOSIS — G47411 Narcolepsy with cataplexy: Secondary | ICD-10-CM | POA: Insufficient documentation

## 2016-12-07 DIAGNOSIS — O2242 Hemorrhoids in pregnancy, second trimester: Secondary | ICD-10-CM | POA: Diagnosis not present

## 2016-12-07 DIAGNOSIS — Z3A00 Weeks of gestation of pregnancy not specified: Secondary | ICD-10-CM | POA: Diagnosis not present

## 2016-12-07 DIAGNOSIS — N939 Abnormal uterine and vaginal bleeding, unspecified: Secondary | ICD-10-CM | POA: Diagnosis present

## 2016-12-07 DIAGNOSIS — Z3480 Encounter for supervision of other normal pregnancy, unspecified trimester: Secondary | ICD-10-CM

## 2016-12-07 MED ORDER — DOCUSATE SODIUM 100 MG PO CAPS: 100.0000 mg | ORAL_CAPSULE | Freq: Two times a day (BID) | ORAL | 0 refills | Status: DC

## 2016-12-07 MED ORDER — HYDROCORTISONE ACE-PRAMOXINE 1-1 % RE FOAM: 1.0000 | Freq: Two times a day (BID) | RECTAL | 5 refills | Status: DC

## 2016-12-07 NOTE — MAU Provider Note (Signed)
History   [place] and had a [place] and when she wiped she saw blood on tissue and in toilet. Also c/o low back discomfort.  CSN: [id number]  Arrival date & time 12/07/16  2009   None     Chief Complaint  Patient presents with  . Vaginal Bleeding    HPI  Past Medical History:  Diagnosis Date  . Back pain   . Hemiparesis (HCC) 08/31/2014   Occasional, evaluated by Neurology  . 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 UTI (urinary tract infection)     Past Surgical History:  Procedure Laterality Date  . NO PAST SURGERIES      Family History  Problem Relation Age of Onset  . Depression Mother   . Healthy Brother   . Healthy Sister   . Hypertension Maternal Grandmother   . Depression Maternal Grandmother   . Asthma Maternal Grandmother   . Diabetes Maternal Grandmother   . Heart disease Maternal Grandmother   . Hearing loss Maternal Grandfather     Social History  Substance Use Topics  . Smoking status: Never Smoker  . Smokeless tobacco: Never Used  . Alcohol use No    OB History    Gravida Para Term Preterm AB Living   2 1 1     1    SAB TAB Ectopic Multiple Live Births           1      Review of Systems  Constitutional: Negative.   HENT: Negative.   Eyes: Negative.   Respiratory: Negative.   Cardiovascular: Negative.   Gastrointestinal: Positive for anal bleeding and blood in stool.  Endocrine: Negative.   Genitourinary: Negative.   Musculoskeletal: Positive for back pain.  Skin: Negative.   Allergic/Immunologic: Negative.   Neurological: Negative.   Hematological: Negative.   Psychiatric/Behavioral: Negative.     Allergies  Corn-containing products; Other; and Percocet [oxycodone-acetaminophen]  Home Medications   Current Outpatient Rx  . Order #: [id number] Class: Normal  . Order #: [id number] Class: Normal    BP 124/67   Pulse (!) 101   Temp 98.8 F (37.1 C) (Oral)   Resp 18    Ht 5\' 5"  (1.651 m)   Wt 221 lb (100.2 kg)   LMP 07/31/2016   BMI 36.78 kg/m   Physical Exam  Constitutional: She is oriented to person, place, and time. She appears well-developed and well-nourished.  HENT:  Head: Normocephalic.  Eyes: Pupils are equal, round, and reactive to light.  Neck: Normal range of motion.  Cardiovascular: Normal rate, regular rhythm, normal heart sounds and intact distal pulses.   Pulmonary/Chest: Effort normal and breath sounds normal.  Abdominal: Soft. Bowel sounds are normal.  Genitourinary: Vagina normal and uterus normal.  Musculoskeletal: Normal range of motion.  Neurological: She is alert and oriented to person, place, and time. She has normal reflexes.  Skin: Skin is warm and dry.  Psychiatric: She has a normal mood and affect. Her behavior is normal. Thought content normal.    MAU Course  Procedures (including critical care time)  Labs Reviewed  URINALYSIS, ROUTINE W REFLEX MICROSCOPIC   No results found.   1. Hemorrhoids during pregnancy in second trimester   2. Supervision of other normal pregnancy, antepartum       MDM  Discussed high fiber diet, VSS, SVE firm/cl/post/high. No vaginal bleeding noted with exam. sm non thrombosed hemorrhoids noted. Will d/c home on  stool softner and proctofoam

## 2016-12-07 NOTE — Discharge Instructions (Signed)
Hemorrhoids    Hemorrhoids are swollen veins in and around the rectum or anus. Hemorrhoids can cause pain, itching, or bleeding. Most of the time, they do not cause serious problems. They usually get better with diet changes, lifestyle changes, and other home treatments.  Follow these instructions at home:  Eating and drinking  · Eat foods that have fiber, such as whole grains, beans, nuts, fruits, and vegetables. Ask your doctor about taking products that have added fiber (fiber supplements).  · Drink enough fluid to keep your pee (urine) clear or pale yellow.  For Pain and Swelling  · Take a warm-water bath (sitz bath) for 20 minutes to ease pain. Do this 3-4 times a day.  · If directed, put ice on the painful area. It may be helpful to use ice between your warm baths.  ¨ Put ice in a plastic bag.  ¨ Place a towel between your skin and the bag.  ¨ Leave the ice on for 20 minutes, 2-3 times a day.  General instructions  · Take over-the-counter and prescription medicines only as told by your doctor.  ¨ Medicated creams and medicines that are inserted into the anus (suppositories) may be used or applied as told.  · Exercise often.  · Go to the bathroom when you have the urge to poop (to have a bowel movement). Do not wait.  · Avoid pushing too hard (straining) when you poop.  · Keep the butt area dry and clean. Use wet toilet paper or moist paper towels.  · Do not sit on the toilet for a long time.  Contact a doctor if:  · You have any of these:  ¨ Pain and swelling that do not get better with treatment or medicine.  ¨ Bleeding that will not stop.  ¨ Trouble pooping or you cannot poop.  ¨ Pain or swelling outside the area of the hemorrhoids.  This information is not intended to replace advice given to you by your health care provider. Make sure you discuss any questions you have with your health care provider.  Document Released: 11/07/2007 Document Revised: 07/06/2015 Document Reviewed: 10/12/2014  Elsevier  Interactive Patient Education © 2018 [employer]   

## 2016-12-07 NOTE — MAU Note (Signed)
Pt reports she was at a fall [place] all day and was having some cramping. Got home and went to [place] and had dark red bleeding when she wi[ped. Unsure if it was from her vagina or rectum. Still c/o some cramping on and off

## 2016-12-17 ENCOUNTER — Other Ambulatory Visit: Payer: Self-pay | Admitting: Obstetrics & Gynecology

## 2016-12-17 ENCOUNTER — Ambulatory Visit (HOSPITAL_COMMUNITY)
Admission: RE | Admit: 2016-12-17 | Discharge: 2016-12-17 | Disposition: A | Discharge status: Home / Self Care | Payer: Medicaid Other | Source: Ambulatory Visit | Attending: Obstetrics & Gynecology | Admitting: Obstetrics & Gynecology

## 2016-12-17 DIAGNOSIS — Z3689 Encounter for other specified antenatal screening: Secondary | ICD-10-CM

## 2016-12-17 DIAGNOSIS — Z3482 Encounter for supervision of other normal pregnancy, second trimester: Secondary | ICD-10-CM | POA: Diagnosis not present

## 2016-12-17 DIAGNOSIS — Z3A18 18 weeks gestation of pregnancy: Secondary | ICD-10-CM | POA: Diagnosis not present

## 2016-12-17 DIAGNOSIS — Z3480 Encounter for supervision of other normal pregnancy, unspecified trimester: Secondary | ICD-10-CM

## 2016-12-17 DIAGNOSIS — O99212 Obesity complicating pregnancy, second trimester: Secondary | ICD-10-CM | POA: Diagnosis not present

## 2016-12-18 ENCOUNTER — Other Ambulatory Visit: Payer: Self-pay | Admitting: Obstetrics & Gynecology

## 2016-12-18 DIAGNOSIS — Z0489 Encounter for examination and observation for other specified reasons: Secondary | ICD-10-CM

## 2016-12-18 DIAGNOSIS — IMO0002 Reserved for concepts with insufficient information to code with codable children: Secondary | ICD-10-CM

## 2016-12-18 NOTE — Progress Notes (Signed)
MFM called and informed of order for follow up scan. They will call and schedule this study for patient.  Barbara CollinsUGONNA  Barbara Girgenti, MD, FACOG Attending Obstetrician & Gynecologist, [facility], [facility]

## 2016-12-26 ENCOUNTER — Encounter: Payer: Self-pay | Admitting: Family Medicine

## 2017-01-07 ENCOUNTER — Encounter: Payer: Self-pay | Admitting: Obstetrics & Gynecology

## 2017-01-07 ENCOUNTER — Ambulatory Visit (INDEPENDENT_AMBULATORY_CARE_PROVIDER_SITE_OTHER): Payer: Medicaid Other | Admitting: Obstetrics & Gynecology

## 2017-01-07 VITALS — BP 109/73 | HR 89 | Wt 222.0 lb

## 2017-01-07 DIAGNOSIS — O2340 Unspecified infection of urinary tract in pregnancy, unspecified trimester: Secondary | ICD-10-CM | POA: Insufficient documentation

## 2017-01-07 DIAGNOSIS — Z3482 Encounter for supervision of other normal pregnancy, second trimester: Secondary | ICD-10-CM

## 2017-01-07 DIAGNOSIS — F53 Postpartum depression: Secondary | ICD-10-CM

## 2017-01-07 DIAGNOSIS — O99345 Other mental disorders complicating the puerperium: Principal | ICD-10-CM

## 2017-01-07 DIAGNOSIS — Z3480 Encounter for supervision of other normal pregnancy, unspecified trimester: Secondary | ICD-10-CM

## 2017-01-07 DIAGNOSIS — O2342 Unspecified infection of urinary tract in pregnancy, second trimester: Secondary | ICD-10-CM

## 2017-01-07 LAB — POCT URINALYSIS DIPSTICK
BILIRUBIN UA: NEGATIVE
Blood, UA: NEGATIVE
Glucose, UA: NEGATIVE
Nitrite, UA: NEGATIVE
PH UA: 6 (ref 5.0–8.0)
SPEC GRAV UA: 1.015 (ref 1.010–1.025)
Urobilinogen, UA: 0.2 E.U./dL

## 2017-01-07 MED ORDER — CEPHALEXIN 500 MG PO CAPS: 500.0000 mg | ORAL_CAPSULE | Freq: Two times a day (BID) | ORAL | 0 refills | Status: DC

## 2017-01-07 NOTE — Progress Notes (Signed)
   PRENATAL VISIT NOTE  Subjective:  Barbara [name] is a 21 y.o. G2P1001 at 1585w6d being seen today for ongoing prenatal care.  She is currently monitored for the following issues for this low-risk pregnancy and has Depressed mood with postpartum onset; Supervision of other normal pregnancy, antepartum; and Narcolepsy on their problem list.  Patient reports dysuria.  Contractions: Not present. Vag. Bleeding: None.  Movement: Present. Denies leaking of fluid.   The following portions of the patient's history were reviewed and updated as appropriate: allergies, current medications, past family history, past medical history, past social history, past surgical history and problem list. Problem list updated.  Objective:   Vitals:   01/07/17 0926  BP: 109/73  Pulse: 89  Weight: 222 lb (100.7 kg)    Fetal Status: Fetal Heart Rate (bpm): 141   Movement: Present     General:  Alert, oriented and cooperative. Patient is in no acute distress.  Skin: Skin is warm and dry. No rash noted.   Cardiovascular: Normal heart rate noted  Respiratory: Normal respiratory effort, no problems with respiration noted  Abdomen: Soft, gravid, appropriate for gestational age.  Pain/Pressure: Present     Pelvic: Cervical exam deferred        Extremities: Normal range of motion.  Edema: None  Mental Status:  Normal mood and affect. Normal behavior. Normal judgment and thought content.   Assessment and Plan:  Pregnancy: G2P1001 at 6885w6d  1. Supervision of other normal pregnancy, antepartum  - POCT Urinalysis Dipstick - Culture, OB Urine  AFP today Need f/u US to complete anatomy on 01/22/2017  2. Depressed mood with postpartum onset  3. UTI Keflex 500mg  bid x 7 day F/u urine cx  Flu vaccine next visit . Pt will also see if she can get one at another location,  Preterm labor symptoms and general obstetric precautions including but not limited to vaginal bleeding, contractions, leaking of fluid and  fetal movement were reviewed in detail with the patient. Please refer to After Visit Summary for other counseling recommendations.  Return in about 4 weeks (around 02/04/2017).   Willodean Rosenthalarolyn Harraway-Smith, MD

## 2017-01-07 NOTE — Progress Notes (Signed)
Urine Dip shows small ketones, trace protein, large leukocytes - will send for culture

## 2017-01-09 LAB — CULTURE, OB URINE

## 2017-01-09 LAB — AFP, SERUM, OPEN SPINA BIFIDA
AFP MOM: 1.31
AFP Value: 69.1 ng/mL
Gest. Age on Collection Date: 21.6 weeks
MATERNAL AGE AT EDD: 23.5 a
OSBR Risk 1 IN: 4664
TEST RESULTS AFP: NEGATIVE
Weight: 222 [lb_av]

## 2017-01-09 LAB — URINE CULTURE, OB REFLEX

## 2017-01-22 ENCOUNTER — Ambulatory Visit (HOSPITAL_COMMUNITY)
Admission: RE | Admit: 2017-01-22 | Discharge: 2017-01-22 | Disposition: A | Discharge status: Home / Self Care | Payer: Medicaid Other | Source: Ambulatory Visit | Attending: Obstetrics & Gynecology | Admitting: Obstetrics & Gynecology

## 2017-01-22 DIAGNOSIS — Z362 Encounter for other antenatal screening follow-up: Secondary | ICD-10-CM | POA: Diagnosis not present

## 2017-01-22 DIAGNOSIS — O99212 Obesity complicating pregnancy, second trimester: Secondary | ICD-10-CM | POA: Diagnosis not present

## 2017-01-22 DIAGNOSIS — Z3A24 24 weeks gestation of pregnancy: Secondary | ICD-10-CM | POA: Diagnosis not present

## 2017-01-22 DIAGNOSIS — IMO0002 Reserved for concepts with insufficient information to code with codable children: Secondary | ICD-10-CM

## 2017-01-22 DIAGNOSIS — Z0489 Encounter for examination and observation for other specified reasons: Secondary | ICD-10-CM

## 2017-02-05 ENCOUNTER — Encounter: Payer: Self-pay | Admitting: Obstetrics and Gynecology

## 2017-02-05 ENCOUNTER — Ambulatory Visit (INDEPENDENT_AMBULATORY_CARE_PROVIDER_SITE_OTHER): Payer: Medicaid Other | Admitting: Obstetrics and Gynecology

## 2017-02-05 VITALS — BP 121/75 | HR 110 | Wt 227.0 lb

## 2017-02-05 DIAGNOSIS — Z23 Encounter for immunization: Secondary | ICD-10-CM | POA: Diagnosis not present

## 2017-02-05 DIAGNOSIS — Z3480 Encounter for supervision of other normal pregnancy, unspecified trimester: Secondary | ICD-10-CM

## 2017-02-05 DIAGNOSIS — Z3482 Encounter for supervision of other normal pregnancy, second trimester: Secondary | ICD-10-CM

## 2017-02-05 DIAGNOSIS — O2340 Unspecified infection of urinary tract in pregnancy, unspecified trimester: Secondary | ICD-10-CM

## 2017-02-05 DIAGNOSIS — O2342 Unspecified infection of urinary tract in pregnancy, second trimester: Secondary | ICD-10-CM

## 2017-02-05 DIAGNOSIS — Z8279 Family history of other congenital malformations, deformations and chromosomal abnormalities: Secondary | ICD-10-CM | POA: Insufficient documentation

## 2017-02-05 LAB — POCT URINE QUALITATIVE DIPSTICK BLOOD: RBC UA: NEGATIVE

## 2017-02-05 NOTE — Progress Notes (Signed)
flu

## 2017-02-05 NOTE — Progress Notes (Signed)
Prenatal Visit Note Date: 02/05/2017 Clinic: [facility]  Subjective:  Barbara [name] is a 21 y.o. G2P1001 at 4874w0d being seen today for ongoing prenatal care.  She is currently monitored for the following issues for this low-risk pregnancy and has Depressed mood with postpartum onset; Supervision of other normal pregnancy, antepartum; Narcolepsy; UTI (urinary tract infection) in pregnancy, antepartum; and Family history of cleft palate on their problem list.  Patient reports odd smelling urine. Contractions: Irritability. Vag. Bleeding: None.  Movement: Present. Denies leaking of fluid.   The following portions of the patient's history were reviewed and updated as appropriate: allergies, current medications, past family history, past medical history, past social history, past surgical history and problem list. Problem list updated.  Objective:   Vitals:   02/05/17 0930  BP: 121/75  Pulse: (!) 110  Weight: 227 lb (103 kg)    Fetal Status: Fetal Heart Rate (bpm): 135 Fundal Height: 26 cm Movement: Present     General:  Alert, oriented and cooperative. Patient is in no acute distress.  Skin: Skin is warm and dry. No rash noted.   Cardiovascular: Normal heart rate noted  Respiratory: Normal respiratory effort, no problems with respiration noted  Abdomen: Soft, gravid, appropriate for gestational age. Pain/Pressure: Present     Pelvic:  Cervical exam deferred        Extremities: Normal range of motion.  Edema: None  Mental Status: Normal mood and affect. Normal behavior. Normal judgment and thought content.   Urinalysis:      Assessment and Plan:  Pregnancy: G2P1001 at 3874w0d  1. Encounter for immunization - Flu Vaccine QUAD 36+ mos IM (Fluarix, Quad PF)  2. Supervision of other normal pregnancy, antepartum 28wk labs nv.  - US MFM OB FOLLOW UP; Future  3. UTI (urinary tract infection) in pregnancy, antepartum u dip neg today and toc neg last visit. ER  precautions given - POCT urine qual dipstick blood  4. Family history of cleft palate completion anatomy u/s in next few weeks  Preterm labor symptoms and general obstetric precautions including but not limited to vaginal bleeding, contractions, leaking of fluid and fetal movement were reviewed in detail with the patient. Please refer to After Visit Summary for other counseling recommendations.  Return in about 2 weeks (around 02/19/2017) for rob and 28wk labs.   [place] BingPickens, [place], MD

## 2017-02-11 NOTE — L&D Delivery Note (Signed)
Barbara [name] is a 22 y.o. female G2P1001 with IUP at 3975w2d admitted for IOL for gHTN.  She progressed with cytotec, foley, pitocin and AROM augmentation to complete and pushed 3 times to deliver.  Delivery Note At 1:50 PM a viable female was delivered via  (Presentation: LOA, with compound hand presentation).  APGAR: 8, 9; weight pending.   Placenta status: spontaneous, intact.  Cord: 3 vessels  Anesthesia:  epidural Episiotomy:  none Lacerations:  none Suture Repair: none Est. Blood Loss (mL):  50ml  Mom to postpartum.  Baby to Couplet care / Skin to Skin.  Rolm BookbinderCaroline M Paulita Licklider CNM 05/02/2017, 2:00 PM  Please schedule this patient for Postpartum visit in: 4 weeks with the following provider: Any provider For C/S patients schedule nurse incision check in weeks 2 weeks: no Low risk pregnancy complicated by: HTN Delivery mode:  SVD Anticipated Birth Control:  other/unsure PP Procedures needed: BP check  Schedule Integrated BH visit: no

## 2017-02-12 ENCOUNTER — Encounter: Payer: Self-pay | Admitting: Neurology

## 2017-02-21 ENCOUNTER — Encounter: Payer: Self-pay | Admitting: Obstetrics and Gynecology

## 2017-02-21 NOTE — Progress Notes (Signed)
Patient did not keep OB appointment for 02/21/2017.  Barbara [name], Jr MD Attending [facility] Midwife(Faculty Practice)

## 2017-02-24 ENCOUNTER — Ambulatory Visit (HOSPITAL_COMMUNITY): Payer: Medicaid Other

## 2017-03-11 ENCOUNTER — Ambulatory Visit (HOSPITAL_COMMUNITY)
Admission: RE | Admit: 2017-03-11 | Discharge: 2017-03-11 | Disposition: A | Discharge status: Home / Self Care | Payer: Medicaid Other | Source: Ambulatory Visit | Attending: Obstetrics and Gynecology | Admitting: Obstetrics and Gynecology

## 2017-03-11 ENCOUNTER — Other Ambulatory Visit: Payer: Self-pay | Admitting: Obstetrics and Gynecology

## 2017-03-11 DIAGNOSIS — Z3A30 30 weeks gestation of pregnancy: Secondary | ICD-10-CM | POA: Insufficient documentation

## 2017-03-11 DIAGNOSIS — O99213 Obesity complicating pregnancy, third trimester: Secondary | ICD-10-CM | POA: Diagnosis not present

## 2017-03-11 DIAGNOSIS — Z362 Encounter for other antenatal screening follow-up: Secondary | ICD-10-CM

## 2017-03-11 DIAGNOSIS — Z3480 Encounter for supervision of other normal pregnancy, unspecified trimester: Secondary | ICD-10-CM

## 2017-03-11 DIAGNOSIS — E669 Obesity, unspecified: Secondary | ICD-10-CM | POA: Insufficient documentation

## 2017-03-12 ENCOUNTER — Encounter: Payer: Self-pay | Admitting: Obstetrics and Gynecology

## 2017-03-12 DIAGNOSIS — O3660X0 Maternal care for excessive fetal growth, unspecified trimester, not applicable or unspecified: Secondary | ICD-10-CM | POA: Insufficient documentation

## 2017-03-13 ENCOUNTER — Ambulatory Visit (INDEPENDENT_AMBULATORY_CARE_PROVIDER_SITE_OTHER): Payer: Medicaid Other | Admitting: Obstetrics and Gynecology

## 2017-03-13 ENCOUNTER — Encounter: Payer: Self-pay | Admitting: Obstetrics and Gynecology

## 2017-03-13 VITALS — BP 129/79 | HR 100 | Wt 229.0 lb

## 2017-03-13 DIAGNOSIS — O3663X0 Maternal care for excessive fetal growth, third trimester, not applicable or unspecified: Secondary | ICD-10-CM

## 2017-03-13 DIAGNOSIS — O3660X0 Maternal care for excessive fetal growth, unspecified trimester, not applicable or unspecified: Secondary | ICD-10-CM

## 2017-03-13 DIAGNOSIS — Z3483 Encounter for supervision of other normal pregnancy, third trimester: Secondary | ICD-10-CM

## 2017-03-13 DIAGNOSIS — Z3480 Encounter for supervision of other normal pregnancy, unspecified trimester: Secondary | ICD-10-CM

## 2017-03-13 NOTE — Progress Notes (Signed)
Prenatal Visit Note Date: 03/13/2017 Clinic: [facility]  Subjective:  Barbara [name] is a 22 y.o. G2P1001 at 5460w1d being seen today for ongoing prenatal care.  She is currently monitored for the following issues for this low-risk pregnancy and has Depressed mood with postpartum onset; Supervision of other normal pregnancy, antepartum; Narcolepsy; UTI (urinary tract infection) in pregnancy, antepartum; Family history of cleft palate; and LGA (large for gestational age) fetus affecting management of mother on their problem list.  Patient reports no complaints.   Contractions: Irritability. Vag. Bleeding: None.  Movement: Present. Denies leaking of fluid.   The following portions of the patient's history were reviewed and updated as appropriate: allergies, current medications, past family history, past medical history, past social history, past surgical history and problem list. Problem list updated.  Objective:   Vitals:   03/13/17 0946  BP: 129/79  Pulse: 100  Weight: 229 lb (103.9 kg)    Fetal Status: Fetal Heart Rate (bpm): 134   Movement: Present     General:  Alert, oriented and cooperative. Patient is in no acute distress.  Skin: Skin is warm and dry. No rash noted.   Cardiovascular: Normal heart rate noted  Respiratory: Normal respiratory effort, no problems with respiration noted  Abdomen: Soft, gravid, appropriate for gestational age. Pain/Pressure: Present     Pelvic:  Cervical exam deferred        Extremities: Normal range of motion.  Edema: None  Mental Status: Normal mood and affect. Normal behavior. Normal judgment and thought content.   Urinalysis:      Assessment and Plan:  Pregnancy: G2P1001 at 6460w1d  1. Supervision of other normal pregnancy, antepartum Routine care - RPR - Glucose Tolerance, 2 Hours w/1 Hour - CBC - US MFM OB FOLLOW UP; Future  2. Excessive fetal growth affecting management of pregnancy, antepartum, single or  unspecified fetus F/u 2hr GTT - US MFM OB FOLLOW UP; Future  Preterm labor symptoms and general obstetric precautions including but not limited to vaginal bleeding, contractions, leaking of fluid and fetal movement were reviewed in detail with the patient. Please refer to After Visit Summary for other counseling recommendations.  Return in about 2 weeks (around 03/27/2017) for rob.   [place] BingPickens, [place], MD

## 2017-03-14 ENCOUNTER — Encounter: Payer: Self-pay | Admitting: Obstetrics and Gynecology

## 2017-03-14 ENCOUNTER — Telehealth: Payer: Self-pay | Admitting: *Deleted

## 2017-03-14 DIAGNOSIS — Z8632 Personal history of gestational diabetes: Secondary | ICD-10-CM | POA: Insufficient documentation

## 2017-03-14 DIAGNOSIS — O24419 Gestational diabetes mellitus in pregnancy, unspecified control: Secondary | ICD-10-CM

## 2017-03-14 LAB — GLUCOSE TOLERANCE, 2 HOURS W/ 1HR
GLUCOSE, 1 HOUR: 90 mg/dL (ref 65–179)
Glucose, 2 hour: 90 mg/dL (ref 65–152)
Glucose, Fasting: 94 mg/dL — ABNORMAL HIGH (ref 65–91)

## 2017-03-14 LAB — CBC
HEMOGLOBIN: 11.9 g/dL (ref 11.1–15.9)
Hematocrit: 35.6 % (ref 34.0–46.6)
MCH: 28.3 pg (ref 26.6–33.0)
MCHC: 33.4 g/dL (ref 31.5–35.7)
MCV: 85 fL (ref 79–97)
Platelets: 203 10*3/uL (ref 150–379)
RBC: 4.21 x10E6/uL (ref 3.77–5.28)
RDW: 14.5 % (ref 12.3–15.4)
WBC: 10.4 10*3/uL (ref 3.4–10.8)

## 2017-03-14 LAB — RPR: RPR Ser Ql: NONREACTIVE

## 2017-03-14 MED ORDER — GLUCOSE BLOOD VI STRP: ORAL_STRIP | 12 refills | Status: DC

## 2017-03-14 MED ORDER — ACCU-CHEK FASTCLIX LANCETS MISC: 1.0000 [IU] | Freq: Four times a day (QID) | 12 refills | Status: DC

## 2017-03-14 MED ORDER — ACCU-CHEK GUIDE W/DEVICE KIT: 1.0000 | PACK | Freq: Four times a day (QID) | 0 refills | Status: DC

## 2017-03-14 NOTE — Telephone Encounter (Signed)
-----   Message from [place] Bingharlie Pickens, MD sent at 03/14/2017  9:50 AM EST ----- Can you let her know that she has gdm and set her up per protocol? thanks

## 2017-03-14 NOTE — Telephone Encounter (Signed)
Sent over referral to N&D and eScribed meter, lancets, strips. LM for pt to rtn call.

## 2017-03-19 ENCOUNTER — Encounter (HOSPITAL_COMMUNITY): Payer: Self-pay

## 2017-03-19 ENCOUNTER — Inpatient Hospital Stay (HOSPITAL_COMMUNITY)
Admission: AD | Admit: 2017-03-19 | Discharge: 2017-03-19 | Disposition: A | Discharge status: Home / Self Care | Payer: Medicaid Other | Source: Ambulatory Visit | Attending: Obstetrics and Gynecology | Admitting: Obstetrics and Gynecology

## 2017-03-19 ENCOUNTER — Other Ambulatory Visit: Payer: Self-pay

## 2017-03-19 DIAGNOSIS — Z822 Family history of deafness and hearing loss: Secondary | ICD-10-CM | POA: Diagnosis not present

## 2017-03-19 DIAGNOSIS — O99891 Other specified diseases and conditions complicating pregnancy: Secondary | ICD-10-CM

## 2017-03-19 DIAGNOSIS — Z8249 Family history of ischemic heart disease and other diseases of the circulatory system: Secondary | ICD-10-CM | POA: Diagnosis not present

## 2017-03-19 DIAGNOSIS — Z87440 Personal history of urinary (tract) infections: Secondary | ICD-10-CM | POA: Insufficient documentation

## 2017-03-19 DIAGNOSIS — O26893 Other specified pregnancy related conditions, third trimester: Secondary | ICD-10-CM | POA: Insufficient documentation

## 2017-03-19 DIAGNOSIS — Z3A32 32 weeks gestation of pregnancy: Secondary | ICD-10-CM

## 2017-03-19 DIAGNOSIS — G47411 Narcolepsy with cataplexy: Secondary | ICD-10-CM | POA: Diagnosis not present

## 2017-03-19 DIAGNOSIS — Z825 Family history of asthma and other chronic lower respiratory diseases: Secondary | ICD-10-CM | POA: Diagnosis not present

## 2017-03-19 DIAGNOSIS — M545 Low back pain: Secondary | ICD-10-CM | POA: Insufficient documentation

## 2017-03-19 DIAGNOSIS — Z833 Family history of diabetes mellitus: Secondary | ICD-10-CM | POA: Insufficient documentation

## 2017-03-19 DIAGNOSIS — M549 Dorsalgia, unspecified: Secondary | ICD-10-CM

## 2017-03-19 DIAGNOSIS — O9989 Other specified diseases and conditions complicating pregnancy, childbirth and the puerperium: Secondary | ICD-10-CM | POA: Diagnosis not present

## 2017-03-19 DIAGNOSIS — Z818 Family history of other mental and behavioral disorders: Secondary | ICD-10-CM | POA: Diagnosis not present

## 2017-03-19 DIAGNOSIS — Z885 Allergy status to narcotic agent status: Secondary | ICD-10-CM | POA: Insufficient documentation

## 2017-03-19 DIAGNOSIS — O99353 Diseases of the nervous system complicating pregnancy, third trimester: Secondary | ICD-10-CM | POA: Diagnosis not present

## 2017-03-19 DIAGNOSIS — Z91018 Allergy to other foods: Secondary | ICD-10-CM | POA: Diagnosis not present

## 2017-03-19 DIAGNOSIS — G8190 Hemiplegia, unspecified affecting unspecified side: Secondary | ICD-10-CM | POA: Diagnosis not present

## 2017-03-19 LAB — URINALYSIS, ROUTINE W REFLEX MICROSCOPIC
Bilirubin Urine: NEGATIVE
Glucose, UA: NEGATIVE mg/dL
Hgb urine dipstick: NEGATIVE
KETONES UR: NEGATIVE mg/dL
Nitrite: NEGATIVE
PH: 6 (ref 5.0–8.0)
Protein, ur: NEGATIVE mg/dL
SPECIFIC GRAVITY, URINE: 1.027 (ref 1.005–1.030)

## 2017-03-19 LAB — RAPID URINE DRUG SCREEN, HOSP PERFORMED
Amphetamines: NOT DETECTED
Barbiturates: NOT DETECTED
Benzodiazepines: NOT DETECTED
COCAINE: NOT DETECTED
Opiates: NOT DETECTED
Tetrahydrocannabinol: NOT DETECTED

## 2017-03-19 MED ORDER — CYCLOBENZAPRINE HCL 10 MG PO TABS: 10.0000 mg | ORAL_TABLET | Freq: Two times a day (BID) | ORAL | 0 refills | Status: DC | PRN

## 2017-03-19 MED ORDER — CYCLOBENZAPRINE HCL 10 MG PO TABS
10.0000 mg | ORAL_TABLET | Freq: Every day | ORAL | Status: DC
  Administered 2017-03-19: 10 mg via ORAL
  Filled 2017-03-19: qty 1

## 2017-03-19 MED ORDER — ACETAMINOPHEN 500 MG PO TABS
1000.0000 mg | ORAL_TABLET | Freq: Once | ORAL | Status: AC
  Administered 2017-03-19: 1000 mg via ORAL
  Filled 2017-03-19: qty 2

## 2017-03-19 NOTE — MAU Note (Signed)
Right around 5, started having really bad lower back pains.  Doesn't know if it is contractions.  Will come on strong, then eases some and returns. During the night, got up, noted ? Fluid went down her leg, none since.  Diarrhea started a wk ago, watery, several times a day, has not gone today.

## 2017-03-19 NOTE — MAU Note (Signed)
Pt in tears, asst to rm via wc, pt having a difficult time moving, walking.

## 2017-03-19 NOTE — MAU Note (Signed)
Pt complaining of lower back pain.  Pt "stood up and felt fluid from vagina drip down leg".

## 2017-03-19 NOTE — Discharge Instructions (Signed)
Back Pain, Adult Back pain is very common. The pain often gets better over time. The cause of back pain is usually not dangerous. Most people can learn to manage their back pain on their own. Follow these instructions at home: Watch your back pain for any changes. The following actions may help to lessen any pain you are feeling:  Stay active. Start with short walks on flat ground if you can. Try to walk farther each day.  Exercise regularly as told by your doctor. Exercise helps your back heal faster. It also helps avoid future injury by keeping your muscles strong and flexible.  Do not sit, drive, or stand in one place for more than 30 minutes.  Do not stay in bed. Resting more than 1-2 days can slow down your recovery.  Be careful when you bend or lift an object. Use good form when lifting: ? Bend at your knees. ? Keep the object close to your body. ? Do not twist.  Sleep on a firm mattress. Lie on your side, and bend your knees. If you lie on your back, put a pillow under your knees.  Take medicines only as told by your doctor.  Put ice on the injured area. ? Put ice in a plastic bag. ? Place a towel between your skin and the bag. ? Leave the ice on for 20 minutes, 2-3 times a day for the first 2-3 days. After that, you can switch between ice and heat packs.  Avoid feeling anxious or stressed. Find good ways to deal with stress, such as exercise.  Maintain a healthy weight. Extra weight puts stress on your back.  Contact a doctor if:  You have pain that does not go away with rest or medicine.  You have worsening pain that goes down into your legs or buttocks.  You have pain that does not get better in one week.  You have pain at night.  You lose weight.  You have a fever or chills. Get help right away if:  You cannot control when you poop (bowel movement) or pee (urinate).  Your arms or legs feel weak.  Your arms or legs lose feeling (numbness).  You feel sick  to your stomach (nauseous) or throw up (vomit).  You have belly (abdominal) pain.  You feel like you may pass out (faint). This information is not intended to replace advice given to you by your health care provider. Make sure you discuss any questions you have with your health care provider. Document Released: 07/17/2007 Document Revised: 07/06/2015 Document Reviewed: 06/01/2013 Elsevier Interactive Patient Education  2018 Else[employer]  Back Exercises If you have pain in your back, do these exercises 2-3 times each day or as told by your doctor. When the pain goes away, do the exercises once each day, but repeat the steps more times for each exercise (do more repetitions). If you do not have pain in your back, do these exercises once each day or as told by your doctor. Exercises Single Knee to Chest  Do these steps 3-5 times in a row for each leg: 1. Lie on your back on a firm bed or the floor with your legs stretched out. 2. Bring one knee to your chest. 3. Hold your knee to your chest by grabbing your knee or thigh. 4. Pull on your knee until you feel a gentle stretch in your lower back. 5. Keep doing the stretch for 10-30 seconds. 6. Slowly let go of your leg   and straighten it.  Pelvic Tilt  Do these steps 5-10 times in a row: 1. Lie on your back on a firm bed or the floor with your legs stretched out. 2. Bend your knees so they point up to the ceiling. Your feet should be flat on the floor. 3. Tighten your lower belly (abdomen) muscles to press your lower back against the floor. This will make your tailbone point up to the ceiling instead of pointing down to your feet or the floor. 4. Stay in this position for 5-10 seconds while you gently tighten your muscles and breathe evenly.  Cat-Cow  Do these steps until your lower back bends more easily: 1. Get on your hands and knees on a firm surface. Keep your hands under your shoulders, and keep your knees under your hips. You may  put padding under your knees. 2. Let your head hang down, and make your tailbone point down to the floor so your lower back is round like the back of a cat. 3. Stay in this position for 5 seconds. 4. Slowly lift your head and make your tailbone point up to the ceiling so your back hangs low (sags) like the back of a cow. 5. Stay in this position for 5 seconds.  Press-Ups  Do these steps 5-10 times in a row: 1. Lie on your belly (face-down) on the floor. 2. Place your hands near your head, about shoulder-width apart. 3. While you keep your back relaxed and keep your hips on the floor, slowly straighten your arms to raise the top half of your body and lift your shoulders. Do not use your back muscles. To make yourself more comfortable, you may change where you place your hands. 4. Stay in this position for 5 seconds. 5. Slowly return to lying flat on the floor.  Bridges  Do these steps 10 times in a row: 1. Lie on your back on a firm surface. 2. Bend your knees so they point up to the ceiling. Your feet should be flat on the floor. 3. Tighten your butt muscles and lift your butt off of the floor until your waist is almost as high as your knees. If you do not feel the muscles working in your butt and the back of your thighs, slide your feet 1-2 inches farther away from your butt. 4. Stay in this position for 3-5 seconds. 5. Slowly lower your butt to the floor, and let your butt muscles relax.  If this exercise is too easy, try doing it with your arms crossed over your chest. Belly Crunches  Do these steps 5-10 times in a row: 1. Lie on your back on a firm bed or the floor with your legs stretched out. 2. Bend your knees so they point up to the ceiling. Your feet should be flat on the floor. 3. Cross your arms over your chest. 4. Tip your chin a little bit toward your chest but do not bend your neck. 5. Tighten your belly muscles and slowly raise your chest just enough to lift your  shoulder blades a tiny bit off of the floor. 6. Slowly lower your chest and your head to the floor.  Back Lifts Do these steps 5-10 times in a row: 1. Lie on your belly (face-down) with your arms at your sides, and rest your forehead on the floor. 2. Tighten the muscles in your legs and your butt. 3. Slowly lift your chest off of the floor while you keep your   hips on the floor. Keep the back of your head in line with the curve in your back. Look at the floor while you do this. 4. Stay in this position for 3-5 seconds. 5. Slowly lower your chest and your face to the floor.  Contact a doctor if:  Your back pain gets a lot worse when you do an exercise.  Your back pain does not lessen 2 hours after you exercise. If you have any of these problems, stop doing the exercises. Do not do them again unless your doctor says it is okay. Get help right away if:  You have sudden, very bad back pain. If this happens, stop doing the exercises. Do not do them again unless your doctor says it is okay. This information is not intended to replace advice given to you by your health care provider. Make sure you discuss any questions you have with your health care provider. Document Released: 03/02/2010 Document Revised: 07/06/2015 Document Reviewed: 03/24/2014 Elsevier Interactive Patient Education  2018 Elsevier Inc.  

## 2017-03-19 NOTE — MAU Provider Note (Signed)
History     CSN: [id number]  Arrival date and time: 03/19/17 1758   First Provider Initiated Contact with Patient 03/19/17 1916      Chief Complaint  Patient presents with  . Back Pain  . Rupture of Membranes   HPI   [name] is a 22 y.o. female with a history of chronic back pain, G2P1001 @ 44w0dhere in MAU with lower back pain. States the pain started today around 5:00 pm. States she feels like she needs to poop however cannot. Last BM was this morning and it was liquid. States she has only had 1 BM today. States her BM's are normally loose. The pain in her back is lower. The pain comes and goes. It starts off small and then increases to more severe pain. Intercourse was last night. No history of preterm labor. No bleeding. Saw some water from her vagina that ran down her leg after intercourse only. No gush of fluid, not having to wear a pad. No further leaking. States she has not taken anything for the pain today. No urinary complaints.   OB History    Gravida Para Term Preterm AB Living   '2 1 1     1   '$ SAB TAB Ectopic Multiple Live Births           1      Past Medical History:  Diagnosis Date  . Back pain   . [place]   Occasional, evaluated by Neurology  . 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 UTI (urinary tract infection)     Past Surgical History:  Procedure Laterality Date  . NO PAST SURGERIES      Family History  Problem Relation Age of Onset  . Depression Mother   . Healthy Brother   . Healthy Sister   . Hypertension Maternal Grandmother   . Depression Maternal Grandmother   . Asthma Maternal Grandmother   . Diabetes Maternal Grandmother   . Heart disease Maternal Grandmother   . Hearing loss Maternal Grandfather     Social History   Tobacco Use  . Smoking status: Never Smoker  . Smokeless tobacco: Never Used  Substance Use Topics  . Alcohol use: No    Alcohol/week: 0.0  oz  . Drug use: No    Allergies:  Allergies  Allergen Reactions  . Corn-Containing Products Anaphylaxis and Swelling  . Other Itching and Nausea And Vomiting    SHERBET.  .Marland KitchenPercocet [Oxycodone-Acetaminophen] Other (See Comments)    States feels like chest is tightening up.    Medications Prior to Admission  Medication Sig Dispense Refill Last Dose  . ACCU-CHEK FASTCLIX LANCETS MISC 1 Units by Percutaneous route 4 (four) times daily. 100 each 12   . acetaminophen (TYLENOL) 500 MG tablet Take 500 mg by mouth every 6 (six) hours as needed.   Taking  . Blood Glucose Monitoring Suppl (ACCU-CHEK GUIDE) w/Device KIT 1 Device by Does not apply route 4 (four) times daily. 1 kit 0   . docusate sodium (COLACE) 100 MG capsule Take 1 capsule (100 mg total) by mouth every 12 (twelve) hours. (Patient not taking: Reported on 03/13/2017) 60 capsule 0 Not Taking  . Doxylamine-Pyridoxine 10-10 MG TBEC Take at bedtime and repeat in the morning if still nausea (Patient not taking: Reported on 03/13/2017) 180 tablet 3 Not Taking  . glucose blood (ACCU-CHEK GUIDE) test strip Use to check blood sugars  four times a day was instructed 50 each 12   . hydrocortisone-pramoxine (PROCTOFOAM HC) rectal foam Place 1 applicator rectally 2 (two) times daily. (Patient not taking: Reported on 03/13/2017) 10 g 5 Not Taking  . Prenat-FeAsp-Meth-FA-DHA w/o A (PRENATE PIXIE) 10-0.6-0.4-200 MG CAPS Take 1 capsule by mouth daily. 30 capsule 15 Taking   Results for orders placed or performed during the hospital encounter of 03/19/17 (from the past 48 hour(s))  Urinalysis, Routine w reflex microscopic     Status: Abnormal   Collection Time: 03/19/17  6:20 PM  Result Value Ref Range   Color, Urine AMBER (A) YELLOW    Comment: BIOCHEMICALS MAY BE AFFECTED BY COLOR   APPearance CLOUDY (A) CLEAR   Specific Gravity, Urine 1.027 1.005 - 1.030   pH 6.0 5.0 - 8.0   Glucose, UA NEGATIVE NEGATIVE mg/dL   Hgb urine dipstick NEGATIVE  NEGATIVE   Bilirubin Urine NEGATIVE NEGATIVE   Ketones, ur NEGATIVE NEGATIVE mg/dL   Protein, ur NEGATIVE NEGATIVE mg/dL   Nitrite NEGATIVE NEGATIVE   Leukocytes, UA LARGE (A) NEGATIVE   RBC / HPF 0-5 0 - 5 RBC/hpf   WBC, UA 6-30 0 - 5 WBC/hpf   Bacteria, UA FEW (A) NONE SEEN   Squamous Epithelial / LPF TOO NUMEROUS TO COUNT (A) NONE SEEN   Mucus PRESENT    Non Squamous Epithelial 0-5 (A) NONE SEEN    Comment: Performed at [facility], [address], [place], [place] [address]  Urine rapid drug screen (hosp performed)     Status: None   Collection Time: 03/19/17  6:20 PM  Result Value Ref Range   Opiates NONE DETECTED NONE DETECTED   Cocaine NONE DETECTED NONE DETECTED   Benzodiazepines NONE DETECTED NONE DETECTED   Amphetamines NONE DETECTED NONE DETECTED   Tetrahydrocannabinol NONE DETECTED NONE DETECTED   Barbiturates NONE DETECTED NONE DETECTED    Comment: (NOTE) DRUG SCREEN FOR MEDICAL PURPOSES ONLY.  IF CONFIRMATION IS NEEDED FOR ANY PURPOSE, NOTIFY LAB WITHIN 5 DAYS. LOWEST DETECTABLE LIMITS FOR URINE DRUG SCREEN Drug Class                     Cutoff (ng/mL) Amphetamine and metabolites    1000 Barbiturate and metabolites    200 Benzodiazepine                 540 Tricyclics and metabolites     300 Opiates and metabolites        300 Cocaine and metabolites        300 THC                            50 Performed at [facility], [address], [place], [place] [address]    Review of Systems  Constitutional: Negative for fever.  Gastrointestinal: Negative for abdominal pain.  Genitourinary: Negative for flank pain.  Musculoskeletal: Positive for back pain.   Physical Exam   Blood pressure 125/77, pulse 99, temperature 98.5 F (36.9 C), temperature source Oral, resp. rate 20, weight 234 lb 12 oz (106.5 kg), last menstrual period 07/31/2016, SpO2 99 %.  Physical Exam  Constitutional: She is oriented to person, place, and time. She appears well-developed  and well-nourished. No distress.  HENT:  Head: Normocephalic.  Eyes: Pupils are equal, round, and reactive to light.  GI: Soft. She exhibits no distension. There is no tenderness. There is no rebound and no CVA tenderness.  Genitourinary:  Genitourinary Comments: Dilation: Closed Effacement (%): Thick Cervical Position: Anterior Exam by:: Noni Saupe, NP  Musculoskeletal: Normal range of motion.       Lumbar back: She exhibits tenderness. She exhibits normal range of motion, no swelling, no pain and no spasm.  Neurological: She is alert and oriented to person, place, and time.  Skin: Skin is warm. She is not diaphoretic.  Psychiatric: Her behavior is normal.   Fetal Tracing: Baseline: 125 bpm Variability: Moderate  Accelerations: 15x15 Decelerations: None Toco: Quiet   MAU Course  Procedures  None  MDM  UA Urine drug screen Urine culture.  Tylenol 1 gram given PO Flexeril 10 mg PO given Patient sleeping in the bed. Smiling, states her pain is gone.   Assessment and Plan   A:  1. Back pain affecting pregnancy in third trimester   2. Musculoskeletal back pain   3. [redacted] weeks gestation of pregnancy    P:  Discharge home with strict return precautions If leaking resumes return to MAU Rx: Flexeril  Return to MAU if symptoms worsen  Increase oral fluid intake Ok to use tylenol as directed on the bottle Follow up with OB as scheduled.   Lezlie Lye, NP 03/19/2017 9:07 PM

## 2017-03-27 ENCOUNTER — Ambulatory Visit (INDEPENDENT_AMBULATORY_CARE_PROVIDER_SITE_OTHER): Payer: Medicaid Other | Admitting: Obstetrics and Gynecology

## 2017-03-27 ENCOUNTER — Telehealth: Payer: Self-pay

## 2017-03-27 VITALS — BP 121/84 | HR 121 | Wt 238.8 lb

## 2017-03-27 DIAGNOSIS — O24419 Gestational diabetes mellitus in pregnancy, unspecified control: Secondary | ICD-10-CM | POA: Diagnosis not present

## 2017-03-27 DIAGNOSIS — O0990 Supervision of high risk pregnancy, unspecified, unspecified trimester: Secondary | ICD-10-CM

## 2017-03-27 DIAGNOSIS — O3660X0 Maternal care for excessive fetal growth, unspecified trimester, not applicable or unspecified: Secondary | ICD-10-CM

## 2017-03-27 DIAGNOSIS — Z9119 Patient's noncompliance with other medical treatment and regimen: Secondary | ICD-10-CM

## 2017-03-27 DIAGNOSIS — O24410 Gestational diabetes mellitus in pregnancy, diet controlled: Secondary | ICD-10-CM

## 2017-03-27 DIAGNOSIS — Z91199 Patient's noncompliance with other medical treatment and regimen due to unspecified reason: Secondary | ICD-10-CM

## 2017-03-27 LAB — GLUCOSE, POCT (MANUAL RESULT ENTRY): POC GLUCOSE: 122 mg/dL — AB (ref 70–99)

## 2017-03-27 NOTE — Telephone Encounter (Signed)
Call patient regarding baby scripts. No answer or voice mail to leave a message.

## 2017-03-27 NOTE — Progress Notes (Addendum)
Prenatal Visit Note Date: 03/27/2017 Clinic: [facility]  Subjective:  Barbara [name] is a 22 y.o. G2P1001 at [redacted]w[redacted]d being seen today for ongoing prenatal care.  She is currently monitored for the following issues for this high-risk pregnancy and has Depressed mood with postpartum onset; Supervision of high risk pregnancy, antepartum; Narcolepsy; UTI (urinary tract infection) in pregnancy, antepartum; Family history of cleft palate; LGA (large for gestational age) fetus affecting management of mother; and GDM (gestational diabetes mellitus) on their problem list.  Patient reports no complaints.   Contractions: Irritability. Vag. Bleeding: None.  Movement: Present. Denies leaking of fluid.   The following portions of the patient's history were reviewed and updated as appropriate: allergies, current medications, past family history, past medical history, past social history, past surgical history and problem list. Problem list updated.  Objective:   Vitals:   03/27/17 1015  BP: 121/84  Pulse: (!) 121  Weight: 238 lb 12.8 oz (108.3 kg)    Fetal Status: Fetal Heart Rate (bpm): 140   Movement: Present  Presentation: Vertex  General:  Alert, oriented and cooperative. Patient is in no acute distress.  Skin: Skin is warm and dry. No rash noted.   Cardiovascular: Normal heart rate noted  Respiratory: Normal respiratory effort, no problems with respiration noted  Abdomen: Soft, gravid, appropriate for gestational age. Pain/Pressure: Present     Pelvic:  Cervical exam deferred        Extremities: Normal range of motion.  Edema: Trace  Mental Status: Normal mood and affect. Normal behavior. Normal judgment and thought content.   Urinalysis:      Assessment and Plan:  Pregnancy: G2P1001 at [redacted]w[redacted]d  1. Excessive fetal growth affecting management of pregnancy, antepartum, single or unspecified fetus See below.  2. GDM BS 122. Pt states she ate a banana about 31m  before. Patient diagnosed with gdm on 1/31. Supplies and nutrition/diet referral sent in on 2/1. Pt states that N/D called but only appts are at 0800 and she can't with work. She also says that [facility] gave her the lancets and strips but states that pregnancy medicaid wont cover the meter and it's $30. Pt didn't call us and let us know and has been using her mother's meter and is taking it after eating breakfast and sometimes before other meals or after some.   We called [facility] and they states they processed it wrong and she can't get a covered meter now.  I d/w her re: the importance of BS control, including probably why the fetus is growing so large and I also d/w her re: increased risk of IUFD with uncontrolled BS. I d/w her re: checking AM fasting and <95 and 2hr PP at <120. I also stressed the importance of needing to see N/D for at least one visit.  F/u growth u/s already scheduled for approx 35wks.  - Hemoglobin A1c - POCT Glucose (CBG)   3. Supervision of high risk pregnancy, antepartum Routine care. 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 I told her that I can give her the standard letter but I can't write her out of work.   4. Patient noncompliance May need to treat as an a2 if pt is still not checking blood sugars by next visit.   Preterm labor symptoms and general obstetric precautions including but not limited to vaginal bleeding, contractions, leaking of  fluid and fetal movement were reviewed in detail with the patient. Please refer to After Visit Summary for other counseling recommendations.  Return in about 4 days (around 03/31/2017) for hrob.   [place] BingPickens, Carli Lefevers, MD     [phone]

## 2017-03-28 LAB — HEMOGLOBIN A1C
ESTIMATED AVERAGE GLUCOSE: 97 mg/dL
Hgb A1c MFr Bld: 5 % (ref 4.8–5.6)

## 2017-04-01 ENCOUNTER — Ambulatory Visit (INDEPENDENT_AMBULATORY_CARE_PROVIDER_SITE_OTHER): Payer: Medicaid Other | Admitting: Obstetrics and Gynecology

## 2017-04-01 VITALS — BP 124/81 | HR 97 | Wt 238.0 lb

## 2017-04-01 DIAGNOSIS — O0990 Supervision of high risk pregnancy, unspecified, unspecified trimester: Secondary | ICD-10-CM

## 2017-04-01 DIAGNOSIS — O3660X0 Maternal care for excessive fetal growth, unspecified trimester, not applicable or unspecified: Secondary | ICD-10-CM

## 2017-04-01 NOTE — Progress Notes (Signed)
Prenatal Visit Note Date: 04/01/2017 Clinic: [facility]  Subjective:  Barbara [name] is a 22 y.o. G2P1001 at 5840w6d being seen today for ongoing prenatal care.  She is currently monitored for the following issues for this high-risk pregnancy and has Depressed mood with postpartum onset; Supervision of high risk pregnancy, antepartum; Narcolepsy; UTI (urinary tract infection) in pregnancy, antepartum; Family history of cleft palate; LGA (large for gestational age) fetus affecting management of mother; and GDM (gestational diabetes mellitus) on their problem list.  Patient reports no complaints.   Contractions: Irritability. Vag. Bleeding: None.  Movement: Present. Denies leaking of fluid.   The following portions of the patient's history were reviewed and updated as appropriate: allergies, current medications, past family history, past medical history, past social history, past surgical history and problem list. Problem list updated.  Objective:   Vitals:   04/01/17 0946  BP: 124/81  Pulse: 97  Weight: 238 lb (108 kg)    Fetal Status: Fetal Heart Rate (bpm): 131   Movement: Present  Presentation: Vertex  General:  Alert, oriented and cooperative. Patient is in no acute distress.  Skin: Skin is warm and dry. No rash noted.   Cardiovascular: Normal heart rate noted  Respiratory: Normal respiratory effort, no problems with respiration noted  Abdomen: Soft, gravid, appropriate for gestational age. Pain/Pressure: Present     Pelvic:  Cervical exam deferred        Extremities: Normal range of motion.  Edema: Trace  Mental Status: Normal mood and affect. Normal behavior. Normal judgment and thought content.   Urinalysis:      Assessment and Plan:  Pregnancy: G2P1001 at 8240w6d  1. GDM Check sugars for a few days but then meter was saying battery is low and she can't afford to buy new batteries. I put our batteries in it and it still had the message but when I put  her old ones in ours, it worked fine; when I put her old ones back in her meter it worked fine, so possibly loose connection. I reviewed the #s for 2-3d and AM fastings are normal but her postprandials are 130s-180s but she states that she checks them approx 4266m after eating. I told her to check them 2 hours after eating. Log sheets given to her that have the values and when to check #s. Will have her come back on Friday for BS log book. Normal a1c last week  Preterm labor symptoms and general obstetric precautions including but not limited to vaginal bleeding, contractions, leaking of fluid and fetal movement were reviewed in detail with the patient. Please refer to After Visit Summary for other counseling recommendations.  Return in about 3 days (around 04/04/2017) for rob.   [place] BingPickens, Navaeh Kehres, MD

## 2017-04-04 ENCOUNTER — Encounter: Payer: Self-pay | Admitting: Obstetrics and Gynecology

## 2017-04-05 NOTE — Progress Notes (Signed)
Patient did not keep OB appointment for 04/04/2017.  Cornelia Barbara [name], Jr MD Attending [facility])

## 2017-04-10 ENCOUNTER — Ambulatory Visit (HOSPITAL_COMMUNITY): Payer: Medicaid Other | Attending: Obstetrics and Gynecology

## 2017-04-10 ENCOUNTER — Telehealth: Payer: Self-pay

## 2017-04-10 NOTE — Telephone Encounter (Signed)
Received notification from baby scripts concerning patient non compliance. I called to check on patient to make sure she did not need assistance from us. Left message to call back.-+

## 2017-04-16 ENCOUNTER — Inpatient Hospital Stay (HOSPITAL_COMMUNITY)
Admission: AD | Admit: 2017-04-16 | Discharge: 2017-04-16 | Disposition: A | Discharge status: Home / Self Care | Payer: Medicaid Other | Source: Ambulatory Visit | Attending: Obstetrics & Gynecology | Admitting: Obstetrics & Gynecology

## 2017-04-16 ENCOUNTER — Other Ambulatory Visit: Payer: Self-pay

## 2017-04-16 ENCOUNTER — Encounter (HOSPITAL_COMMUNITY): Payer: Self-pay

## 2017-04-16 DIAGNOSIS — Z3A00 Weeks of gestation of pregnancy not specified: Secondary | ICD-10-CM | POA: Diagnosis not present

## 2017-04-16 DIAGNOSIS — O479 False labor, unspecified: Secondary | ICD-10-CM | POA: Insufficient documentation

## 2017-04-16 HISTORY — DX: Gestational diabetes mellitus in pregnancy, unspecified control: O24.419

## 2017-04-16 HISTORY — DX: Anxiety disorder, unspecified: F41.9

## 2017-04-16 HISTORY — DX: Depression, unspecified: F32.A

## 2017-04-16 HISTORY — DX: Major depressive disorder, single episode, unspecified: F32.9

## 2017-04-16 NOTE — MAU Note (Signed)
Pt states she began having irregular UCs approx 0500 that have gotten stronger since then.  Denies LOF or vag bleeding.  States +FM.  Has not had cervical exam this pregnancy, denies intercourse last 24hrs.

## 2017-04-16 NOTE — Discharge Instructions (Signed)
Braxton Hicks Contractions °Contractions of the uterus can occur throughout pregnancy, but they are not always a sign that you are in labor. You may have practice contractions called Braxton Hicks contractions. These false labor contractions are sometimes confused with true labor. °What are Braxton Hicks contractions? °Braxton Hicks contractions are tightening movements that occur in the muscles of the uterus before labor. Unlike true labor contractions, these contractions do not result in opening (dilation) and thinning of the cervix. Toward the end of pregnancy (32-34 weeks), Braxton Hicks contractions can happen more often and may become stronger. These contractions are sometimes difficult to tell apart from true labor because they can be very uncomfortable. You should not feel embarrassed if you go to the hospital with false labor. °Sometimes, the only way to tell if you are in true labor is for your health care provider to look for changes in the cervix. The health care provider will do a physical exam and may monitor your contractions. If you are not in true labor, the exam should show that your cervix is not dilating and your water has not broken. °If there are other health problems associated with your pregnancy, it is completely safe for you to be sent home with false labor. You may continue to have Braxton Hicks contractions until you go into true labor. °How to tell the difference between true labor and false labor °True labor °· Contractions last 30-70 seconds. °· Contractions become very regular. °· Discomfort is usually felt in the top of the uterus, and it spreads to the lower abdomen and low back. °· Contractions do not go away with walking. °· Contractions usually become more intense and increase in frequency. °· The cervix dilates and gets thinner. °False labor °· Contractions are usually shorter and not as strong as true labor contractions. °· Contractions are usually irregular. °· Contractions  are often felt in the front of the lower abdomen and in the groin. °· Contractions may go away when you walk around or change positions while lying down. °· Contractions get weaker and are shorter-lasting as time goes on. °· The cervix usually does not dilate or become thin. °Follow these instructions at home: °· Take over-the-counter and prescription medicines only as told by your health care provider. °· Keep up with your usual exercises and follow other instructions from your health care provider. °· Eat and drink lightly if you think you are going into labor. °· If Braxton Hicks contractions are making you uncomfortable: °? Change your position from lying down or resting to walking, or change from walking to resting. °? Sit and rest in a tub of warm water. °? Drink enough fluid to keep your urine pale yellow. Dehydration may cause these contractions. °? Do slow and deep breathing several times an hour. °· Keep all follow-up prenatal visits as told by your health care provider. This is important. °Contact a health care provider if: °· You have a fever. °· You have continuous pain in your abdomen. °Get help right away if: °· Your contractions become stronger, more regular, and closer together. °· You have fluid leaking or gushing from your vagina. °· You pass blood-tinged mucus (bloody show). °· You have bleeding from your vagina. °· You have low back pain that you never had before. °· You feel your baby’s head pushing down and causing pelvic pressure. °· Your baby is not moving inside you as much as it used to. °Summary °· Contractions that occur before labor are called Braxton   Hicks contractions, false labor, or practice contractions. °· Braxton Hicks contractions are usually shorter, weaker, farther apart, and less regular than true labor contractions. True labor contractions usually become progressively stronger and regular and they become more frequent. °· Manage discomfort from Braxton Hicks contractions by  changing position, resting in a warm bath, drinking plenty of water, or practicing deep breathing. °This information is not intended to replace advice given to you by your health care provider. Make sure you discuss any questions you have with your health care provider. °Document Released: 06/13/2016 Document Revised: 06/13/2016 Document Reviewed: 06/13/2016 °Elsevier Interactive Patient Education © 2018 [employer] ° °

## 2017-04-16 NOTE — Progress Notes (Signed)
   04/16/17 1731  Fetal Heart Rate A  Mode External  Baseline Rate (A) 140 bpm  Variability 6-25 BPM  Accelerations 15 x 15  Decelerations None  Uterine Activity  Mode Toco  Contraction Frequency (min) none noted at this time  Resting Tone Palpated Relaxed  Cervical Exam  Dilation 1  Effacement (%) 50  Cervical Position Posterior  Cervical Consistency Medium  Station -3  Presentation Vertex  Exam by: Claudette LawsJaton Antonela Freiman rnc  Confirmed with pt that she was not feeling UCs - pt denies having UCs since being in the room. Report called to Dr Nira Retortegele.  Orders for d/c rec'd.

## 2017-04-21 ENCOUNTER — Telehealth: Payer: Self-pay | Admitting: *Deleted

## 2017-04-21 NOTE — Telephone Encounter (Signed)
Called pt to follow up with her putting in her CBG values in [employer] app. She states she has had trouble with her app since she started and was not able to save her values. She will put them in and then they are gone when she goes back into the app. I have contact [employer] to see if they can call her to fix this app.

## 2017-04-28 ENCOUNTER — Encounter (HOSPITAL_COMMUNITY): Payer: Self-pay

## 2017-04-28 ENCOUNTER — Inpatient Hospital Stay (HOSPITAL_COMMUNITY)
Admission: AD | Admit: 2017-04-28 | Discharge: 2017-04-28 | Disposition: A | Discharge status: Home / Self Care | Payer: Medicaid Other | Source: Ambulatory Visit | Attending: Obstetrics and Gynecology | Admitting: Obstetrics and Gynecology

## 2017-04-28 DIAGNOSIS — Z3A37 37 weeks gestation of pregnancy: Secondary | ICD-10-CM | POA: Diagnosis not present

## 2017-04-28 DIAGNOSIS — N898 Other specified noninflammatory disorders of vagina: Secondary | ICD-10-CM | POA: Diagnosis not present

## 2017-04-28 DIAGNOSIS — O9989 Other specified diseases and conditions complicating pregnancy, childbirth and the puerperium: Secondary | ICD-10-CM | POA: Diagnosis not present

## 2017-04-28 DIAGNOSIS — Z0371 Encounter for suspected problem with amniotic cavity and membrane ruled out: Secondary | ICD-10-CM | POA: Diagnosis present

## 2017-04-28 LAB — POCT FERN TEST: POCT Fern Test: NEGATIVE

## 2017-04-28 NOTE — [facility] Provider Note (Signed)
S: Barbara [name] is a 22 y.o. G2P1001 at 5525w5d  who presents to [facility]U today complaining of leaking of fluid since Saturday. She denies vaginal bleeding. She denies contractions. She reports normal fetal movement.    O: BP 135/83   Pulse 98   Temp 98.7 F (37.1 C) (Oral)   Resp 18   Ht 5\' 5"  (1.651 m)   Wt 253 lb (114.8 kg)   LMP 07/31/2016   BMI 42.10 kg/m  GENERAL: Well-developed, well-nourished female in no acute distress.  HEAD: Normocephalic, atraumatic.  CHEST: Normal effort of breathing, regular heart rate ABDOMEN: Soft, nontender, gravid PELVIC: Normal external female genitalia. Vagina is pink and rugated. Cervix with normal contour, no lesions. Normal discharge.  Negative pooling. Moderate amount of creamy white discharge.   Cervical exam: deferred   Fetal Monitoring: Baseline: 125 bpm Variability: Moderate Accelerations: 15x15 Decelerations: None Contractions: quiet   Results for orders placed or performed during the hospital encounter of 04/28/17 (from the past 24 hour(s))  POCT fern test     Status: None   Collection Time: 04/28/17  3:39 PM  Result Value Ref Range   POCT Fern Test Negative = intact amniotic membranes      A: SIUP at 6025w5d  Membranes intact  P:  Discharge home with strict return precautions  Labor precautions    Madaline Lefeber, Harolyn RutherfordJennifer I, NP 04/28/2017 3:36 PM

## 2017-04-28 NOTE — MAU Note (Signed)
Pt reports she has had leaking since Saturday. Discharge looks yellow now and has an odor. C/o mild cramping. Good fetal movement reported.

## 2017-04-28 NOTE — Discharge Instructions (Signed)

## 2017-04-29 ENCOUNTER — Ambulatory Visit (INDEPENDENT_AMBULATORY_CARE_PROVIDER_SITE_OTHER): Payer: Medicaid Other | Admitting: Family Medicine

## 2017-04-29 ENCOUNTER — Other Ambulatory Visit (HOSPITAL_COMMUNITY)
Admission: RE | Admit: 2017-04-29 | Discharge: 2017-04-29 | Disposition: A | Discharge status: Home / Self Care | Payer: Medicaid Other | Source: Ambulatory Visit | Attending: Obstetrics and Gynecology | Admitting: Obstetrics and Gynecology

## 2017-04-29 ENCOUNTER — Encounter: Payer: Self-pay | Admitting: Family Medicine

## 2017-04-29 VITALS — BP 127/80 | HR 106 | Wt 249.0 lb

## 2017-04-29 DIAGNOSIS — O24410 Gestational diabetes mellitus in pregnancy, diet controlled: Secondary | ICD-10-CM

## 2017-04-29 DIAGNOSIS — O0990 Supervision of high risk pregnancy, unspecified, unspecified trimester: Secondary | ICD-10-CM | POA: Insufficient documentation

## 2017-04-29 DIAGNOSIS — O0993 Supervision of high risk pregnancy, unspecified, third trimester: Secondary | ICD-10-CM

## 2017-04-29 DIAGNOSIS — Z3A00 Weeks of gestation of pregnancy not specified: Secondary | ICD-10-CM | POA: Diagnosis not present

## 2017-04-29 DIAGNOSIS — O3663X0 Maternal care for excessive fetal growth, third trimester, not applicable or unspecified: Secondary | ICD-10-CM

## 2017-04-29 NOTE — Patient Instructions (Signed)
Third Trimester of Pregnancy The third trimester is from week 28 through week 40 (months 7 through 9). The third trimester is a time when the unborn baby (fetus) is growing rapidly. At the end of the ninth month, the fetus is about 20 inches in length and weighs 6-10 pounds. Body changes during your third trimester Your body will continue to go through many changes during pregnancy. The changes vary from woman to woman. During the third trimester:  Your weight will continue to increase. You can expect to gain 25-35 pounds (11-16 kg) by the end of the pregnancy.  You may begin to get stretch marks on your hips, abdomen, and breasts.  You may urinate more often because the fetus is moving lower into your pelvis and pressing on your bladder.  You may develop or continue to have heartburn. This is caused by increased hormones that slow down muscles in the digestive tract.  You may develop or continue to have constipation because increased hormones slow digestion and cause the muscles that push waste through your intestines to relax.  You may develop hemorrhoids. These are swollen veins (varicose veins) in the rectum that can itch or be painful.  You may develop swollen, bulging veins (varicose veins) in your legs.  You may have increased body aches in the pelvis, back, or thighs. This is due to weight gain and increased hormones that are relaxing your joints.  You may have changes in your hair. These can include thickening of your hair, rapid growth, and changes in texture. Some women also have hair loss during or after pregnancy, or hair that feels dry or thin. Your hair will most likely return to normal after your baby is born.  Your breasts will continue to grow and they will continue to become tender. A yellow fluid (colostrum) may leak from your breasts. This is the first milk you are producing for your baby.  Your belly button may stick out.  You may notice more swelling in your hands,  face, or ankles.  You may have increased tingling or numbness in your hands, arms, and legs. The skin on your belly may also feel numb.  You may feel short of breath because of your expanding uterus.  You may have more problems sleeping. This can be caused by the size of your belly, increased need to urinate, and an increase in your body's metabolism.  You may notice the fetus "dropping," or moving lower in your abdomen (lightening).  You may have increased vaginal discharge.  You may notice your joints feel loose and you may have pain around your pelvic bone.  What to expect at prenatal visits You will have prenatal exams every 2 weeks until week 36. Then you will have weekly prenatal exams. During a routine prenatal visit:  You will be weighed to make sure you and the baby are growing normally.  Your blood pressure will be taken.  Your abdomen will be measured to track your baby's growth.  The fetal heartbeat will be listened to.  Any test results from the previous visit will be discussed.  You may have a cervical check near your due date to see if your cervix has softened or thinned (effaced).  You will be tested for Group B streptococcus. This happens between 35 and 37 weeks.  Your health care provider may ask you:  What your birth plan is.  How you are feeling.  If you are feeling the baby move.  If you have had   any abnormal symptoms, such as leaking fluid, bleeding, severe headaches, or abdominal cramping.  If you are using any tobacco products, including cigarettes, chewing tobacco, and electronic cigarettes.  If you have any questions.  Other tests or screenings that may be performed during your third trimester include:  Blood tests that check for low iron levels (anemia).  Fetal testing to check the health, activity level, and growth of the fetus. Testing is done if you have certain medical conditions or if there are problems during the  pregnancy.  Nonstress test (NST). This test checks the health of your baby to make sure there are no signs of problems, such as the baby not getting enough oxygen. During this test, a belt is placed around your belly. The baby is made to move, and its heart rate is monitored during movement.  What is false labor? False labor is a condition in which you feel small, irregular tightenings of the muscles in the womb (contractions) that usually go away with rest, changing position, or drinking water. These are called Braxton Hicks contractions. Contractions may last for hours, days, or even weeks before true labor sets in. If contractions come at regular intervals, become more frequent, increase in intensity, or become painful, you should see your health care provider. What are the signs of labor?  Abdominal cramps.  Regular contractions that start at 10 minutes apart and become stronger and more frequent with time.  Contractions that start on the top of the uterus and spread down to the lower abdomen and back.  Increased pelvic pressure and dull back pain.  A watery or bloody mucus discharge that comes from the vagina.  Leaking of amniotic fluid. This is also known as your "water breaking." It could be a slow trickle or a gush. Let your health care provider know if it has a color or strange odor. If you have any of these signs, call your health care provider right away, even if it is before your due date. Follow these instructions at home: Medicines  Follow your health care provider's instructions regarding medicine use. Specific medicines may be either safe or unsafe to take during pregnancy.  Take a prenatal vitamin that contains at least 600 micrograms (mcg) of folic acid.  If you develop constipation, try taking a stool softener if your health care provider approves. Eating and drinking  Eat a balanced diet that includes fresh fruits and vegetables, whole grains, good sources of protein  such as meat, eggs, or tofu, and low-fat dairy. Your health care provider will help you determine the amount of weight gain that is right for you.  Avoid raw meat and uncooked cheese. These carry germs that can cause birth defects in the baby.  If you have low calcium intake from food, talk to your health care provider about whether you should take a daily calcium supplement.  Eat four or five small meals rather than three large meals a day.  Limit foods that are high in fat and processed sugars, such as fried and sweet foods.  To prevent constipation: ? Drink enough fluid to keep your urine clear or pale yellow. ? Eat foods that are high in fiber, such as fresh fruits and vegetables, whole grains, and beans. Activity  Exercise only as directed by your health care provider. Most women can continue their usual exercise routine during pregnancy. Try to exercise for 30 minutes at least 5 days a week. Stop exercising if you experience uterine contractions.  Avoid heavy   lifting.  Do not exercise in extreme heat or humidity, or at high altitudes.  Wear low-heel, comfortable shoes.  Practice good posture.  You may continue to have sex unless your health care provider tells you otherwise. Relieving pain and discomfort  Take frequent breaks and rest with your legs elevated if you have leg cramps or low back pain.  Take warm sitz baths to soothe any pain or discomfort caused by hemorrhoids. Use hemorrhoid cream if your health care provider approves.  Wear a good support bra to prevent discomfort from breast tenderness.  If you develop varicose veins: ? Wear support pantyhose or compression stockings as told by your healthcare provider. ? Elevate your feet for 15 minutes, 3-4 times a day. Prenatal care  Write down your questions. Take them to your prenatal visits.  Keep all your prenatal visits as told by your health care provider. This is important. Safety  Wear your seat belt at  all times when driving.  Make a list of emergency phone numbers, including numbers for family, friends, the hospital, and police and fire departments. General instructions  Avoid cat litter boxes and soil used by cats. These carry germs that can cause birth defects in the baby. If you have a cat, ask someone to clean the litter box for you.  Do not travel far distances unless it is absolutely necessary and only with the approval of your health care provider.  Do not use hot tubs, steam rooms, or saunas.  Do not drink alcohol.  Do not use any products that contain nicotine or tobacco, such as cigarettes and e-cigarettes. If you need help quitting, ask your health care provider.  Do not use any medicinal herbs or unprescribed drugs. These chemicals affect the formation and growth of the baby.  Do not douche or use tampons or scented sanitary pads.  Do not cross your legs for long periods of time.  To prepare for the arrival of your baby: ? Take prenatal classes to understand, practice, and ask questions about labor and delivery. ? Make a trial run to the hospital. ? Visit the hospital and tour the maternity area. ? Arrange for maternity or paternity leave through employers. ? Arrange for family and friends to take care of pets while you are in the hospital. ? Purchase a rear-facing car seat and make sure you know how to install it in your car. ? Pack your hospital bag. ? Prepare the baby's nursery. Make sure to remove all pillows and stuffed animals from the baby's crib to prevent suffocation.  Visit your dentist if you have not gone during your pregnancy. Use a soft toothbrush to brush your teeth and be gentle when you floss. Contact a health care provider if:  You are unsure if you are in labor or if your water has broken.  You become dizzy.  You have mild pelvic cramps, pelvic pressure, or nagging pain in your abdominal area.  You have lower back pain.  You have persistent  nausea, vomiting, or diarrhea.  You have an unusual or bad smelling vaginal discharge.  You have pain when you urinate. Get help right away if:  Your water breaks before 37 weeks.  You have regular contractions less than 5 minutes apart before 37 weeks.  You have a fever.  You are leaking fluid from your vagina.  You have spotting or bleeding from your vagina.  You have severe abdominal pain or cramping.  You have rapid weight loss or weight gain.    You have shortness of breath with chest pain.  You notice sudden or extreme swelling of your face, hands, ankles, feet, or legs.  Your baby makes fewer than 10 movements in 2 hours.  You have severe headaches that do not go away when you take medicine.  You have vision changes. Summary  The third trimester is from week 28 through week 40, months 7 through 9. The third trimester is a time when the unborn baby (fetus) is growing rapidly.  During the third trimester, your discomfort may increase as you and your baby continue to gain weight. You may have abdominal, leg, and back pain, sleeping problems, and an increased need to urinate.  During the third trimester your breasts will keep growing and they will continue to become tender. A yellow fluid (colostrum) may leak from your breasts. This is the first milk you are producing for your baby.  False labor is a condition in which you feel small, irregular tightenings of the muscles in the womb (contractions) that eventually go away. These are called Braxton Hicks contractions. Contractions may last for hours, days, or even weeks before true labor sets in.  Signs of labor can include: abdominal cramps; regular contractions that start at 10 minutes apart and become stronger and more frequent with time; watery or bloody mucus discharge that comes from the vagina; increased pelvic pressure and dull back pain; and leaking of amniotic fluid. This information is not intended to replace advice  given to you by your health care provider. Make sure you discuss any questions you have with your health care provider. Document Released: 01/22/2001 Document Revised: 07/06/2015 Document Reviewed: 03/31/2012 Elsevier Interactive Patient Education  2017 [employer]  

## 2017-04-29 NOTE — Progress Notes (Signed)
Pt is not logging CBG's in [facility] app. She did not bring sugar log with her today but states the "fastings are in the 80's".  She c/o abnormal yellow vaginal discharge and would like that evaluated today along with a cervical check.  She missed her follow up ultrasound and has rescheduled it for Friday.

## 2017-04-29 NOTE — Progress Notes (Signed)
    PRENATAL VISIT NOTE  Subjective:  Barbara [name] is a 22 y.o. G2P1001 at 3810w6d being seen today for ongoing prenatal care.  She is currently monitored for the following issues for this high-risk pregnancy and has Depressed mood with postpartum onset; Supervision of high risk pregnancy, antepartum; Narcolepsy; UTI (urinary tract infection) in pregnancy, antepartum; Family history of cleft palate; LGA (large for gestational age) fetus affecting management of mother; and GDM (gestational diabetes mellitus) on their problem list.  Patient reports no complaints.  Contractions: Irregular. Vag. Bleeding: None.  Movement: Present. Denies leaking of fluid.   The following portions of the patient's history were reviewed and updated as appropriate: allergies, current medications, past family history, past medical history, past social history, past surgical history and problem list. Problem list updated.  Objective:   Vitals:   04/29/17 1046  BP: 127/80  Pulse: (!) 106  Weight: 249 lb (112.9 kg)    Fetal Status: Fetal Heart Rate (bpm): 140 Fundal Height: 41 cm Movement: Present  Presentation: Vertex  General:  Alert, oriented and cooperative. Patient is in no acute distress.  Skin: Skin is warm and dry. No rash noted.   Cardiovascular: Normal heart rate noted  Respiratory: Normal respiratory effort, no problems with respiration noted  Abdomen: Soft, gravid, appropriate for gestational age.  Pain/Pressure: Present     Pelvic: Cervical exam performed Dilation: 2 Effacement (%): 50 Station: -2  Extremities: Normal range of motion.  Edema: Trace  Mental Status:  Normal mood and affect. Normal behavior. Normal judgment and thought content.   Assessment and Plan:  Pregnancy: G2P1001 at 410w6d  1. Supervision of high risk pregnancy, antepartum Cultures today - Cervicovaginal ancillary only - Strep Gp B NAA  2. Diet controlled gestational diabetes mellitus (GDM) in third trimester No book  today Reports fastings in the 3480s and pp 110-120 ish Continue diet  3. Excessive fetal growth affecting management of pregnancy in third trimester, single or unspecified fetus F/u growth this week. Will need to decide discuss delivery plan when next seen. She has h/o 8+ lb SVD with no issues last pregnancy.  Preterm labor symptoms and general obstetric precautions including but not limited to vaginal bleeding, contractions, leaking of fluid and fetal movement were reviewed in detail with the patient. Please refer to After Visit Summary for other counseling recommendations.  Return in 1 week (on 05/06/2017).   Reva Boresanya S Braya Habermehl, MD

## 2017-04-30 LAB — CERVICOVAGINAL ANCILLARY ONLY
BACTERIAL VAGINITIS: NEGATIVE
CANDIDA VAGINITIS: NEGATIVE
CHLAMYDIA, DNA PROBE: NEGATIVE
NEISSERIA GONORRHEA: NEGATIVE
Trichomonas: NEGATIVE

## 2017-05-01 ENCOUNTER — Encounter (HOSPITAL_COMMUNITY): Payer: Self-pay

## 2017-05-01 ENCOUNTER — Inpatient Hospital Stay (HOSPITAL_COMMUNITY)
Admission: AD | Admit: 2017-05-01 | Discharge: 2017-05-04 | DRG: 807 | Disposition: A | Discharge status: Home / Self Care | Payer: Medicaid Other | Source: Ambulatory Visit | Attending: Obstetrics & Gynecology | Admitting: Obstetrics & Gynecology

## 2017-05-01 ENCOUNTER — Other Ambulatory Visit: Payer: Self-pay

## 2017-05-01 ENCOUNTER — Inpatient Hospital Stay (HOSPITAL_COMMUNITY): Payer: Medicaid Other

## 2017-05-01 DIAGNOSIS — Z3A38 38 weeks gestation of pregnancy: Secondary | ICD-10-CM

## 2017-05-01 DIAGNOSIS — O139 Gestational [pregnancy-induced] hypertension without significant proteinuria, unspecified trimester: Secondary | ICD-10-CM | POA: Diagnosis present

## 2017-05-01 DIAGNOSIS — O134 Gestational [pregnancy-induced] hypertension without significant proteinuria, complicating childbirth: Secondary | ICD-10-CM | POA: Diagnosis present

## 2017-05-01 DIAGNOSIS — O322XX0 Maternal care for transverse and oblique lie, not applicable or unspecified: Secondary | ICD-10-CM | POA: Diagnosis present

## 2017-05-01 DIAGNOSIS — O99824 Streptococcus B carrier state complicating childbirth: Secondary | ICD-10-CM | POA: Diagnosis present

## 2017-05-01 DIAGNOSIS — O24410 Gestational diabetes mellitus in pregnancy, diet controlled: Secondary | ICD-10-CM

## 2017-05-01 DIAGNOSIS — O24420 Gestational diabetes mellitus in childbirth, diet controlled: Secondary | ICD-10-CM | POA: Diagnosis present

## 2017-05-01 DIAGNOSIS — O99214 Obesity complicating childbirth: Secondary | ICD-10-CM | POA: Diagnosis present

## 2017-05-01 DIAGNOSIS — O133 Gestational [pregnancy-induced] hypertension without significant proteinuria, third trimester: Secondary | ICD-10-CM

## 2017-05-01 DIAGNOSIS — O0990 Supervision of high risk pregnancy, unspecified, unspecified trimester: Secondary | ICD-10-CM

## 2017-05-01 DIAGNOSIS — O99345 Other mental disorders complicating the puerperium: Secondary | ICD-10-CM

## 2017-05-01 DIAGNOSIS — O3663X0 Maternal care for excessive fetal growth, third trimester, not applicable or unspecified: Secondary | ICD-10-CM

## 2017-05-01 DIAGNOSIS — F53 Postpartum depression: Secondary | ICD-10-CM

## 2017-05-01 DIAGNOSIS — Z8759 Personal history of other complications of pregnancy, childbirth and the puerperium: Secondary | ICD-10-CM | POA: Diagnosis present

## 2017-05-01 LAB — COMPREHENSIVE METABOLIC PANEL
ALK PHOS: 119 U/L (ref 38–126)
ALT: 28 U/L (ref 14–54)
ANION GAP: 10 (ref 5–15)
AST: 39 U/L (ref 15–41)
Albumin: 2.9 g/dL — ABNORMAL LOW (ref 3.5–5.0)
BUN: 10 mg/dL (ref 6–20)
CALCIUM: 8.9 mg/dL (ref 8.9–10.3)
CO2: 19 mmol/L — AB (ref 22–32)
Chloride: 104 mmol/L (ref 101–111)
Creatinine, Ser: 0.57 mg/dL (ref 0.44–1.00)
GFR calc Af Amer: >60 mL/min (ref >60)
GFR calc non Af Amer: >60 mL/min (ref >60)
Glucose, Bld: 118 mg/dL — ABNORMAL HIGH (ref 65–99)
Potassium: 3.9 mmol/L (ref 3.5–5.1)
SODIUM: 133 mmol/L — AB (ref 135–145)
Total Bilirubin: 0.3 mg/dL (ref 0.3–1.2)
Total Protein: 6.4 g/dL — ABNORMAL LOW (ref 6.5–8.1)

## 2017-05-01 LAB — PROTEIN / CREATININE RATIO, URINE
Creatinine, Urine: 168 mg/dL
PROTEIN CREATININE RATIO: 0.11 mg/mg{creat} (ref 0.00–0.15)
Total Protein, Urine: 18 mg/dL

## 2017-05-01 LAB — URINALYSIS, ROUTINE W REFLEX MICROSCOPIC
Bilirubin Urine: NEGATIVE
GLUCOSE, UA: NEGATIVE mg/dL
Hgb urine dipstick: NEGATIVE
Ketones, ur: 15 mg/dL — AB
Nitrite: NEGATIVE
PROTEIN: NEGATIVE mg/dL
pH: 6 (ref 5.0–8.0)

## 2017-05-01 LAB — TYPE AND SCREEN
ABO/RH(D): A POS
Antibody Screen: NEGATIVE

## 2017-05-01 LAB — CBC
HEMATOCRIT: 36.7 % (ref 36.0–46.0)
HEMATOCRIT: 36.2 % (ref 36.0–46.0)
HEMOGLOBIN: 12.6 g/dL (ref 12.0–15.0)
Hemoglobin: 12.2 g/dL (ref 12.0–15.0)
MCH: 28.4 pg (ref 26.0–34.0)
MCH: 27.9 pg (ref 26.0–34.0)
MCHC: 34.3 g/dL (ref 30.0–36.0)
MCHC: 33.7 g/dL (ref 30.0–36.0)
MCV: 82.8 fL (ref 78.0–100.0)
MCV: 82.8 fL (ref 78.0–100.0)
Platelets: 211 10*3/uL (ref 150–400)
Platelets: 214 10*3/uL (ref 150–400)
RBC: 4.43 MIL/uL (ref 3.87–5.11)
RBC: 4.37 MIL/uL (ref 3.87–5.11)
RDW: 14.4 % (ref 11.5–15.5)
RDW: 14.4 % (ref 11.5–15.5)
WBC: 10.6 10*3/uL — ABNORMAL HIGH (ref 4.0–10.5)
WBC: 9.7 10*3/uL (ref 4.0–10.5)

## 2017-05-01 LAB — URINALYSIS, MICROSCOPIC (REFLEX)

## 2017-05-01 LAB — STREP GP B NAA: Strep Gp B NAA: POSITIVE — AB

## 2017-05-01 MED ORDER — ZOLPIDEM TARTRATE 5 MG PO TABS
5.0000 mg | ORAL_TABLET | Freq: Every evening | ORAL | Status: DC | PRN
  Administered 2017-05-01: 5 mg via ORAL
  Filled 2017-05-01: qty 1

## 2017-05-01 MED ORDER — OXYTOCIN 40 UNITS IN LACTATED RINGERS INFUSION - SIMPLE MED
1.0000 m[IU]/min | INTRAVENOUS | Status: DC
  Administered 2017-05-02: 2 m[IU]/min via INTRAVENOUS
  Filled 2017-05-01: qty 1000

## 2017-05-01 MED ORDER — LACTATED RINGERS IV SOLN
INTRAVENOUS | Status: DC
  Administered 2017-05-01 – 2017-05-02 (×2): via INTRAVENOUS

## 2017-05-01 MED ORDER — LACTATED RINGERS IV SOLN: 500.0000 mL | INTRAVENOUS | Status: DC | PRN

## 2017-05-01 MED ORDER — TERBUTALINE SULFATE 1 MG/ML IJ SOLN
0.2500 mg | Freq: Once | INTRAMUSCULAR | Status: DC | PRN
  Filled 2017-05-01: qty 1

## 2017-05-01 MED ORDER — PENICILLIN G POT IN DEXTROSE 60000 UNIT/ML IV SOLN
3.0000 10*6.[IU] | INTRAVENOUS | Status: DC
  Administered 2017-05-02 (×2): 3 10*6.[IU] via INTRAVENOUS
  Filled 2017-05-01 (×6): qty 50

## 2017-05-01 MED ORDER — ONDANSETRON HCL 4 MG/2ML IJ SOLN
4.0000 mg | Freq: Four times a day (QID) | INTRAMUSCULAR | Status: DC | PRN
  Administered 2017-05-02: 4 mg via INTRAVENOUS
  Filled 2017-05-01: qty 2

## 2017-05-01 MED ORDER — LIDOCAINE HCL (PF) 1 % IJ SOLN
30.0000 mL | INTRAMUSCULAR | Status: DC | PRN
  Filled 2017-05-01: qty 30

## 2017-05-01 MED ORDER — SODIUM CHLORIDE 0.9 % IV SOLN
5.0000 10*6.[IU] | Freq: Once | INTRAVENOUS | Status: AC
  Administered 2017-05-01: 5 10*6.[IU] via INTRAVENOUS
  Filled 2017-05-01: qty 5

## 2017-05-01 MED ORDER — SOD CITRATE-CITRIC ACID 500-334 MG/5ML PO SOLN: 30.0000 mL | ORAL | Status: DC | PRN

## 2017-05-01 MED ORDER — OXYTOCIN 40 UNITS IN LACTATED RINGERS INFUSION - SIMPLE MED: 2.5000 [IU]/h | INTRAVENOUS | Status: DC

## 2017-05-01 MED ORDER — OXYTOCIN BOLUS FROM INFUSION
500.0000 mL | Freq: Once | INTRAVENOUS | Status: AC
  Administered 2017-05-02: 500 mL via INTRAVENOUS

## 2017-05-01 NOTE — MAU Provider Note (Signed)
Chief Complaint:  No chief complaint on file.   First Provider Initiated Contact with Patient 05/01/17 1902      HPI: Barbara [name] is a 22 y.o. G2P1001 at 33w1dwho presents to maternity admissions reporting sadness and feelings of depression starting today.  She has hx of postpartum depression with her first baby but has not required any treatment for depression since then. [facility] but has been doing well.  She had an [place] 30 weeks with EFW 90%tile and she is very worried that the baby is too big. She is diet controlled GDM and reports her blood sugars have been normal, with only 1 or 2 in the 120s in the last few weeks, none higher than that.  She is tearful in MAU and just reports she wants to have the baby and make sure everything is Ok.  She denies any thoughts of harming herself or others.  She feels safe.   She reports good fetal movement, denies cramping/contractions, LOF, vaginal bleeding, vaginal itching/burning, urinary symptoms, h/a, dizziness, n/v, or fever/chills.    HPI  Past Medical History: Past Medical History:  Diagnosis Date  . Anxiety   . Back pain   . Depression    postpartum depression  . Gestational diabetes    diet controlled  . [place]   Occasional, evaluated by Neurology  . 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 UTI (urinary tract infection)     Past obstetric history: OB History  Gravida Para Term Preterm AB Living  '2 1 1     1  '$ SAB TAB Ectopic Multiple Live Births          1    # Outcome Date GA Lbr Len/2nd Weight Sex Delivery Anes PTL Lv  2 Current           1 Term 02/24/13 32w3d2:29 / 00:19 8 lb 6.4 oz (3.81 kg) M Vag-Spont EPI  LIV    Past Surgical History: Past Surgical History:  Procedure Laterality Date  . NO PAST SURGERIES      Family History: Family History  Problem Relation  Age of Onset  . Depression Mother   . Healthy Brother   . Healthy Sister   . Hypertension Maternal Grandmother   . Depression Maternal Grandmother   . Asthma Maternal Grandmother   . Diabetes Maternal Grandmother   . Heart disease Maternal Grandmother   . Hearing loss Maternal Grandfather   . COPD Maternal Grandfather     Social History: Social History   Tobacco Use  . Smoking status: Never Smoker  . Smokeless tobacco: Never Used  Substance Use Topics  . Alcohol use: No    Alcohol/week: 0.0 oz  . Drug use: No    Allergies:  Allergies  Allergen Reactions  . Corn-Containing Products Anaphylaxis and Swelling    Pt states that it is the silk that she is allergic to.  . Other Itching and Nausea And Vomiting    SHERBET.  . Marland Kitchenercocet [Oxycodone-Acetaminophen] Other (See Comments)    States feels like chest is tightening up.    Meds:  Medications Prior to Admission  Medication Sig Dispense Refill Last Dose  . ACCU-CHEK FASTCLIX LANCETS MISC 1 Units by Percutaneous route 4 (four) times daily. 100 each 12 Taking  . acetaminophen (TYLENOL) 500 MG tablet Take 500 mg by  mouth every 6 (six) hours as needed for moderate pain or headache.    Taking  . Blood Glucose Monitoring Suppl (ACCU-CHEK GUIDE) w/Device KIT 1 Device by Does not apply route 4 (four) times daily. 1 kit 0 Taking  . cyclobenzaprine (FLEXERIL) 10 MG tablet Take 1 tablet (10 mg total) by mouth 2 (two) times daily as needed for muscle spasms. 20 tablet 0 Taking  . docusate sodium (COLACE) 100 MG capsule Take 1 capsule (100 mg total) by mouth every 12 (twelve) hours. 60 capsule 0 Taking  . Doxylamine-Pyridoxine 10-10 MG TBEC Take at bedtime and repeat in the morning if still nausea 180 tablet 3 Taking  . glucose blood (ACCU-CHEK GUIDE) test strip Use to check blood sugars four times a day was instructed 50 each 12 Taking  . hydrocortisone-pramoxine (PROCTOFOAM HC) rectal foam Place 1 applicator rectally 2 (two) times daily.  10 g 5 Taking  . Prenat-FeAsp-Meth-FA-DHA w/o A (PRENATE PIXIE) 10-0.6-0.4-200 MG CAPS Take 1 capsule by mouth daily. 30 capsule 15 Taking    ROS:  Review of Systems  Constitutional: Negative for chills, fatigue and fever.  Eyes: Negative for visual disturbance.  Respiratory: Negative for shortness of breath.   Cardiovascular: Negative for chest pain.  Gastrointestinal: Negative for abdominal pain, nausea and vomiting.  Genitourinary: Negative for difficulty urinating, dysuria, flank pain, pelvic pain, vaginal bleeding, vaginal discharge and vaginal pain.  Neurological: Negative for dizziness and headaches.  Psychiatric/Behavioral: Negative.      I have reviewed patient's Past Medical Hx, Surgical Hx, Family Hx, Social Hx, medications and allergies.   Physical Exam   Patient Vitals for the past 24 hrs:  BP Temp Temp src Pulse Resp SpO2 Weight  05/01/17 1930 132/80 - - 97 - - -  05/01/17 1925 138/78 - - 88 - - -  05/01/17 1900 138/78 - - 88 - - -  05/01/17 1815 (!) 144/84 98.3 F (36.8 C) Oral (!) 107 18 99 % 249 lb 4 oz (113.1 kg)   Constitutional: Well-developed, well-nourished female in no acute distress.  Cardiovascular: normal rate Respiratory: normal effort GI: Abd soft, non-tender, gravid appropriate for gestational age.  MS: Extremities nontender, no edema, normal ROM Neurologic: Alert and oriented x 4.  GU: Neg CVAT.      FHT:  Baseline 145 , moderate variability, accelerations present, no decelerations Contractions: None on toco or to palpation   Labs: No results found for this or any previous visit (from the past 24 hour(s)). A/Positive/-- (08/09 1110)  Imaging:  No results found.  MAU Course/MDM: Pt with depressive symptoms, no thoughts of harming herself or others. Most of her depression stems from worry about this pregnancy and the size of this baby.   Her BP is elevated today and was elevated on her MAU visit on 04/28/17.  She denies h/a, epigastric  pain, or visual disturbances. Consult Dr Rosana Hoes with assessment and findings. Pt meets criteria for IOL for GHTN based on elevated BP x 2, 6 hours apart.   [place] ordered for EFW tonight IOL or elective C/S offered based on US findings Pt desires vaginal delivery unless medically too risky  Assessment: 1. Gestational hypertension, third trimester   2. Gestational hypertension   3. Diet controlled gestational diabetes mellitus (GDM) in third trimester   4. Excessive fetal growth affecting management of pregnancy in third trimester, single or unspecified fetus     Plan: Admit for delivery for GHTN Will offer elective C/S if >4500g for GDM  Fatima Blank Certified Nurse-Midwife 05/01/2017 8:10 PM

## 2017-05-01 NOTE — MAU Note (Addendum)
Feeling depressed.  Just started today.  (became tearful) is just tired of being preg.- baby is big.  Talked with her dr, was told to come up here.  occ ctx's, no bleeding or leaking.  Not feeling like she is going to hurt herself, the baby or anyone else, she is just tired and uncomfortable and wants to have the baby

## 2017-05-01 NOTE — H&P (Signed)
Barbara [name] is a 22 y.o. female G2P1001 with IUP at 36w1dpresenting for IOL for GHTN. [facility] History/Complications:  SVD 85#0DTterm baby w/o problems A1DM this pregnancy   Presented to MAU this evening w/c/o anxiety about size of baby and depression. Was found to meet criteria for GHTN, so IOL.   Past Medical History: Past Medical History:  Diagnosis Date  . Anxiety   . Back pain   . Depression    postpartum depression  . Gestational diabetes    diet controlled  . [place]   Occasional, evaluated by Neurology  . 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 UTI (urinary tract infection)     Past Surgical History: Past Surgical History:  Procedure Laterality Date  . NO PAST SURGERIES      Obstetrical History: OB History    Gravida  2   Para  1   Term  1   Preterm      AB      Living  1     SAB      TAB      Ectopic      Multiple      Live Births  1           Social History: Social History   Socioeconomic History  . Marital status: Married    Spouse name: Not on file  . Number of children: 1  . Years of education: 141 . Highest education level: Not on file  Occupational History  . Occupation: unemployed  Social Needs  . Financial resource strain: Not on file  . Food insecurity:    Worry: Not on file    Inability: Not on file  . Transportation needs:    Medical: Not on file    Non-medical: Not on file  Tobacco Use  . Smoking status: Never Smoker  . Smokeless tobacco: Never Used  Substance and Sexual Activity  . Alcohol use: No    Alcohol/week: 0.0 oz  . Drug use: No  . Sexual activity: Yes    Partners: Male  Lifestyle  . Physical activity:    Days per week: Not on file    Minutes per session: Not on file  . Stress: Not on file  Relationships  . Social connections:    Talks on phone: Not on file    Gets together: Not on file    Attends religious  service: Not on file    Active member of club or organization: Not on file    Attends meetings of clubs or organizations: Not on file    Relationship status: Not on file  Other Topics Concern  . Not on file  Social History Narrative   Patient does not drink caffeine.   Patient is right handed.    Family History: Family History  Problem Relation Age of Onset  . Depression Mother   . Healthy Brother   . Healthy Sister   . Hypertension Maternal Grandmother   . Depression Maternal Grandmother   . Asthma Maternal Grandmother   . Diabetes Maternal Grandmother   . Heart disease Maternal Grandmother   . Hearing loss Maternal Grandfather   . COPD Maternal Grandfather     Allergies: Allergies  Allergen Reactions  . Corn-Containing Products Anaphylaxis and Swelling    Pt states that it is the silk that she is allergic to.  . Other Itching and  Nausea And Vomiting    SHERBET.  Marland Kitchen Percocet [Oxycodone-Acetaminophen] Other (See Comments)    States feels like chest is tightening up.    Medications Prior to Admission  Medication Sig Dispense Refill Last Dose  . ACCU-CHEK FASTCLIX LANCETS MISC 1 Units by Percutaneous route 4 (four) times daily. 100 each 12 Taking  . acetaminophen (TYLENOL) 500 MG tablet Take 500 mg by mouth every 6 (six) hours as needed for moderate pain or headache.    Taking  . Blood Glucose Monitoring Suppl (ACCU-CHEK GUIDE) w/Device KIT 1 Device by Does not apply route 4 (four) times daily. 1 kit 0 Taking  . cyclobenzaprine (FLEXERIL) 10 MG tablet Take 1 tablet (10 mg total) by mouth 2 (two) times daily as needed for muscle spasms. 20 tablet 0 Taking  . docusate sodium (COLACE) 100 MG capsule Take 1 capsule (100 mg total) by mouth every 12 (twelve) hours. 60 capsule 0 Taking  . Doxylamine-Pyridoxine 10-10 MG TBEC Take at bedtime and repeat in the morning if still nausea 180 tablet 3 Taking  . glucose blood (ACCU-CHEK GUIDE) test strip Use to check blood sugars four times  a day was instructed 50 each 12 Taking  . hydrocortisone-pramoxine (PROCTOFOAM HC) rectal foam Place 1 applicator rectally 2 (two) times daily. 10 g 5 Taking  . Prenat-FeAsp-Meth-FA-DHA w/o A (PRENATE PIXIE) 10-0.6-0.4-200 MG CAPS Take 1 capsule by mouth daily. 30 capsule 15 Taking    Review of Systems   Constitutional: Negative for fever and chills Eyes: Negative for visual disturbances Respiratory: Negative for shortness of breath, dyspnea Cardiovascular: Negative for chest pain or palpitations  Gastrointestinal: Negative for abdominal pain, vomiting, diarrhea and constipation.   Genitourinary: Negative for dysuria and urgency Musculoskeletal: Negative for back pain, joint pain, myalgias  Neurological: Negative for dizziness and headaches   Blood pressure 138/89, pulse 91, temperature 98.1 F (36.7 C), temperature source Oral, resp. rate 20, height _0  (1.651 m), weight 113.1 kg (249 lb 4 oz), last menstrual period 07/31/2016, SpO2 99 %. General appearance: alert, cooperative and no distress Lungs: clear to auscultation bilaterally Heart: regular rate and rhythm Abdomen: soft, non-tender; bowel sounds normal Extremities: Homans sign is negative, no sign of DVT DTR's 2+ Presentation: cephalic Fetal monitoring  Baseline: 140 bpm, Variability: Good {> 6 bpm), Accelerations: Reactive and Decelerations: Absent Uterine activity  Irregular on EFM, not felt by pt  Dilation: 2 Exam by:: F. Cresenzo-Dishmon, CNM   Prenatal labs: ABO, Rh: --/--/A POS (03/21 2150) Antibody: NEG (03/21 2150) Rubella: immune RPR: Non Reactive (01/31 0930)  HBsAg: Negative (08/09 1110)  HIV: Non Reactive (08/09 1110)  GBS: Positive (03/19 1045)   Prenatal Transfer Tool  Maternal Diabetes: Yes:  Diabetes Type:  Diet controlled Genetic Screening: Normal Maternal Ultrasounds/Referrals: Normal Fetal Ultrasounds or other Referrals:  None Maternal Substance Abuse:  No Significant Maternal Medications:   None Significant Maternal Lab Results: Lab values include: Group B Strep positive    Results for orders placed or performed during the hospital encounter of 05/01/17 (from the past 24 hour(s))  Protein / creatinine ratio, urine   Collection Time: 05/01/17  6:13 PM  Result Value Ref Range   Creatinine, Urine 168.00 mg/dL   Total Protein, Urine 18 mg/dL   Protein Creatinine Ratio 0.11 0.00 - 0.15 mg/mg[Cre]  Urinalysis, Routine w reflex microscopic   Collection Time: 05/01/17  6:18 PM  Result Value Ref Range   Color, Urine YELLOW YELLOW   APPearance CLOUDY (A) CLEAR  Specific Gravity, Urine >1.030 (H) 1.005 - 1.030   pH 6.0 5.0 - 8.0   Glucose, UA NEGATIVE NEGATIVE mg/dL   Hgb urine dipstick NEGATIVE NEGATIVE   Bilirubin Urine NEGATIVE NEGATIVE   Ketones, ur 15 (A) NEGATIVE mg/dL   Protein, ur NEGATIVE NEGATIVE mg/dL   Nitrite NEGATIVE NEGATIVE   Leukocytes, UA TRACE (A) NEGATIVE  Urinalysis, Microscopic (reflex)   Collection Time: 05/01/17  6:18 PM  Result Value Ref Range   RBC / HPF 0-5 0 - 5 RBC/hpf   WBC, UA 6-30 0 - 5 WBC/hpf   Bacteria, UA MANY (A) NONE SEEN   Squamous Epithelial / LPF 6-30 (A) NONE SEEN   Mucus PRESENT    Amorphous Crystal PRESENT    Ca Oxalate Crys, UA PRESENT   CBC   Collection Time: 05/01/17  8:26 PM  Result Value Ref Range   WBC 10.6 (H) 4.0 - 10.5 K/uL   RBC 4.43 3.87 - 5.11 MIL/uL   Hemoglobin 12.6 12.0 - 15.0 g/dL   HCT 36.7 36.0 - 46.0 %   MCV 82.8 78.0 - 100.0 fL   MCH 28.4 26.0 - 34.0 pg   MCHC 34.3 30.0 - 36.0 g/dL   RDW 14.4 11.5 - 15.5 %   Platelets 211 150 - 400 K/uL  Comprehensive metabolic panel   Collection Time: 05/01/17  8:26 PM  Result Value Ref Range   Sodium 133 (L) 135 - 145 mmol/L   Potassium 3.9 3.5 - 5.1 mmol/L   Chloride 104 101 - 111 mmol/L   CO2 19 (L) 22 - 32 mmol/L   Glucose, Bld 118 (H) 65 - 99 mg/dL   BUN 10 6 - 20 mg/dL   Creatinine, Ser 0.57 0.44 - 1.00 mg/dL   Calcium 8.9 8.9 - 10.3 mg/dL   Total  Protein 6.4 (L) 6.5 - 8.1 g/dL   Albumin 2.9 (L) 3.5 - 5.0 g/dL   AST 39 15 - 41 U/L   ALT 28 14 - 54 U/L   Alkaline Phosphatase 119 38 - 126 U/L   Total Bilirubin 0.3 0.3 - 1.2 mg/dL   GFR calc non Af Amer >60 >60 mL/min   GFR calc Af Amer >60 >60 mL/min   Anion gap 10 5 - 15  CBC   Collection Time: 05/01/17  9:50 PM  Result Value Ref Range   WBC 9.7 4.0 - 10.5 K/uL   RBC 4.37 3.87 - 5.11 MIL/uL   Hemoglobin 12.2 12.0 - 15.0 g/dL   HCT 36.2 36.0 - 46.0 %   MCV 82.8 78.0 - 100.0 fL   MCH 27.9 26.0 - 34.0 pg   MCHC 33.7 30.0 - 36.0 g/dL   RDW 14.4 11.5 - 15.5 %   Platelets 214 150 - 400 K/uL  Type and screen [place]   Collection Time: 05/01/17  9:50 PM  Result Value Ref Range   ABO/RH(D) A POS    Antibody Screen NEG    Sample Expiration      05/04/2017 Performed at [facility], [address], [place], [place] [address]   Removed from [facility] Prenatal Labs  Dating LMP Blood type: A/Positive/-- (08/09 1110) A pos  Genetic Screen 1st trimester: WNL   AFP 01/07/2017: WNL Antibody:Negative (08/09 1110)neg  Anatomic [place] Normal,  Rubella: 3.21 (08/09 1110)Immune  GTT Early:5.0                Third trimester: 94/90/90 - abnormal RPR: Non Reactive (08/09  1110) NR  Flu vaccine 02/05/17 HBsAg: Negative (08/09 1110) Neg  TDaP vaccine Declined HIV:   NR  Baby Food Breast                                            GBS: positive  Contraception Undecided 01/07/17 Pap: 05/12/15 normal  Circumcision Desires   Pediatrician [facility] CF: Negative (tested in 2014)  Support Person FOB: Brandon SMA neg  Prenatal Classes  Hgb electrophoresis:   EFW 8# 14 oz today.  Assessment: Barbara [name] is a 22 y.o. G2P1001 with an IUP at 43w1dpresenting for IOL for GHTN.  Plan: #Labor:Foley->pitocin #Pain:  Per request #FWB Cat 1 #ID: GBS: PCN, start now  SW consult after delivery   FChristin Fudge3/21/2019, 11:15 PM

## 2017-05-01 NOTE — MAU Note (Signed)
Urine sent to lab 

## 2017-05-02 ENCOUNTER — Ambulatory Visit (HOSPITAL_COMMUNITY): Admission: RE | Admit: 2017-05-02 | Payer: Medicaid Other | Source: Ambulatory Visit

## 2017-05-02 ENCOUNTER — Encounter (HOSPITAL_COMMUNITY): Payer: Self-pay | Admitting: *Deleted

## 2017-05-02 ENCOUNTER — Inpatient Hospital Stay (HOSPITAL_COMMUNITY): Payer: Medicaid Other | Admitting: Anesthesiology

## 2017-05-02 DIAGNOSIS — Z3A38 38 weeks gestation of pregnancy: Secondary | ICD-10-CM

## 2017-05-02 DIAGNOSIS — O134 Gestational [pregnancy-induced] hypertension without significant proteinuria, complicating childbirth: Secondary | ICD-10-CM

## 2017-05-02 DIAGNOSIS — O99824 Streptococcus B carrier state complicating childbirth: Secondary | ICD-10-CM

## 2017-05-02 LAB — CBC
HEMATOCRIT: 35.5 % — AB (ref 36.0–46.0)
HEMOGLOBIN: 12.1 g/dL (ref 12.0–15.0)
MCH: 28.1 pg (ref 26.0–34.0)
MCHC: 34.1 g/dL (ref 30.0–36.0)
MCV: 82.6 fL (ref 78.0–100.0)
Platelets: 198 10*3/uL (ref 150–400)
RBC: 4.3 MIL/uL (ref 3.87–5.11)
RDW: 14.5 % (ref 11.5–15.5)
WBC: 10.2 10*3/uL (ref 4.0–10.5)

## 2017-05-02 LAB — GLUCOSE, CAPILLARY
GLUCOSE-CAPILLARY: 72 mg/dL (ref 65–99)
GLUCOSE-CAPILLARY: 86 mg/dL (ref 65–99)
GLUCOSE-CAPILLARY: 69 mg/dL (ref 65–99)
Glucose-Capillary: 131 mg/dL — ABNORMAL HIGH (ref 65–99)

## 2017-05-02 LAB — ABO/RH: ABO/RH(D): A POS

## 2017-05-02 LAB — RPR: RPR Ser Ql: NONREACTIVE

## 2017-05-02 MED ORDER — SENNOSIDES-DOCUSATE SODIUM 8.6-50 MG PO TABS
2.0000 | ORAL_TABLET | ORAL | Status: DC
  Administered 2017-05-02 – 2017-05-03 (×2): 2 via ORAL
  Filled 2017-05-02 (×2): qty 2

## 2017-05-02 MED ORDER — PRENATAL MULTIVITAMIN CH
1.0000 | ORAL_TABLET | Freq: Every day | ORAL | Status: DC
  Administered 2017-05-03: 1 via ORAL
  Filled 2017-05-02: qty 1

## 2017-05-02 MED ORDER — BENZOCAINE-MENTHOL 20-0.5 % EX AERO
1.0000 "application " | INHALATION_SPRAY | CUTANEOUS | Status: DC | PRN
  Administered 2017-05-02: 1 via TOPICAL

## 2017-05-02 MED ORDER — DIPHENHYDRAMINE HCL 50 MG/ML IJ SOLN: 12.5000 mg | INTRAMUSCULAR | Status: DC | PRN

## 2017-05-02 MED ORDER — PHENYLEPHRINE 40 MCG/ML (10ML) SYRINGE FOR IV PUSH (FOR BLOOD PRESSURE SUPPORT)
80.0000 ug | PREFILLED_SYRINGE | INTRAVENOUS | Status: DC | PRN
  Filled 2017-05-02: qty 5

## 2017-05-02 MED ORDER — SIMETHICONE 80 MG PO CHEW: 80.0000 mg | CHEWABLE_TABLET | ORAL | Status: DC | PRN

## 2017-05-02 MED ORDER — WITCH HAZEL-GLYCERIN EX PADS: 1.0000 "application " | MEDICATED_PAD | CUTANEOUS | Status: DC | PRN

## 2017-05-02 MED ORDER — LACTATED RINGERS IV SOLN: 500.0000 mL | Freq: Once | INTRAVENOUS | Status: DC

## 2017-05-02 MED ORDER — COCONUT OIL OIL: 1.0000 "application " | TOPICAL_OIL | Status: DC | PRN

## 2017-05-02 MED ORDER — PHENYLEPHRINE 40 MCG/ML (10ML) SYRINGE FOR IV PUSH (FOR BLOOD PRESSURE SUPPORT)
80.0000 ug | PREFILLED_SYRINGE | INTRAVENOUS | Status: DC | PRN
  Filled 2017-05-02: qty 5
  Filled 2017-05-02: qty 10

## 2017-05-02 MED ORDER — EPHEDRINE 5 MG/ML INJ
10.0000 mg | INTRAVENOUS | Status: DC | PRN
  Filled 2017-05-02: qty 2

## 2017-05-02 MED ORDER — DIPHENHYDRAMINE HCL 25 MG PO CAPS: 25.0000 mg | ORAL_CAPSULE | Freq: Four times a day (QID) | ORAL | Status: DC | PRN

## 2017-05-02 MED ORDER — IBUPROFEN 600 MG PO TABS
600.0000 mg | ORAL_TABLET | Freq: Four times a day (QID) | ORAL | Status: DC
  Administered 2017-05-02 – 2017-05-04 (×7): 600 mg via ORAL
  Filled 2017-05-02 (×7): qty 1

## 2017-05-02 MED ORDER — DIBUCAINE 1 % RE OINT: 1.0000 "application " | TOPICAL_OINTMENT | RECTAL | Status: DC | PRN

## 2017-05-02 MED ORDER — TETANUS-DIPHTH-ACELL PERTUSSIS 5-2.5-18.5 LF-MCG/0.5 IM SUSP
0.5000 mL | Freq: Once | INTRAMUSCULAR | Status: AC
  Administered 2017-05-04: 0.5 mL via INTRAMUSCULAR
  Filled 2017-05-02: qty 0.5

## 2017-05-02 MED ORDER — ONDANSETRON HCL 4 MG/2ML IJ SOLN: 4.0000 mg | INTRAMUSCULAR | Status: DC | PRN

## 2017-05-02 MED ORDER — LIDOCAINE HCL (PF) 1 % IJ SOLN
INTRAMUSCULAR | Status: DC | PRN
  Administered 2017-05-02 (×2): 4 mL via EPIDURAL

## 2017-05-02 MED ORDER — ZOLPIDEM TARTRATE 5 MG PO TABS: 5.0000 mg | ORAL_TABLET | Freq: Every evening | ORAL | Status: DC | PRN

## 2017-05-02 MED ORDER — LACTATED RINGERS IV SOLN
500.0000 mL | Freq: Once | INTRAVENOUS | Status: AC
  Administered 2017-05-02: 250 mL via INTRAVENOUS

## 2017-05-02 MED ORDER — FENTANYL 2.5 MCG/ML BUPIVACAINE 1/10 % EPIDURAL INFUSION (WH - ANES)
14.0000 mL/h | INTRAMUSCULAR | Status: DC | PRN
  Administered 2017-05-02 (×2): 14 mL/h via EPIDURAL
  Filled 2017-05-02 (×2): qty 100

## 2017-05-02 NOTE — Anesthesia Pain Management Evaluation Note (Signed)
  CRNA Pain Management Visit Note  Patient: Barbara [name], 22 y.o., female  "Hello [facility] am a [facility]. We have an anesthesia team available at all times to provide care throughout the hospital, including epidural management and anesthesia for C-section. I don't know your plan for the delivery whether it a natural birth, water birth, IV sedation, nitrous supplementation, doula or epidural, but we want to meet your pain goals."   1.Was your pain managed to your expectations on prior hospitalizations?   Yes   2.What is your expectation for pain management during this hospitalization?     Epidural  3.How can we help you reach that goal? Maintain epidural.   Record the patient's initial score and the patient's pain goal.   Pain: 0  Pain Goal: 5 [facility] wants you to be able to say your pain was always managed very well.  Peggy Loge 05/02/2017

## 2017-05-02 NOTE — Progress Notes (Signed)
Vitals:   05/02/17 0540 05/02/17 0630  BP: 113/64 119/67  Pulse: (!) 117 (!) 102  Resp: 20 20  Temp:  97.9 F (36.6 C)  SpO2: 95%    Comfortable w/epidural.  FHR 135, Cat 1.  Ctx rare.  Pitocin at 8 mu/min.  cx 4-5/60/-1 per RN exam.  Will continue to increase pitocin until labor adequate/cx change.

## 2017-05-02 NOTE — Anesthesia Preprocedure Evaluation (Signed)
Anesthesia Evaluation  Patient identified by MRN, date of birth, ID band Patient awake    Reviewed: Allergy & Precautions  Airway Mallampati: III  TM Distance: >3 FB Neck ROM: Full    Dental no notable dental hx. (+) Teeth Intact   Pulmonary neg pulmonary ROS,    Pulmonary exam normal breath sounds clear to auscultation       Cardiovascular hypertension, Normal cardiovascular exam Rhythm:Regular Rate:Normal     Neuro/Psych PSYCHIATRIC DISORDERS Anxiety Depression Narcolepsy with cataplexy    GI/Hepatic Neg liver ROS, GERD  ,  Endo/Other  diabetes, Well Controlled, GestationalMorbid obesity  Renal/GU negative Renal ROS  negative genitourinary   Musculoskeletal negative musculoskeletal ROS (+)   Abdominal (+) + obese,   Peds  Hematology  (+) anemia ,   Anesthesia Other Findings   Reproductive/Obstetrics (+) Pregnancy                             Anesthesia Physical Anesthesia Plan  ASA: III  Anesthesia Plan: Epidural   Post-op Pain Management:    Induction:   PONV Risk Score and Plan:   Airway Management Planned: Natural Airway  Additional Equipment:   Intra-op Plan:   Post-operative Plan:   Informed Consent: I have reviewed the patients History and Physical, chart, labs and discussed the procedure including the risks, benefits and alternatives for the proposed anesthesia with the patient or authorized representative who has indicated his/her understanding and acceptance.     Plan Discussed with: Anesthesiologist  Anesthesia Plan Comments:         Anesthesia Quick Evaluation

## 2017-05-02 NOTE — Progress Notes (Signed)
Labor Progress Note Barbara [name] is a 22 y.o. G2P1001 at 4633w2d presented for IOL for gHTN  S:  Patient comfortable  O:  BP 111/60   Pulse 86   Temp 98.1 F (36.7 C)   Resp 18   Ht 5\' 5"  (1.651 m)   Wt 249 lb 4 oz (113.1 kg)   LMP 07/31/2016   SpO2 94%   BMI 41.48 kg/m   Fetal Tracing:  Baseline: 135 Variability: moderate Accels: 15x15 Decels: variable  Toco: 2-3  CVE: Dilation: 6 Effacement (%): 60 Cervical Position: Middle Station: -1 Presentation: Vertex Exam by:: Lawrence Roldan, cnm  A&P: 22 y.o. G2P1001 6733w2d IOL gHTN #Labor: Progressing well. Discussed with patient AROM and IUPC placement for labor augmentation. Patient agreeable to plan of care. AROM with moderate amount of light meconium stained fluid. IUPC placed without difficulty. Continue pitocin #Pain: epidural #FWB: Cat 1 #GBS positive  Rolm Bookbinderaroline M Briauna Gilmartin, CNM 1:20 PM ;a

## 2017-05-02 NOTE — Anesthesia Postprocedure Evaluation (Signed)
Anesthesia Post Note  Patient: Barbara [name]  Procedure(s) Performed: AN AD HOC LABOR EPIDURAL     Patient location during evaluation: Mother Baby Anesthesia Type: Epidural Level of consciousness: awake and alert and oriented Pain management: satisfactory to patient Vital Signs Assessment: post-procedure vital signs reviewed and stable Respiratory status: respiratory function stable Cardiovascular status: stable Postop Assessment: no headache, no backache, epidural receding, patient able to bend at knees, no signs of nausea or vomiting and adequate PO intake Anesthetic complications: no    Last Vitals:  Vitals:   05/02/17 1528 05/02/17 1650  BP: 123/66 124/69  Pulse: 89 96  Resp: 18 18  Temp:  36.8 C  SpO2:      Last Pain:  Vitals:   05/02/17 1650  TempSrc: Oral  PainSc:    Pain Goal:                 [name]

## 2017-05-02 NOTE — Progress Notes (Signed)
Labor Progress Note Barbara [name] is a 22 y.o. G2P1001 at 545w2d presented for IOL for gHTN  S:  Patient comfortable with epidural. Denies any headache, visual changes or epigastric pain.  O:  BP (!) 101/46   Pulse 89   Temp 97.9 F (36.6 C) (Oral)   Resp 18   Ht 5\' 5"  (1.651 m)   Wt 249 lb 4 oz (113.1 kg)   LMP 07/31/2016   SpO2 94%   BMI 41.48 kg/m   Fetal Tracing:  Baseline: 135 Variability: moderate Accels: 15x15 Decels: none  Toco: 2-3  CVE: Dilation: 4.5 Effacement (%): 60 Cervical Position: Middle Station: -1 Presentation: Vertex Exam by:: Laural RoesB. Parks, RN   A&P: 22 y.o. G2P1001 6445w2d IOL for gHTN #Labor: Progressing well. Continue pitocin.  #Pain: epidural #FWB: Cat 1 #GBS positive- PCN  Rolm Bookbinderaroline M Neill, CNM 10:05 AM

## 2017-05-02 NOTE — Progress Notes (Signed)
Foley fell out at 0215.  Cx 4/50/-2, UI only. Pitocin started.  FHR 130, cat 1.  139/83 142/80.  No HA, blurred vision, RUQ pain. CBG 131 (drinking carbs). PreE labs were normal.

## 2017-05-02 NOTE — Anesthesia Procedure Notes (Signed)
Epidural Patient location during procedure: OB Start time: 05/02/2017 5:20 AM  Staffing Anesthesiologist: Mal AmabileFoster, Layann Bluett, MD Performed: anesthesiologist   Preanesthetic Checklist Completed: patient identified, site marked, surgical consent, pre-op evaluation, timeout performed, IV checked, risks and benefits discussed and monitors and equipment checked  Epidural Patient position: sitting Prep: site prepped and draped and DuraPrep Patient monitoring: continuous pulse ox and blood pressure Approach: midline Location: L3-L4 Injection technique: LOR air  Needle:  Needle type: Tuohy  Needle gauge: 17 G Needle length: 9 cm and 9 Needle insertion depth: 6 cm Catheter type: closed end flexible Catheter size: 19 Gauge Catheter at skin depth: 11 cm Test dose: negative and Other  Assessment Events: blood not aspirated, injection not painful, no injection resistance, negative IV test and no paresthesia  Additional Notes Patient identified. Risks and benefits discussed including failed block, incomplete  Pain control, post dural puncture headache, nerve damage, paralysis, blood pressure Changes, nausea, vomiting, reactions to medications-both toxic and allergic and post Partum back pain. All questions were answered. Patient expressed understanding and wished to proceed. Sterile technique was used throughout procedure. Epidural site was Dressed with sterile barrier dressing. No paresthesias, signs of intravascular injection Or signs of intrathecal spread were encountered.  Patient was more comfortable after the epidural was dosed. Please see RN's note for documentation of vital signs and FHR which are stable.

## 2017-05-03 LAB — CBC
HEMATOCRIT: 32.6 % — AB (ref 36.0–46.0)
HEMOGLOBIN: 11 g/dL — AB (ref 12.0–15.0)
MCH: 27.8 pg (ref 26.0–34.0)
MCHC: 33.7 g/dL (ref 30.0–36.0)
MCV: 82.3 fL (ref 78.0–100.0)
Platelets: 171 10*3/uL (ref 150–400)
RBC: 3.96 MIL/uL (ref 3.87–5.11)
RDW: 14.2 % (ref 11.5–15.5)
WBC: 9.3 10*3/uL (ref 4.0–10.5)

## 2017-05-03 MED ORDER — ACETAMINOPHEN 325 MG PO TABS
650.0000 mg | ORAL_TABLET | ORAL | Status: DC | PRN
Start: 2017-05-03 — End: 2017-05-04
  Administered 2017-05-03: 650 mg via ORAL
  Filled 2017-05-03: qty 2

## 2017-05-03 MED ORDER — ESCITALOPRAM OXALATE 10 MG PO TABS: 10.0000 mg | ORAL_TABLET | Freq: Every day | ORAL | 2 refills | Status: DC

## 2017-05-03 MED ORDER — ESCITALOPRAM OXALATE 10 MG PO TABS
10.0000 mg | ORAL_TABLET | Freq: Every day | ORAL | Status: DC
  Administered 2017-05-03 – 2017-05-04 (×2): 10 mg via ORAL
  Filled 2017-05-03 (×3): qty 1

## 2017-05-03 NOTE — Progress Notes (Signed)
CSW acknowledges consult.  CSW attempted to meet with MOB, however MOB had several room guest.  CSW will attempt to visit with MOB at a later time.   Eloy Fehl Boyd-Gilyard, MSW, LCSW Clinical Social Work [phone]  

## 2017-05-03 NOTE — Progress Notes (Signed)
Mother of baby was referred for history of depression and anxiety. CSW met with patient and infant at bedside to discuss mental health history. Patient stated that she was diagnosed four years ago after the birth of her oldest son, Hunter. Patient stated that she has not recently or currently experienced any signs of symptoms of anxiety or depression. Patient reports having abundant support from family members at home. Patient states that she has access to the mental health provider that she saw before if any needs arise after discharge. Patient is not currently on any medications but took Zoloft in the past. Patient was educated on safe sleep and SIDS prevention.  Please contact the CSW if needs arise, if mother of baby requests, or if mother of baby scores greater than 9 or answers yes to question 10 on [place] Postpartum Depression Screen.  Rebeca Valdivia, MSW, LCSW-A Clinical Social Worker [facility] [phone]     

## 2017-05-03 NOTE — Plan of Care (Signed)
Mother progressing appropriately.

## 2017-05-03 NOTE — Lactation Note (Signed)
This note was copied from a baby's chart. Lactation Consultation Note  Patient Name: Barbara [name] ZOXWR'UToday's Date: 05/03/2017 Reason for consult: Initial assessment;1st time breastfeeding;Early term 37-38.6wks  GHTN, and GDM.  Visited with P2 Mom of ET baby at 7527 hrs old.   Baby has been to breast >8 times first 24 hrs.  Latch scores of 8-9.  Mom started feeding baby formula as she is concerned that baby lost a few ounces.  Baby lost to 5% weight loss in one day.  Offered to assist and assess a feeding.  Mom stated she had just decided to feed baby formula.    Offered to set up DEBP and assist her to double pump to support her milk supply.  Mom using manual pump.  Talked about benefits of double pumping using a hospital grade pump.  Lactation brochure left with Mom.  Encouraged Mom to call prn for assistance.  Recommended Mom breastfeed before offering supplement.  Interventions Interventions: Breast feeding basics reviewed;Skin to skin;Hand pump;Breast massage;Hand express;Expressed milk  Lactation Tools Discussed/Used WIC Program: Yes Pump Review: Setup, frequency, and cleaning Initiated by:: RN Date initiated:: 05/03/17   Consult Status Consult Status: Complete Date: 05/03/17 Follow-up type: Call as needed    Judee ClaraSmith, Keyante Durio E 05/03/2017, 5:22 PM

## 2017-05-03 NOTE — Discharge Summary (Signed)
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 yo G2P2002 at 356w2das admitted to the hospital 05/01/2017 for induction of labor.  Indication for induction: Gestational hypertension and A1 DM.  Patient had an uncomplicated labor course as follows: Membrane Rupture Time/Date: 12:26 PM ,05/02/2017   Intrapartum Procedures: Episiotomy: None [1]                                         Lacerations:  None [1]  Patient had delivery of a Viable infant.  Information for the patient's newborn:  Barbara RyanDelivery Method: Vaginal, Spontaneous(Filed from Delivery Summary)   05/02/2017  Details of delivery can be found in separate delivery note.  Patient had a routine postpartum course. Patient is discharged home 05/03/17.  Physical exam  Vitals:   05/02/17 1528 05/02/17 1650 05/02/17 2042 05/03/17 0516  BP: 123/66 124/69 119/73 122/84  Pulse: 89 96 83 87  Resp: _0 Temp:  98.2 F (36.8 C) 98 F (36.7 C) 98 F (36.7 C)  TempSrc:  Oral Oral Oral  SpO2:      Weight:      Height:       General: alert, cooperative and no distress Lochia: appropriate Uterine Fundus: firm Incision: N/A DVT Evaluation: No evidence of DVT seen on physical exam. Labs: Lab  Results  Component Value Date   WBC 9.3 05/03/2017   HGB 11.0 (L) 05/03/2017   HCT 32.6 (L) 05/03/2017   MCV 82.3 05/03/2017   PLT 171 05/03/2017   CMP Latest Ref Rng & Units 05/01/2017  Glucose 65 - 99 mg/dL 118(H)  BUN 6 - 20 mg/dL 10  Creatinine 0.44 - 1.00 mg/dL 0.57  Sodium 135 - 145 mmol/L 133(L)  Potassium 3.5 - 5.1 mmol/L 3.9  Chloride 101 - 111 mmol/L 104  CO2 22 - 32 mmol/L 19(L)  Calcium 8.9 - 10.3 mg/dL 8.9  Total Protein 6.5 - 8.1 g/dL 6.4(L)  Total Bilirubin 0.3 - 1.2 mg/dL 0.3  Alkaline Phos 38 - 126 U/L 119  AST 15 - 41 U/L 39  ALT 14 - 54 U/L 28    Discharge instruction: per After Visit Summary and "Baby and Me Booklet".  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   Other Itching, Nausea And Vomiting   SHERBET.   Percocet [oxycodone-acetaminophen] Other (See Comments)   States feels like chest is tightening up.      Medication List    STOP taking these medications   ACCU-CHEK FASTCLIX LANCETS Misc   ACCU-CHEK GUIDE w/Device Kit   glucose blood test strip Commonly known as:  ACCU-CHEK GUIDE     TAKE these medications   acetaminophen 500 MG tablet Commonly known as:  TYLENOL Take 500 mg by mouth every 6 (six) hours as needed for moderate pain or headache.   escitalopram 10 MG tablet Commonly known as:  LEXAPRO Take 1 tablet (10 mg total) by mouth daily.   PRENATE PIXIE 10-0.6-0.4-200 MG Caps Take 1 capsule by mouth daily.       Diet: routine diet  Activity: Advance as tolerated. Pelvic rest for 6 weeks.   Outpatient follow up:2 weeks  Patient with hx of PPD, and requesting to restart the medication she was on for this in the past. On chart review she was on Lexapro. Will give dose here today, and RX sent to pharmacy on file. SW consult order placed. Patient to see SW prior to DC. FU in 2 weeks for med check.   Follow up Appt: Future Appointments  Date Time Provider [place]  06/11/2017 11:30 AM Ward Givens, NP [facility] None   Follow up Visit:No follow-ups on file.  Postpartum contraception: Undecided  Newborn Data: Live born female  Birth Weight: 8 lb 12.2 oz (3975 g) APGAR: 8, 9  Newborn Delivery   Birth date/time:  05/02/2017 13:50:00 Delivery type:  Vaginal, Spontaneous     Baby Feeding: Breast Disposition:home with mother   05/03/2017 Barbara [name], CNM

## 2017-05-04 MED ORDER — IBUPROFEN 600 MG PO TABS: 600.0000 mg | ORAL_TABLET | Freq: Four times a day (QID) | ORAL | 0 refills | Status: DC

## 2017-05-04 NOTE — Discharge Summary (Signed)
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 yo G2P2002 at 388w2das admitted to the hospital 05/01/2017 for induction of labor.  Indication for induction: Gestational hypertension and A1 DM.  Patient had an uncomplicated labor course as follows: Membrane Rupture Time/Date: 12:26 PM ,05/02/2017   Intrapartum Procedures: Episiotomy: None [1]                                         Lacerations:  None [1]  Patient had delivery of a Viable infant.  Information for the patient's newborn:  HuKatelin, Kutsch0[[id number]]Delivery Method: Vaginal, Spontaneous(Filed from Delivery Summary)   05/02/2017  Details of delivery can be found in separate delivery note.  Patient had a routine postpartum course. Patient is discharged home 05/04/17.  Physical exam  Vitals:   05/02/17 2042 05/03/17 0516 05/03/17 1821 05/04/17 0628  BP: 119/73 122/84 129/79 122/76  Pulse: 83 87 86 88  Resp: '18 18 18 16  '$ Temp: 98 F (36.7 C) 98 F (36.7 C) (!) 97.4 F (36.3 C) 98 F (36.7 C)  TempSrc: Oral Oral Oral Oral  SpO2:   100%   Weight:      Height:       General: alert, cooperative and no distress Lochia: appropriate Uterine Fundus: firm Incision: N/A DVT Evaluation: No evidence of DVT seen on  physical exam. Labs: Lab Results  Component Value Date   WBC 9.3 05/03/2017   HGB 11.0 (L) 05/03/2017   HCT 32.6 (L) 05/03/2017   MCV 82.3 05/03/2017   PLT 171 05/03/2017   CMP Latest Ref Rng & Units 05/01/2017  Glucose 65 - 99 mg/dL 118(H)  BUN 6 - 20 mg/dL 10  Creatinine 0.44 - 1.00 mg/dL 0.57  Sodium 135 - 145 mmol/L 133(L)  Potassium 3.5 - 5.1 mmol/L 3.9  Chloride 101 - 111 mmol/L 104  CO2 22 - 32 mmol/L 19(L)  Calcium 8.9 - 10.3 mg/dL 8.9  Total Protein 6.5 - 8.1 g/dL 6.4(L)  Total Bilirubin 0.3 - 1.2 mg/dL 0.3  Alkaline Phos 38 - 126 U/L 119  AST 15 - 41 U/L 39  ALT 14 - 54 U/L 28    Discharge instruction: per After Visit Summary and "Baby and Me Booklet".  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   Other Itching, Nausea And Vomiting   SHERBET.   Percocet [oxycodone-acetaminophen] Other (See Comments)   States feels like chest is tightening up.      Medication List    STOP taking these medications   ACCU-CHEK FASTCLIX LANCETS Misc   ACCU-CHEK GUIDE w/Device Kit   glucose blood test strip Commonly known as:  ACCU-CHEK GUIDE     TAKE these medications   acetaminophen 500 MG tablet Commonly known as:  TYLENOL Take 500 mg by mouth every 6 (six) hours as needed for moderate pain or headache.   escitalopram 10 MG tablet Commonly known as:  LEXAPRO Take 1 tablet (10 mg total) by mouth daily.   ibuprofen 600 MG tablet Commonly known as:  ADVIL,MOTRIN Take 1 tablet (600 mg total) by mouth every 6 (six) hours.   PRENATE PIXIE 10-0.6-0.4-200 MG Caps Take 1 capsule by mouth daily.       Diet: routine diet  Activity: Advance as tolerated. Pelvic rest for 6 weeks.   Outpatient follow up:2 weeks  Patient with hx of PPD, and requesting to restart the medication she was on for this in the past. On chart review she was on Lexapro.  Will give dose here today, and RX sent to pharmacy on file. SW consult order placed. Patient to see SW prior to DC. FU in 2 weeks for med check.  Follow up Appt: Future Appointments  Date Time Provider [place]  06/11/2017 11:30 AM Ward Givens, NP [facility] None   Follow up Visit:No follow-ups on file.  Postpartum contraception: Undecided  Newborn Data: Live born female  Birth Weight: 8 lb 12.2 oz (3975 g) APGAR: 19, 9  Newborn Delivery   Birth date/time:  05/02/2017 13:50:00 Delivery type:  Vaginal, Spontaneous     Baby Feeding: Breast Disposition:home with mother   05/04/2017 Koren Shiver, CNM

## 2017-05-05 ENCOUNTER — Encounter (INDEPENDENT_AMBULATORY_CARE_PROVIDER_SITE_OTHER): Payer: Medicaid Other | Admitting: *Deleted

## 2017-05-05 ENCOUNTER — Telehealth: Payer: Self-pay | Admitting: Radiology

## 2017-05-05 NOTE — Telephone Encounter (Signed)
Called to inform patient of appointment on 05/23/17 ! 10:00, no answer, unable to leave voicemail.

## 2017-05-07 ENCOUNTER — Encounter: Payer: Self-pay | Admitting: Family Medicine

## 2017-05-23 ENCOUNTER — Ambulatory Visit: Payer: Self-pay | Admitting: Obstetrics and Gynecology

## 2017-05-23 ENCOUNTER — Encounter: Payer: Self-pay | Admitting: Obstetrics and Gynecology

## 2017-05-23 NOTE — Progress Notes (Signed)
Patient did not keep postpartum appointment for 05/23/2017.  Cornelia Copaharlie Kida Digiulio, Jr MD Attending [facility](Faculty Practice)

## 2017-05-26 ENCOUNTER — Encounter: Payer: Self-pay | Admitting: Adult Health

## 2017-05-26 ENCOUNTER — Ambulatory Visit: Payer: Self-pay | Admitting: Adult Health

## 2017-05-26 ENCOUNTER — Telehealth: Payer: Self-pay | Admitting: *Deleted

## 2017-05-26 NOTE — Telephone Encounter (Signed)
Patient was no show for follow up with NP today.  

## 2017-05-27 ENCOUNTER — Encounter: Payer: Self-pay | Admitting: Family Medicine

## 2017-06-11 ENCOUNTER — Ambulatory Visit: Payer: Self-pay | Admitting: Adult Health

## 2017-06-18 ENCOUNTER — Ambulatory Visit: Payer: Medicaid Other | Admitting: Adult Health

## 2017-06-18 ENCOUNTER — Telehealth: Payer: Self-pay | Admitting: Adult Health

## 2017-06-18 ENCOUNTER — Telehealth: Payer: Self-pay | Admitting: *Deleted

## 2017-06-18 NOTE — Telephone Encounter (Signed)
Patient has had 3 no-shows. 05/26/17, 06/18/17, 05/09/16 Patient left 10/16/16  Per policy she will be dismissed from our office

## 2017-06-18 NOTE — Telephone Encounter (Signed)
Patient was no show for follow up with NP today.  

## 2017-06-19 ENCOUNTER — Encounter: Payer: Self-pay | Admitting: Adult Health

## 2017-06-24 ENCOUNTER — Encounter: Payer: Self-pay | Admitting: Neurology

## 2017-06-25 ENCOUNTER — Encounter: Payer: Self-pay | Admitting: Radiology

## 2017-06-25 ENCOUNTER — Encounter: Payer: Self-pay | Admitting: Family Medicine

## 2017-06-25 ENCOUNTER — Ambulatory Visit (INDEPENDENT_AMBULATORY_CARE_PROVIDER_SITE_OTHER): Payer: Medicaid Other | Admitting: Family Medicine

## 2017-06-25 ENCOUNTER — Other Ambulatory Visit (HOSPITAL_COMMUNITY)
Admission: RE | Admit: 2017-06-25 | Discharge: 2017-06-25 | Disposition: A | Discharge status: Home / Self Care | Payer: Medicaid Other | Source: Ambulatory Visit | Attending: Family Medicine | Admitting: Family Medicine

## 2017-06-25 DIAGNOSIS — Z3042 Encounter for surveillance of injectable contraceptive: Secondary | ICD-10-CM | POA: Diagnosis not present

## 2017-06-25 DIAGNOSIS — Z1389 Encounter for screening for other disorder: Secondary | ICD-10-CM | POA: Diagnosis not present

## 2017-06-25 MED ORDER — MEDROXYPROGESTERONE ACETATE 150 MG/ML IM SUSP
150.0000 mg | INTRAMUSCULAR | Status: AC
Start: 2017-06-25 — End: 2018-06-20
  Administered 2017-06-25: 150 mg via INTRAMUSCULAR

## 2017-06-25 NOTE — Progress Notes (Signed)
Post Partum Exam  Barbara [name] is a 22 y.o. G41P2002 female who presents for a postpartum visit. She is 8 weeks postpartum following a spontaneous vaginal delivery. I have fully reviewed the prenatal and intrapartum course. The delivery was at 38 gestational weeks.  Anesthesia: epidural. Postpartum course has been uncomplicated. Baby's course has been uncomplicated. Baby is feeding by both breast and bottle - Gerber Smooth. Bleeding clots. Bowel function is normal. Bladder function is normal. Patient is sexually active. Contraception method is Depo-Provera injections. Postpartum depression screening:neg  The following portions of the patient's history were reviewed and updated as appropriate: allergies, current medications, past family history, past medical history, past social history, past surgical history and problem list.  Last pap smear done 2017 and was Normal-- no record on file  Review of Systems Pertinent items are noted in HPI.    Objective:  Blood pressure 123/81, pulse 93, weight 227 lb (103 kg), currently breastfeeding.  General:  alert, cooperative and appears stated age   Breasts:  inspection negative, no nipple discharge or bleeding, no masses or nodularity palpable  Lungs: normal WOB  Heart:  RR  Abdomen: soft, non-tender; bowel sounds normal; no masses,  no organomegaly   Vulva:  normal  Vagina: normal vagina  Cervix:  multiparous appearance  Corpus: normal  Adnexa:  normal adnexa  Rectal Exam: Not performed.        Assessment:    Normal postpartum exam. Pap smear done at today's visit.   Plan:   1. Contraception: Depo-Provera injections- start today 2. Pap- collected today 3. Mood- wnl today. Reviewed risk of PPD/PPA. Patient reports history of depression after last child. Discussed [facility] as a resource if mood changes.  4.  GDM in pregnancy-- schedule for 2hr GTT  Follow up in: 1 year or as needed.

## 2017-06-30 LAB — CYTOLOGY - PAP: Diagnosis: NEGATIVE

## 2017-07-03 ENCOUNTER — Encounter: Payer: Self-pay | Admitting: Radiology

## 2017-07-20 ENCOUNTER — Encounter (HOSPITAL_COMMUNITY): Payer: Self-pay

## 2017-07-20 ENCOUNTER — Emergency Department (HOSPITAL_COMMUNITY)
Admission: EM | Admit: 2017-07-20 | Discharge: 2017-07-20 | Disposition: A | Discharge status: Home / Self Care | Payer: Medicaid Other | Attending: Emergency Medicine | Admitting: Emergency Medicine

## 2017-07-20 DIAGNOSIS — Z79899 Other long term (current) drug therapy: Secondary | ICD-10-CM | POA: Insufficient documentation

## 2017-07-20 DIAGNOSIS — K0889 Other specified disorders of teeth and supporting structures: Secondary | ICD-10-CM | POA: Insufficient documentation

## 2017-07-20 MED ORDER — PENICILLIN V POTASSIUM 250 MG PO TABS: 250.0000 mg | ORAL_TABLET | Freq: Four times a day (QID) | ORAL | 0 refills | Status: AC

## 2017-07-20 MED ORDER — IBUPROFEN 800 MG PO TABS: 800.0000 mg | ORAL_TABLET | Freq: Three times a day (TID) | ORAL | 0 refills | Status: DC

## 2017-07-20 NOTE — ED Triage Notes (Signed)
Patient complains of right lower dental pain and reports broken tooth, pain x 2 days

## 2017-07-20 NOTE — ED Provider Notes (Signed)
[facility] EMERGENCY DEPARTMENT Provider Note   CSN: [id number] Arrival date & time: 07/20/17  1144     History   Chief Complaint No chief complaint on file.   HPI Barbara [name] is a 22 y.o. female.  Pt comes in with c/o right lower dental pain. Denies fever. States that she has decay in a tooth but has not been able to see a dentist. No problem swallowing or breathing     Past Medical History:  Diagnosis Date  . Anxiety   . Back pain   . Depression    postpartum depression  . Gestational diabetes    diet controlled  . Hemiparesis (HCC) 08/31/2014   Occasional, evaluated by Neurology  . 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 UTI (urinary tract infection)     Patient Active Problem List   Diagnosis Date Noted  . Gestational hypertension 05/01/2017  . GDM (gestational diabetes mellitus) 03/14/2017  . Family history of cleft palate 02/05/2017  . Narcolepsy 09/19/2016  . Depressed mood with postpartum onset 01/18/2014    Past Surgical History:  Procedure Laterality Date  . NO PAST SURGERIES       OB History    Gravida  2   Para  2   Term  2   Preterm      AB      Living  2     SAB      TAB      Ectopic      Multiple  0   Live Births  2            Home Medications    Prior to Admission medications   Medication Sig Start Date End Date Taking? Authorizing Provider  acetaminophen (TYLENOL) 500 MG tablet Take 500 mg by mouth every 6 (six) hours as needed for moderate pain or headache.     [provider]  escitalopram (LEXAPRO) 10 MG tablet Take 1 tablet (10 mg total) by mouth daily. 05/03/17 08/01/17  Thressa Sheller D, CNM  ibuprofen (ADVIL,MOTRIN) 600 MG tablet Take 1 tablet (600 mg total) by mouth every 6 (six) hours. 05/04/17   Montez Morita, CNM  Prenat-FeAsp-Meth-FA-DHA w/o A (PRENATE PIXIE) 10-0.6-0.4-200 MG CAPS Take 1 capsule by mouth daily. 10/31/16    Tereso Newcomer, MD    Family History Family History  Problem Relation Age of Onset  . Depression Mother   . Healthy Brother   . Healthy Sister   . Hypertension Maternal Grandmother   . Depression Maternal Grandmother   . Asthma Maternal Grandmother   . Diabetes Maternal Grandmother   . Heart disease Maternal Grandmother   . Hearing loss Maternal Grandfather   . COPD Maternal Grandfather     Social History Social History   Tobacco Use  . Smoking status: Never Smoker  . Smokeless tobacco: Never Used  Substance Use Topics  . Alcohol use: No    Alcohol/week: 0.0 oz  . Drug use: No     Allergies   Corn-containing products; Other; and Percocet [oxycodone-acetaminophen]   Review of Systems Review of Systems  All other systems reviewed and are negative.    Physical Exam Updated Vital Signs BP (!) 147/81   Pulse 63   Temp 98.3 F (36.8 C) (Oral)   Resp 18   SpO2 99%   Physical Exam  Constitutional: She is oriented to person, place, and time. She appears  well-developed and well-nourished.  HENT:  Mild swelling noted to the right lower jaw. No problem swallowing or breathing. Decay noted to the right posterior teeth  Cardiovascular: Normal rate.  Pulmonary/Chest: Effort normal and breath sounds normal.  Musculoskeletal: Normal range of motion.  Neurological: She is alert and oriented to person, place, and time.  Skin: Skin is warm and dry.  Nursing note and vitals reviewed.    ED Treatments / Results  Labs (all labs ordered are listed, but only abnormal results are displayed) Labs Reviewed - No data to display  EKG None  Radiology No results found.  Procedures Procedures (including critical care time)  Medications Ordered in ED Medications - No data to display   Initial Impression / Assessment and Plan / ED Course  I have reviewed the triage vital signs and the nursing notes.  Pertinent labs & imaging results that were available during my  care of the patient were reviewed by me and considered in my medical decision making (see chart for details).    No sign of ludwigs. Will treat with pcn  Final Clinical Impressions(s) / ED Diagnoses   Final diagnoses:  None    ED Discharge Orders    None       Teressa Lowerickering, Fredderick Swanger, NP 07/20/17 1207    Doug SouJacubowitz, Sam, MD 07/20/17 [phone]

## 2017-09-10 ENCOUNTER — Ambulatory Visit: Payer: Self-pay

## 2018-04-21 ENCOUNTER — Encounter: Payer: Self-pay | Admitting: Radiology

## 2018-08-09 IMAGING — US US MFM FETAL NUCHAL TRANSLUCENCY
1 series · 14 of 28 positions shown · non-contrast
Comparison: none

TRANSLUCENCY

1  RICERDAS MOLOTOKIENE           [id number]      [id number]     [id number]
Indications
13 weeks gestation of pregnancy
Encounter for nuchal translucency
Obesity complicating pregnancy, first
trimester
OB History
Gravidity:    2         Term:   1        Prem:   0        SAB:   0
TOP:          0       Ectopic:  0        Living: 1
Fetal Evaluation
Num Of Fetuses:     1
Preg. Location:     Intrauterine
Gest. Sac:          Intrauterine
Fetal Pole:         Visualized
Fetal Heart         153
Rate(bpm):
Cardiac Activity:   Observed
Biometry
CRL:      83.1  mm     G. Age:  13w 6d                  EDD:   05/09/17
Gestational Age
Best:          13w 1d    Det. By:   Early Ultrasound         EDD:   05/14/17
(09/19/16)
1st Trimester Genetic Sonogram Screening
CRL:            83.1  mm    G. Age:   13w 6d                 EDD:   05/09/17
Nuc Trans:       2.2  mm
Nasal Bone:                 Present
Cervix Uterus Adnexa
Cervix
Closed
Uterus
No abnormality visualized.
Left Ovary
Within normal limits.
Right Ovary
Cul De Sac:   No free fluid seen.
Adnexa:       No abnormality visualized.
Impression
INDICATION: 21 yr old [id number] at 18w1d for first trimester
screen.

[Series 1: us mfm fetal nuchal translucency · 49 acquisitions, 14 frames shown]
[im 2/49]
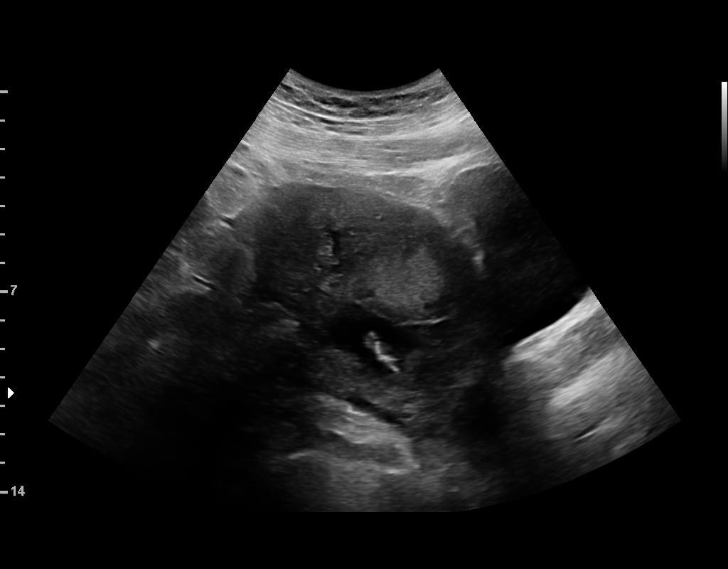
[im 6/49]
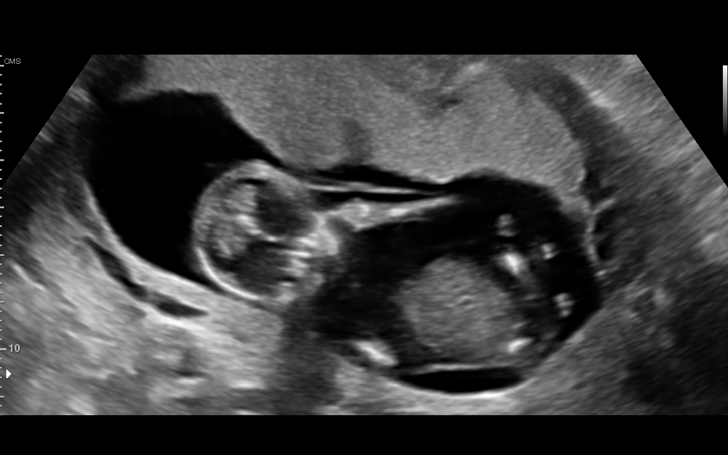
[im 9/49]
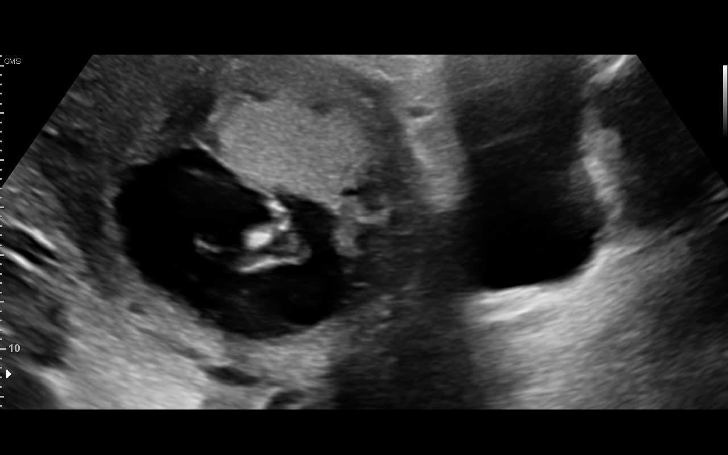
[im 13/49]
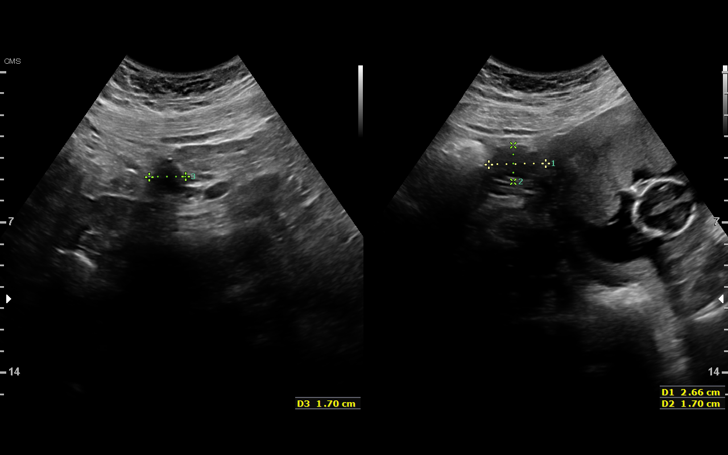
[im 17/49]
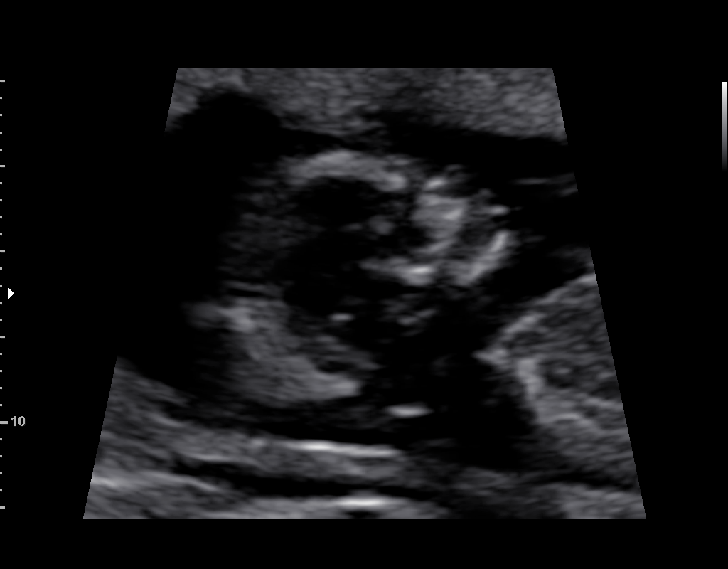
[im 20/49]
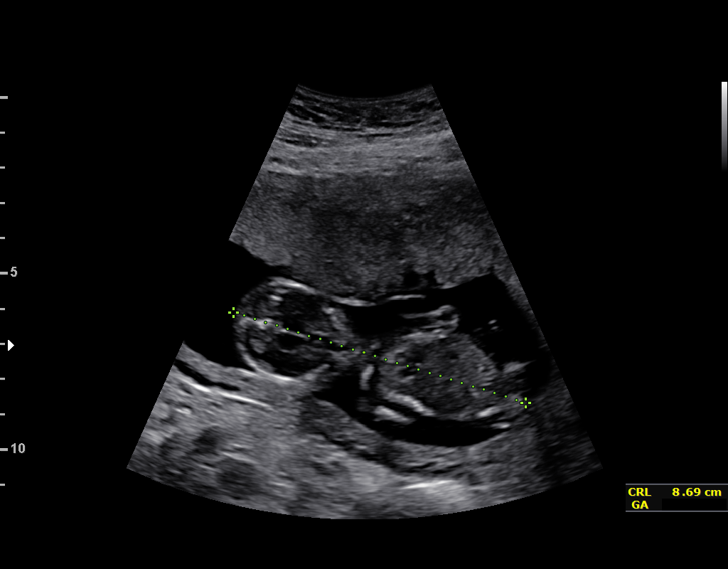
[im 24/49]
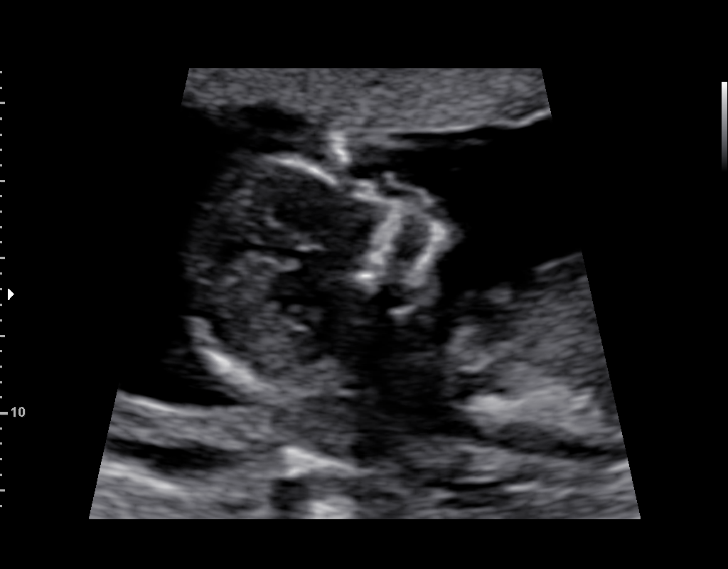
[im 27/49]
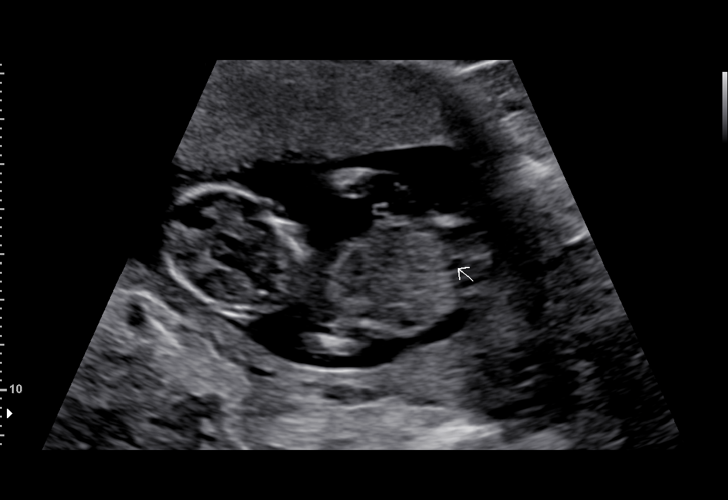
[im 31/49]
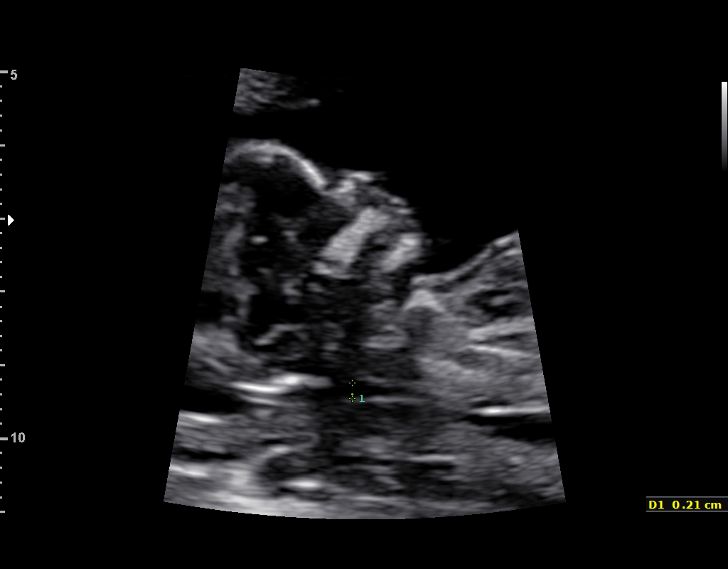
[im 34/49]
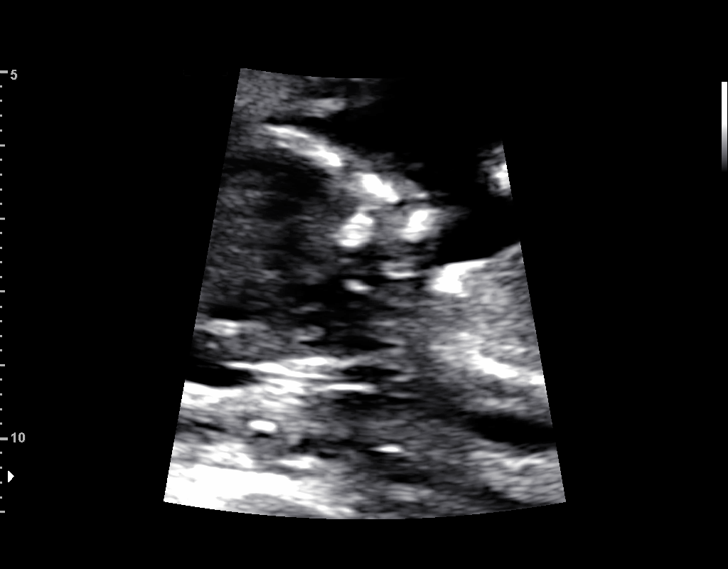
[im 38/49]
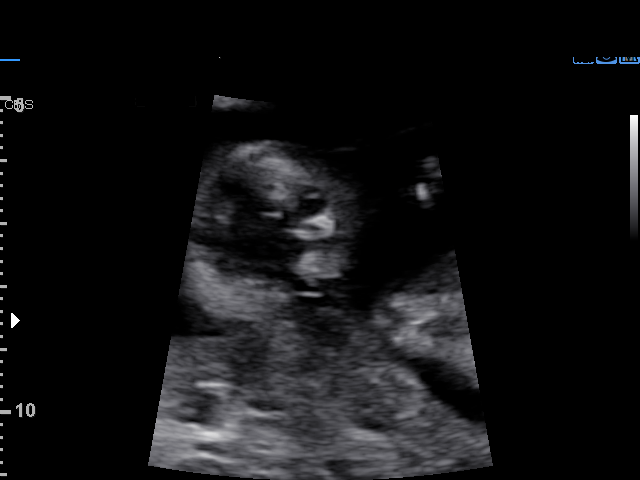
[im 41/49]
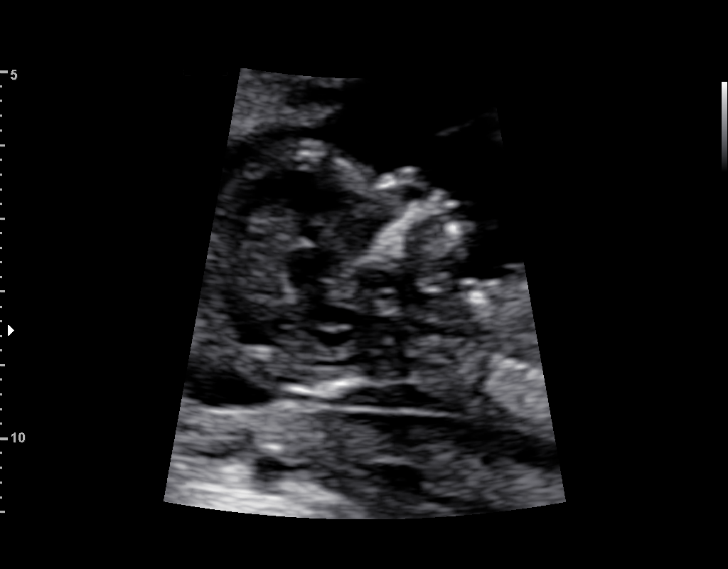
[im 45/49]
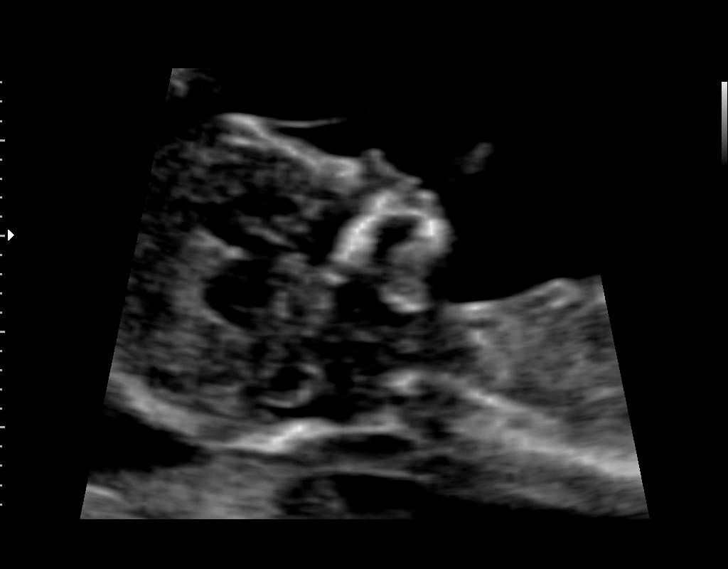
[im 49/49]
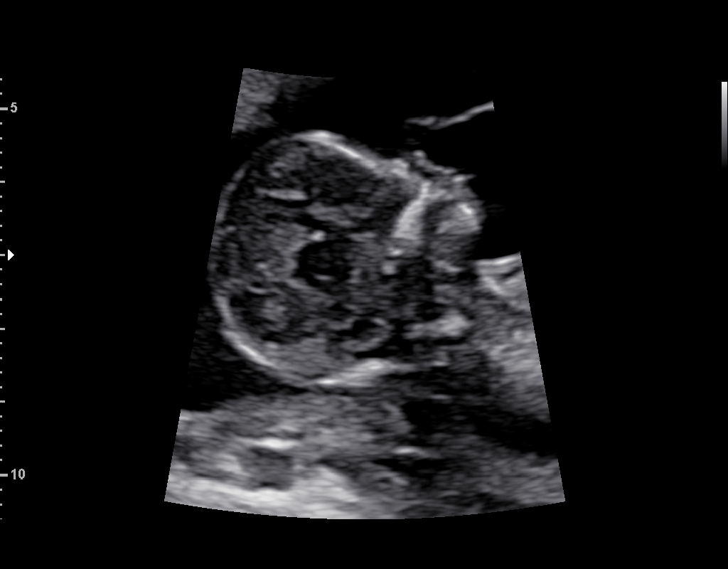

[14 of 28 positions shown; findings below may reference images not displayed]

FINDINGS: 1. Single intrauterine pregnancy.
2. Fetal crown rump length is consistent with dating.
3. Normal uterus; no adnexal masses are seen.
4. Evaluation of fetal anatomy is limited by early gestational
age.
5. Normal nuchal translucency measuring 2.2mm.
6. The nasal bone is visualized.
Recommendations

1. Appropriate fetal growth.
2. First trimester screen done today.
3. Recommend offer maternal serum AFP at 15-20 weeks.
4. Recommend fetal anatomic survey at 18-20 weeks.

## 2019-03-11 ENCOUNTER — Other Ambulatory Visit: Payer: Self-pay

## 2019-03-11 ENCOUNTER — Emergency Department (HOSPITAL_COMMUNITY)
Admission: EM | Admit: 2019-03-11 | Discharge: 2019-03-11 | Disposition: A | Discharge status: Home / Self Care | Payer: BLUE CROSS/BLUE SHIELD | Attending: Emergency Medicine | Admitting: Emergency Medicine

## 2019-03-11 ENCOUNTER — Encounter (HOSPITAL_COMMUNITY): Payer: Self-pay

## 2019-03-11 DIAGNOSIS — H66002 Acute suppurative otitis media without spontaneous rupture of ear drum, left ear: Secondary | ICD-10-CM | POA: Insufficient documentation

## 2019-03-11 DIAGNOSIS — Z79899 Other long term (current) drug therapy: Secondary | ICD-10-CM | POA: Insufficient documentation

## 2019-03-11 DIAGNOSIS — H9202 Otalgia, left ear: Secondary | ICD-10-CM | POA: Diagnosis present

## 2019-03-11 MED ORDER — AMOXICILLIN-POT CLAVULANATE 875-125 MG PO TABS
1.0000 | ORAL_TABLET | Freq: Once | ORAL | Status: AC
  Administered 2019-03-11: 08:00:00 1 via ORAL
  Filled 2019-03-11: qty 1

## 2019-03-11 MED ORDER — AMOXICILLIN-POT CLAVULANATE 875-125 MG PO TABS: 1.0000 | ORAL_TABLET | Freq: Two times a day (BID) | ORAL | 0 refills | Status: DC

## 2019-03-11 MED ORDER — IBUPROFEN 600 MG PO TABS: 600.0000 mg | ORAL_TABLET | Freq: Four times a day (QID) | ORAL | 0 refills | Status: DC | PRN

## 2019-03-11 NOTE — ED Triage Notes (Addendum)
Pt BIB from home by EMS for Left earache and hearing loss since Wed morning at 11a. Denies fever. Pt sts she cleaned her ear x2, and thinks she may have left a piece of the q-tip in her ear. Pt crying at the bedside.  bp- 138 palp Hr- 88 O2- 97% T- 97.5

## 2019-03-11 NOTE — ED Notes (Signed)
PT DISCHARGED. INSTRUCTIONS AND PRESCRIPTIONS GIVEN. AAOX4. PT IN NO APPARENT DISTRESS WITH SEVERE PAIN. THE OPPORTUNITY TO ASK QUESTIONS WAS PROVIDED. 

## 2019-03-11 NOTE — ED Provider Notes (Signed)
[facility] DEPT Provider Note   CSN: [id number] Arrival date & time: 03/11/19  0707     History No chief complaint on file.   Barbara [name] is a 24 y.o. female.  The history is provided by the patient. No language interpreter was used.     24 year old female brought here via EMS with complaints of foreign body in ear.  Patient report yesterday she was cleaning her ear with a Q-tip.  She then noticed some discomfort in her left ear.  Since then, she has noticed increasing discomfort in the ear which she felt may be a some retained foreign body.  She endorsed no movement in her ear per se but she worried she may have a retained Q-tip head.  She endorsed some mildly decreased hearing in the same ear.  She does not complain of any pain when laying on the affected side.  No associated fever runny nose sneezing coughing jaw pain or neck pain.  No specific treatment tried.  Past Medical History:  Diagnosis Date  . Anxiety   . Back pain   . Depression    postpartum depression  . Gestational diabetes    diet controlled  . Hemiparesis ([place]) 08/31/2014   Occasional, evaluated by Neurology  . 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 UTI (urinary tract infection)     Patient Active Problem List   Diagnosis Date Noted  . Gestational hypertension 05/01/2017  . GDM (gestational diabetes mellitus) 03/14/2017  . Family history of cleft palate 02/05/2017  . Narcolepsy 09/19/2016  . Depressed mood with postpartum onset 01/18/2014    Past Surgical History:  Procedure Laterality Date  . NO PAST SURGERIES       OB History    Gravida  2   Para  2   Term  2   Preterm      AB      Living  2     SAB      TAB      Ectopic      Multiple  0   Live Births  2           Family History  Problem Relation Age of Onset  . Depression Mother   . Healthy Brother   . Healthy Sister   . Hypertension  Maternal Grandmother   . Depression Maternal Grandmother   . Asthma Maternal Grandmother   . Diabetes Maternal Grandmother   . Heart disease Maternal Grandmother   . Hearing loss Maternal Grandfather   . COPD Maternal Grandfather     Social History   Tobacco Use  . Smoking status: Never Smoker  . Smokeless tobacco: Never Used  Substance Use Topics  . Alcohol use: No    Alcohol/week: 0.0 standard drinks  . Drug use: No    Home Medications Prior to Admission medications   Medication Sig Start Date End Date Taking? Authorizing Provider  acetaminophen (TYLENOL) 500 MG tablet Take 500 mg by mouth every 6 (six) hours as needed for moderate pain or headache.     [provider]  escitalopram (LEXAPRO) 10 MG tablet Take 1 tablet (10 mg total) by mouth daily. 05/03/17 08/01/17  Marcille Buffy D, CNM  ibuprofen (ADVIL,MOTRIN) 800 MG tablet Take 1 tablet (800 mg total) by mouth 3 (three) times daily. 07/20/17   Glendell Docker, NP  Prenat-FeAsp-Meth-FA-DHA w/o A (PRENATE PIXIE) 10-0.6-0.4-200 MG CAPS Take 1 capsule by  mouth daily. 10/31/16   Anyanwu, Jethro Bastos, MD    Allergies    Corn-containing products, Other, and Percocet [oxycodone-acetaminophen]  Review of Systems   Review of Systems  Constitutional: Negative for fever.  HENT: Positive for ear pain. Negative for ear discharge and sore throat.     Physical Exam Updated Vital Signs There were no vitals taken for this visit.  Physical Exam Vitals and nursing note reviewed.  Constitutional:      General: She is not in acute distress.    Appearance: She is well-developed.  HENT:     Head: Atraumatic.     Ears:     Comments: Left ear: TM is erythematous but intact.  Effusion noted throughout left ear and TM is opaque.  No foreign body noted.  Normal ear canal.  No tenderness to manipulation of left earlobe.  No evidence of mastoiditis.  Right ear: TM is opaque with effusion but not erythematous and nontender. Eyes:      Conjunctiva/sclera: Conjunctivae normal.  Musculoskeletal:     Cervical back: Neck supple.  Skin:    Findings: No rash.  Neurological:     Mental Status: She is alert.     ED Results / Procedures / Treatments   Labs (all labs ordered are listed, but only abnormal results are displayed) Labs Reviewed - No data to display  EKG None  Radiology No results found.  Procedures Procedures (including critical care time)  Medications Ordered in ED Medications - No data to display  ED Course  I have reviewed the triage vital signs and the nursing notes.  Pertinent labs & imaging results that were available during my care of the patient were reviewed by me and considered in my medical decision making (see chart for details).    MDM Rules/Calculators/A&P                      BP (!) 111/96 (BP Location: Left Arm)   Pulse 76   Temp 97.9 F (36.6 C) (Oral)   Resp 20   Ht 5\' 5"  (1.651 m)   Wt 103.9 kg   SpO2 97%   BMI 38.11 kg/m   Final Clinical Impression(s) / ED Diagnoses Final diagnoses:  Acute suppurative otitis media of left ear without spontaneous rupture of tympanic membrane, recurrence not specified    Rx / DC Orders ED Discharge Orders         Ordered    amoxicillin-clavulanate (AUGMENTIN) 875-125 MG tablet  2 times daily     03/11/19 0742    ibuprofen (ADVIL) 600 MG tablet  Every 6 hours PRN     03/11/19 0742         7:33 AM Patient presents with left ear discomfort since yesterday.  Patient felt that there may have been a foreign body in the ear.  On exam she has what appears to be a raging otitis media without any foreign body noted.  Hearing is intact. Doubt mastoiditis or other deep tissue infection. Will discharge home with antibiotic.  I also encourage patient to avoid Q-tip use.   03/13/19, PA-C 03/11/19 03/13/19    [id number], MD 03/11/19 [phone]

## 2019-08-10 ENCOUNTER — Other Ambulatory Visit: Payer: BLUE CROSS/BLUE SHIELD

## 2020-03-30 ENCOUNTER — Other Ambulatory Visit: Payer: Self-pay

## 2020-03-30 DIAGNOSIS — F53 Postpartum depression: Secondary | ICD-10-CM

## 2020-03-30 MED ORDER — ESCITALOPRAM OXALATE 10 MG PO TABS: 10.0000 mg | ORAL_TABLET | Freq: Every day | ORAL | 2 refills | Status: DC

## 2020-04-12 ENCOUNTER — Other Ambulatory Visit: Payer: Self-pay | Admitting: Obstetrics and Gynecology

## 2020-04-12 DIAGNOSIS — F53 Postpartum depression: Secondary | ICD-10-CM

## 2020-06-14 ENCOUNTER — Encounter (HOSPITAL_COMMUNITY): Payer: Self-pay

## 2020-06-14 ENCOUNTER — Other Ambulatory Visit: Payer: Self-pay

## 2020-06-14 ENCOUNTER — Emergency Department (HOSPITAL_COMMUNITY)
Admission: EM | Admit: 2020-06-14 | Discharge: 2020-06-15 | Disposition: A | Discharge status: Home / Self Care | Payer: BC Managed Care – PPO | Attending: Emergency Medicine | Admitting: Emergency Medicine

## 2020-06-14 DIAGNOSIS — R110 Nausea: Secondary | ICD-10-CM | POA: Diagnosis not present

## 2020-06-14 DIAGNOSIS — M545 Low back pain, unspecified: Secondary | ICD-10-CM | POA: Insufficient documentation

## 2020-06-14 LAB — URINALYSIS, ROUTINE W REFLEX MICROSCOPIC
Bilirubin Urine: NEGATIVE
Glucose, UA: NEGATIVE mg/dL
Hgb urine dipstick: NEGATIVE
Ketones, ur: NEGATIVE mg/dL
Nitrite: NEGATIVE
Protein, ur: NEGATIVE mg/dL
Specific Gravity, Urine: 1.021 (ref 1.005–1.030)
WBC, UA: >50 WBC/hpf — ABNORMAL HIGH (ref 0–5)
pH: 6 (ref 5.0–8.0)

## 2020-06-14 LAB — CBC WITH DIFFERENTIAL/PLATELET
Abs Immature Granulocytes: 0.06 10*3/uL (ref 0.00–0.07)
Basophils Absolute: 0 10*3/uL (ref 0.0–0.1)
Basophils Relative: 0 %
Eosinophils Absolute: 0 10*3/uL (ref 0.0–0.5)
Eosinophils Relative: 0 %
HCT: 43.2 % (ref 36.0–46.0)
Hemoglobin: 14.6 g/dL (ref 12.0–15.0)
Immature Granulocytes: 1 %
Lymphocytes Relative: 9 %
Lymphs Abs: 1 10*3/uL (ref 0.7–4.0)
MCH: 28.3 pg (ref 26.0–34.0)
MCHC: 33.8 g/dL (ref 30.0–36.0)
MCV: 83.9 fL (ref 80.0–100.0)
Monocytes Absolute: 0.6 10*3/uL (ref 0.1–1.0)
Monocytes Relative: 5 %
Neutro Abs: 10 10*3/uL — ABNORMAL HIGH (ref 1.7–7.7)
Neutrophils Relative %: 85 %
Platelets: 316 10*3/uL (ref 150–400)
RBC: 5.15 MIL/uL — ABNORMAL HIGH (ref 3.87–5.11)
RDW: 12.3 % (ref 11.5–15.5)
WBC: 11.7 10*3/uL — ABNORMAL HIGH (ref 4.0–10.5)
nRBC: 0 % (ref 0.0–0.2)

## 2020-06-14 LAB — COMPREHENSIVE METABOLIC PANEL
ALT: 115 U/L — ABNORMAL HIGH (ref 0–44)
AST: 49 U/L — ABNORMAL HIGH (ref 15–41)
Albumin: 4.2 g/dL (ref 3.5–5.0)
Alkaline Phosphatase: 63 U/L (ref 38–126)
Anion gap: 11 (ref 5–15)
BUN: 13 mg/dL (ref 6–20)
CO2: 23 mmol/L (ref 22–32)
Calcium: 9.5 mg/dL (ref 8.9–10.3)
Chloride: 101 mmol/L (ref 98–111)
Creatinine, Ser: 0.66 mg/dL (ref 0.44–1.00)
GFR, Estimated: >60 mL/min (ref >60)
Glucose, Bld: 95 mg/dL (ref 70–99)
Potassium: 3.7 mmol/L (ref 3.5–5.1)
Sodium: 135 mmol/L (ref 135–145)
Total Bilirubin: 0.8 mg/dL (ref 0.3–1.2)
Total Protein: 8.2 g/dL — ABNORMAL HIGH (ref 6.5–8.1)

## 2020-06-14 LAB — LIPASE, BLOOD: Lipase: 40 U/L (ref 11–51)

## 2020-06-14 NOTE — ED Provider Notes (Signed)
Emergency Medicine Provider Triage Evaluation Note  Barbara [name] , a 25 y.o. female  was evaluated in triage.  Pt complains of nausea and right flank pain that started this morning. It does not radiate. Pain is constant, worse with movement. Started acutely without an obvious cause or recent trauma.   Reports history of previous kidney infection.    Last depo shot was one year ago. Unsure when her LMP was. Not on any birth control. She hasn't taken any pregnancy tests and is unsure if she could be pregnant  Review of Systems  Positive: Right low back pain, nausea  Negative: Dysuria, hematuria, increased urinary frequency, fevers, chills, abdominal pain, diarrhea, constipation   Physical Exam  Ht 5\' 5"  (1.651 m)   Wt 103.9 kg   BMI 38.12 kg/m  Gen:   Awake, no distress   Resp:  Normal effort  MSK:   Moves extremities without difficulty.  Abd:  Right CVAT +, abdomen not TTP. No guarding. No rebound.   Medical Decision Making  Medically screening exam initiated at 8:51 PM.  Appropriate orders placed.  Barbara [name] was informed that the remainder of the evaluation will be completed by another provider, this initial triage assessment does not replace that evaluation, and the importance of remaining in the ED until their evaluation is complete.  Right flank pain, nausea    [name], PA-C 06/14/20 2056    2057, MD 06/19/20 1600

## 2020-06-14 NOTE — ED Triage Notes (Signed)
Nausea and lower back pain started this morning. Denies any vomiting, painful urination, abdominal pain.

## 2020-06-15 DIAGNOSIS — M545 Low back pain, unspecified: Secondary | ICD-10-CM | POA: Diagnosis not present

## 2020-06-15 LAB — PREGNANCY, URINE: Preg Test, Ur: NEGATIVE

## 2020-06-15 MED ORDER — SODIUM CHLORIDE 0.9 % IV BOLUS
1000.0000 mL | Freq: Once | INTRAVENOUS | Status: AC
  Administered 2020-06-15: 1000 mL via INTRAVENOUS

## 2020-06-15 MED ORDER — KETOROLAC TROMETHAMINE 30 MG/ML IJ SOLN
30.0000 mg | Freq: Once | INTRAMUSCULAR | Status: AC
  Administered 2020-06-15: 30 mg via INTRAVENOUS
  Filled 2020-06-15: qty 1

## 2020-06-15 MED ORDER — ONDANSETRON HCL 4 MG/2ML IJ SOLN
4.0000 mg | Freq: Once | INTRAMUSCULAR | Status: AC
  Administered 2020-06-15: 4 mg via INTRAVENOUS
  Filled 2020-06-15: qty 2

## 2020-06-15 NOTE — ED Provider Notes (Signed)
[facility] EMERGENCY DEPARTMENT Provider Note   CSN: [id number] Arrival date & time: 06/14/20  2031     History Chief Complaint  Patient presents with  . Nausea  . Back Pain    Barbara [name] is a 25 y.o. female.  HPI   26 year old female presents the emergency department with lower back pain.  Patient states that the pain is running across her lower back.  She has been experiencing nausea but denies any abdominal pain, diarrhea, genitourinary complaints, vaginal bleeding/discharge.  She has had no recent fever, no admitted history of kidney stones.  Past Medical History:  Diagnosis Date  . Anxiety   . Back pain   . Depression    postpartum depression  . Gestational diabetes    diet controlled  . Hemiparesis ([facility]) 08/31/2014   Occasional, evaluated by Neurology  . 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 UTI (urinary tract infection)     Patient Active Problem List   Diagnosis Date Noted  . Gestational hypertension 05/01/2017  . GDM (gestational diabetes mellitus) 03/14/2017  . Family history of cleft palate 02/05/2017  . Narcolepsy 09/19/2016  . Depressed mood with postpartum onset 01/18/2014    Past Surgical History:  Procedure Laterality Date  . NO PAST SURGERIES       OB History    Gravida  2   Para  2   Term  2   Preterm      AB      Living  2     SAB      IAB      Ectopic      Multiple  0   Live Births  2           Family History  Problem Relation Age of Onset  . Depression Mother   . Healthy Brother   . Healthy Sister   . Hypertension Maternal Grandmother   . Depression Maternal Grandmother   . Asthma Maternal Grandmother   . Diabetes Maternal Grandmother   . Heart disease Maternal Grandmother   . Hearing loss Maternal Grandfather   . COPD Maternal Grandfather     Social History   Tobacco Use  . Smoking status: Never Smoker  . Smokeless tobacco: Never  Used  Vaping Use  . Vaping Use: Never used  Substance Use Topics  . Alcohol use: No    Alcohol/week: 0.0 standard drinks  . Drug use: No    Home Medications Prior to Admission medications   Medication Sig Start Date End Date Taking? Authorizing Provider  acetaminophen (TYLENOL) 500 MG tablet Take 500 mg by mouth every 6 (six) hours as needed for moderate pain or headache.    Yes [provider]  FLUoxetine (PROZAC) 10 MG capsule Take 20 mg by mouth daily. 06/09/20  Yes [provider]    Allergies    Corn-containing products, Percocet [oxycodone-acetaminophen], and Other  Review of Systems   Review of Systems  Constitutional: Negative for chills and fever.  HENT: Negative for congestion.   Eyes: Negative for visual disturbance.  Respiratory: Negative for shortness of breath.   Cardiovascular: Negative for chest pain.  Gastrointestinal: Positive for nausea. Negative for abdominal pain, diarrhea and vomiting.  Genitourinary: Positive for difficulty urinating. Negative for dysuria, hematuria, pelvic pain, vaginal bleeding and vaginal discharge.  Musculoskeletal: Positive for back pain.  Skin: Negative for rash.  Neurological: Negative for headaches.  Physical Exam Updated Vital Signs BP 107/69   Pulse 76   Temp 98.8 F (37.1 C) (Oral)   Resp 18   Ht 5\' 5"  (1.651 m)   Wt 103.9 kg   SpO2 97%   BMI 38.12 kg/m   Physical Exam Vitals and nursing note reviewed.  Constitutional:      Appearance: Normal appearance.  HENT:     Head: Normocephalic.     Mouth/Throat:     Mouth: Mucous membranes are moist.  Cardiovascular:     Rate and Rhythm: Normal rate.  Pulmonary:     Effort: Pulmonary effort is normal. No respiratory distress.  Abdominal:     Palpations: Abdomen is soft.     Tenderness: There is no abdominal tenderness. There is no guarding.  Musculoskeletal:     Comments: Mild bilateral lower back pain reproducible with palpation, no midline  spinal tenderness, lower extremities are neurovascularly intact  Skin:    General: Skin is warm.  Neurological:     Mental Status: She is alert and oriented to person, place, and time. Mental status is at baseline.  Psychiatric:        Mood and Affect: Mood normal.     ED Results / Procedures / Treatments   Labs (all labs ordered are listed, but only abnormal results are displayed) Labs Reviewed  CBC WITH DIFFERENTIAL/PLATELET - Abnormal; Notable for the following components:      Result Value   WBC 11.7 (*)    RBC 5.15 (*)    Neutro Abs 10.0 (*)    All other components within normal limits  COMPREHENSIVE METABOLIC PANEL - Abnormal; Notable for the following components:   Total Protein 8.2 (*)    AST 49 (*)    ALT 115 (*)    All other components within normal limits  URINALYSIS, ROUTINE W REFLEX MICROSCOPIC - Abnormal; Notable for the following components:   APPearance CLOUDY (*)    Leukocytes,Ua LARGE (*)    WBC, UA >50 (*)    Bacteria, UA RARE (*)    All other components within normal limits  URINE CULTURE  LIPASE, BLOOD  PREGNANCY, URINE    EKG None  Radiology No results found.  Procedures Procedures   Medications Ordered in ED Medications  ketorolac (TORADOL) 30 MG/ML injection 30 mg (has no administration in time range)  sodium chloride 0.9 % bolus 1,000 mL (has no administration in time range)  ondansetron (ZOFRAN) injection 4 mg (has no administration in time range)    ED Course  I have reviewed the triage vital signs and the nursing notes.  Pertinent labs & imaging results that were available during my care of the patient were reviewed by me and considered in my medical decision making (see chart for details).    MDM Rules/Calculators/A&P                          25 year old female presents emergency department for bilateral lower back pain and nausea without vomiting.  No radiation into her lower extremities, no associated neurologic symptoms.   Denies any abdominal pain, abdomen is nontender.  Vitals are normal.  Blood work shows mildly elevated liver function test with a normal bilirubin, no right upper quadrant tenderness, negative Murphy sign. No vomiting in department, nausea resolved with zofran.  Urinalysis has leukocytes and bacteria.  Patient is adamant about no vaginal bleeding or discharge, no symptoms of STD.  Urine culture was sent.  She denies any dysuria or symptoms of UTI, will hold treatment and follow-up urine culture.  Patient is tender on palpation, this seems musculoskeletal.  Will treat symptomatically.  No need for emergent imaging at this time.  Patient will be discharged and treated as an outpatient.  Discharge plan and strict return to ED precautions discussed, patient verbalizes understanding and agreement.  Final Clinical Impression(s) / ED Diagnoses Final diagnoses:  None    Rx / DC Orders ED Discharge Orders    None       Rozelle Logan, DO 06/15/20 6063

## 2020-06-15 NOTE — ED Notes (Signed)
Recent uti one month ago  Pt has frequent utis  She has pain in her lower back and nausea since yesterday  lmp just recent came off the depo shot

## 2020-06-15 NOTE — Discharge Instructions (Addendum)
You have been seen and discharged from the emergency department.  Take Tylenol and ibuprofen as needed for pain control.  Your blood work showed slightly elevated liver function test.  Follow-up with your primary provider for reevaluation and further care.  You will require repeat blood work to ensure that this normalizes.  You may require an outpatient ultrasound.  Take home medications as prescribed. If you have any worsening symptoms, fever, abdominal pain, vaginal bleeding/discharge or further concerns for your health please return to an emergency department for further evaluation.

## 2020-06-16 LAB — URINE CULTURE: Culture: <10000 — AB

## 2020-08-11 ENCOUNTER — Emergency Department (HOSPITAL_COMMUNITY)
Admission: EM | Admit: 2020-08-11 | Discharge: 2020-08-11 | Disposition: A | Discharge status: Home / Self Care | Payer: BC Managed Care – PPO | Attending: Emergency Medicine | Admitting: Emergency Medicine

## 2020-08-11 ENCOUNTER — Emergency Department (HOSPITAL_COMMUNITY): Payer: BC Managed Care – PPO

## 2020-08-11 ENCOUNTER — Encounter (HOSPITAL_COMMUNITY): Payer: Self-pay | Admitting: Emergency Medicine

## 2020-08-11 ENCOUNTER — Other Ambulatory Visit: Payer: Self-pay

## 2020-08-11 DIAGNOSIS — R7401 Elevation of levels of liver transaminase levels: Secondary | ICD-10-CM | POA: Diagnosis not present

## 2020-08-11 DIAGNOSIS — R1031 Right lower quadrant pain: Secondary | ICD-10-CM | POA: Diagnosis present

## 2020-08-11 DIAGNOSIS — N12 Tubulo-interstitial nephritis, not specified as acute or chronic: Secondary | ICD-10-CM | POA: Insufficient documentation

## 2020-08-11 DIAGNOSIS — R000 Tachycardia, unspecified: Secondary | ICD-10-CM | POA: Insufficient documentation

## 2020-08-11 LAB — LIPASE, BLOOD: Lipase: 48 U/L (ref 11–51)

## 2020-08-11 LAB — CBC
HCT: 40.5 % (ref 36.0–46.0)
Hemoglobin: 13.5 g/dL (ref 12.0–15.0)
MCH: 28.1 pg (ref 26.0–34.0)
MCHC: 33.3 g/dL (ref 30.0–36.0)
MCV: 84.2 fL (ref 80.0–100.0)
Platelets: 333 10*3/uL (ref 150–400)
RBC: 4.81 MIL/uL (ref 3.87–5.11)
RDW: 12.3 % (ref 11.5–15.5)
WBC: 18.4 10*3/uL — ABNORMAL HIGH (ref 4.0–10.5)
nRBC: 0 % (ref 0.0–0.2)

## 2020-08-11 LAB — URINALYSIS, ROUTINE W REFLEX MICROSCOPIC
Bilirubin Urine: NEGATIVE
Glucose, UA: NEGATIVE mg/dL
Ketones, ur: NEGATIVE mg/dL
Nitrite: POSITIVE — AB
Protein, ur: 100 mg/dL — AB
RBC / HPF: >50 RBC/hpf — ABNORMAL HIGH (ref 0–5)
Specific Gravity, Urine: 1.016 (ref 1.005–1.030)
WBC, UA: >50 WBC/hpf — ABNORMAL HIGH (ref 0–5)
pH: 5 (ref 5.0–8.0)

## 2020-08-11 LAB — COMPREHENSIVE METABOLIC PANEL
ALT: 110 U/L — ABNORMAL HIGH (ref 0–44)
AST: 59 U/L — ABNORMAL HIGH (ref 15–41)
Albumin: 3.4 g/dL — ABNORMAL LOW (ref 3.5–5.0)
Alkaline Phosphatase: 61 U/L (ref 38–126)
Anion gap: 9 (ref 5–15)
BUN: 6 mg/dL (ref 6–20)
CO2: 22 mmol/L (ref 22–32)
Calcium: 9.4 mg/dL (ref 8.9–10.3)
Chloride: 105 mmol/L (ref 98–111)
Creatinine, Ser: 0.61 mg/dL (ref 0.44–1.00)
GFR, Estimated: >60 mL/min (ref >60)
Glucose, Bld: 139 mg/dL — ABNORMAL HIGH (ref 70–99)
Potassium: 3.8 mmol/L (ref 3.5–5.1)
Sodium: 136 mmol/L (ref 135–145)
Total Bilirubin: 0.6 mg/dL (ref 0.3–1.2)
Total Protein: 7.1 g/dL (ref 6.5–8.1)

## 2020-08-11 LAB — I-STAT BETA HCG BLOOD, ED (MC, WL, AP ONLY): I-stat hCG, quantitative: <5 m[IU]/mL (ref <5)

## 2020-08-11 MED ORDER — KETOROLAC TROMETHAMINE 30 MG/ML IJ SOLN
30.0000 mg | Freq: Once | INTRAMUSCULAR | Status: AC
  Administered 2020-08-11: 30 mg via INTRAVENOUS
  Filled 2020-08-11: qty 1

## 2020-08-11 MED ORDER — SODIUM CHLORIDE 0.9 % IV SOLN
1.0000 g | Freq: Once | INTRAVENOUS | Status: AC
  Administered 2020-08-11: 1 g via INTRAVENOUS
  Filled 2020-08-11: qty 10

## 2020-08-11 MED ORDER — IBUPROFEN 600 MG PO TABS: 600.0000 mg | ORAL_TABLET | Freq: Four times a day (QID) | ORAL | 0 refills | Status: DC | PRN

## 2020-08-11 MED ORDER — CEPHALEXIN 500 MG PO CAPS: 500.0000 mg | ORAL_CAPSULE | Freq: Three times a day (TID) | ORAL | 0 refills | Status: AC

## 2020-08-11 MED ORDER — HYDROCODONE-ACETAMINOPHEN 5-325 MG PO TABS
1.0000 | ORAL_TABLET | Freq: Once | ORAL | Status: AC
  Administered 2020-08-11: 1 via ORAL
  Filled 2020-08-11: qty 1

## 2020-08-11 MED ORDER — MORPHINE SULFATE (PF) 4 MG/ML IV SOLN
4.0000 mg | Freq: Once | INTRAVENOUS | Status: AC
  Administered 2020-08-11: 4 mg via INTRAVENOUS
  Filled 2020-08-11: qty 1

## 2020-08-11 MED ORDER — IOHEXOL 300 MG/ML  SOLN
100.0000 mL | Freq: Once | INTRAMUSCULAR | Status: AC | PRN
  Administered 2020-08-11: 100 mL via INTRAVENOUS

## 2020-08-11 NOTE — ED Provider Notes (Signed)
[facility] EMERGENCY DEPARTMENT Provider Note   CSN: [id number] Arrival date & time: 08/11/20  8938     History Chief Complaint  Patient presents with   Abdominal Pain    Barbara [name] is a 25 y.o. female.  The history is provided by the patient and medical records. No language interpreter was used.  Abdominal Pain   25 year old year old female with hx of recurrent UTI presenting c/o abdominal pain. Pt report since last night she developed pain to the side side of her abdomen.  Pain is described as a stabbing sensation starting from there right lower abdomen and radiates towards her right breast, moderate in intensity, worse with positional changes.  Also endorse urinary frequency and urgency with mild discomfort. Denies fever, cp, sob, back pain, n/v/d/c.  No vaginal bleeding or vaginal discharge.  No new sexual partner.  No specific treatment tried.    Past Medical History:  Diagnosis Date   Anxiety    Back pain    Depression    postpartum depression   Gestational diabetes    diet controlled   Hemiparesis (HCC) 08/31/2014   Occasional, evaluated by Neurology   Narcolepsy with cataplexy    Tied to emotions, also been evaluated by neurology   UTI (urinary tract infection)     Patient Active Problem List   Diagnosis Date Noted   Gestational hypertension 05/01/2017   GDM (gestational diabetes mellitus) 03/14/2017   Family history of cleft palate 02/05/2017   Narcolepsy 09/19/2016   Depressed mood with postpartum onset 01/18/2014    Past Surgical History:  Procedure Laterality Date   NO PAST SURGERIES       OB History     Gravida  2   Para  2   Term  2   Preterm      AB      Living  2      SAB      IAB      Ectopic      Multiple  0   Live Births  2           Family History  Problem Relation Age of Onset   Depression Mother    Healthy Brother    Healthy Sister    Hypertension Maternal Grandmother    Depression  Maternal Grandmother    Asthma Maternal Grandmother    Diabetes Maternal Grandmother    Heart disease Maternal Grandmother    Hearing loss Maternal Grandfather    COPD Maternal Grandfather     Social History   Tobacco Use   Smoking status: Never   Smokeless tobacco: Never  Vaping Use   Vaping Use: Never used  Substance Use Topics   Alcohol use: No    Alcohol/week: 0.0 standard drinks   Drug use: No    Home Medications Prior to Admission medications   Medication Sig Start Date End Date Taking? Authorizing Provider  acetaminophen (TYLENOL) 500 MG tablet Take 500 mg by mouth every 6 (six) hours as needed for moderate pain or headache.     [provider]  FLUoxetine (PROZAC) 10 MG capsule Take 20 mg by mouth daily. 06/09/20   [provider]    Allergies    Corn-containing products, Percocet [oxycodone-acetaminophen], and Other  Review of Systems   Review of Systems  Gastrointestinal:  Positive for abdominal pain.  All other systems reviewed and are negative.  Physical Exam Updated Vital Signs BP 128/77 (BP Location:  Right Arm)   Pulse (!) 105   Temp 98.6 F (37 C)   Resp 16   Ht 5\' 5"  (1.651 m)   Wt 103.4 kg   LMP  (LMP Unknown)   SpO2 96%   BMI 37.94 kg/m   Physical Exam Vitals and nursing note reviewed.  Constitutional:      General: She is not in acute distress.    Appearance: She is well-developed. She is obese.  HENT:     Head: Atraumatic.  Eyes:     Conjunctiva/sclera: Conjunctivae normal.  Cardiovascular:     Rate and Rhythm: Tachycardia present.  Pulmonary:     Effort: Pulmonary effort is normal.  Abdominal:     General: Abdomen is flat.     Palpations: Abdomen is soft.     Tenderness: There is abdominal tenderness in the right upper quadrant and right lower quadrant. There is no right CVA tenderness, left CVA tenderness, guarding or rebound. Negative signs include Murphy's sign.  Musculoskeletal:     Cervical back: Neck  supple.  Skin:    Findings: No rash.  Neurological:     Mental Status: She is alert.  Psychiatric:        Mood and Affect: Mood normal.    ED Results / Procedures / Treatments   Labs (all labs ordered are listed, but only abnormal results are displayed) Labs Reviewed  COMPREHENSIVE METABOLIC PANEL - Abnormal; Notable for the following components:      Result Value   Glucose, Bld 139 (*)    Albumin 3.4 (*)    AST 59 (*)    ALT 110 (*)    All other components within normal limits  CBC - Abnormal; Notable for the following components:   WBC 18.4 (*)    All other components within normal limits  URINALYSIS, ROUTINE W REFLEX MICROSCOPIC - Abnormal; Notable for the following components:   APPearance CLOUDY (*)    Hgb urine dipstick MODERATE (*)    Protein, ur 100 (*)    Nitrite POSITIVE (*)    Leukocytes,Ua LARGE (*)    RBC / HPF >50 (*)    WBC, UA >50 (*)    Bacteria, UA FEW (*)    All other components within normal limits  LIPASE, BLOOD  I-STAT BETA HCG BLOOD, ED ([facility], [facility], [facility] ONLY)    EKG None  Radiology CT ABDOMEN PELVIS W CONTRAST  Result Date: 08/11/2020 CLINICAL DATA:  Right lower quadrant pain.  Appendicitis suspected. EXAM: CT ABDOMEN AND PELVIS WITH CONTRAST TECHNIQUE: Multidetector CT imaging of the abdomen and pelvis was performed using the standard protocol following bolus administration of intravenous contrast. CONTRAST:  100mL OMNIPAQUE IOHEXOL 300 MG/ML  SOLN COMPARISON:  04/04/2012 FINDINGS: Lower chest: Unremarkable Hepatobiliary: The liver shows diffusely decreased attenuation suggesting fat deposition. 17 mm subcapsular focus of hyper attenuation/enhancement noted posterior segment IV on 25/3. 12 mm enhancing focus identified in the right liver on 23/3. Subcapsular increased attenuation noted along the gallbladder fossa. There is no evidence for gallstones, gallbladder wall thickening, or pericholecystic fluid. No intrahepatic or extrahepatic biliary dilation.  Pancreas: No focal mass lesion. No dilatation of the main duct. No intraparenchymal cyst. No peripancreatic edema. Spleen: No splenomegaly. No focal mass lesion. Adrenals/Urinary Tract: No adrenal nodule or mass. Kidneys unremarkable. Although quite subtle, the proximal right ureter appears ill-defined and slightly patulous with some apparent minimal periureteric edema (see axial 62/3 and coronal 92/6). No definite stone can be identified in the distal right  ureter. No bladder stone. Left ureter unremarkable. The urinary bladder appears normal for the degree of distention. Stomach/Bowel: Stomach is decompressed. Duodenum is normally positioned as is the ligament of Treitz. No small bowel wall thickening. No small bowel dilatation. The terminal ileum is normal. The appendix is normal. No gross colonic mass. No colonic wall thickening. Vascular/Lymphatic: No abdominal aortic aneurysm. There is no gastrohepatic or hepatoduodenal ligament lymphadenopathy. No retroperitoneal or mesenteric lymphadenopathy. No pelvic sidewall lymphadenopathy. Reproductive: The uterus is unremarkable.  There is no adnexal mass. Other: Trace free fluid noted in the cul-de-sac. Musculoskeletal: No worrisome lytic or sclerotic osseous abnormality. IMPRESSION: 1. Although quite subtle, the proximal right ureter appears ill-defined and slightly patulous with some apparent minimal periureteric edema. No definite stone can be identified in the distal right ureter. Imaging features may be related to a recently passed stone or infection. 2. Normal appendix and terminal ileum.  No right adnexal mass. 3. Hepatic steatosis. 4. Two small foci of increased attenuation in the right liver may represent enhancing lesions or fatty sparing. These are probably benign, but MRI of the abdomen without and with contrast recommended to further evaluate. 5. Trace free fluid in the cul-de-sac. This can be physiologic in a premenopausal female. Electronically Signed    By: Kennith Center M.D.   On: 08/11/2020 14:06    Procedures Procedures   Medications Ordered in ED Medications  cefTRIAXone (ROCEPHIN) 1 g in sodium chloride 0.9 % 100 mL IVPB (0 g Intravenous Stopped 08/11/20 1242)  morphine 4 MG/ML injection 4 mg (4 mg Intravenous Given 08/11/20 1212)  iohexol (OMNIPAQUE) 300 MG/ML solution 100 mL (100 mLs Intravenous Contrast Given 08/11/20 1317)  ketorolac (TORADOL) 30 MG/ML injection 30 mg (30 mg Intravenous Given 08/11/20 1459)    ED Course  I have reviewed the triage vital signs and the nursing notes.  Pertinent labs & imaging results that were available during my care of the patient were reviewed by me and considered in my medical decision making (see chart for details).    MDM Rules/Calculators/A&P                          BP 128/77 (BP Location: Right Arm)   Pulse (!) 105   Temp 98.6 F (37 C)   Resp 16   Ht 5\' 5"  (1.651 m)   Wt 103.4 kg   LMP  (LMP Unknown)   SpO2 96%   BMI 37.94 kg/m   Final Clinical Impression(s) / ED Diagnoses Final diagnoses:  Pyelonephritis    Rx / DC Orders ED Discharge Orders     None      11:50 AM Pt endorsed pain to right side abdomen and dysuria since yesterday.  She does have reproducible right sided abdominal pain on exam.  UA shows evidence of UTI.  Elevated WBC of 18.4.  mild transaminitis with AST 59 and ALT 110.  Due to her discomfort, will obtain abd/pelvis CT.  Rocephin given, urine culture sent.  Discussed STI check, pt denies having new sexual partner and will hold off on STI screening at this time.    2:17 PM Abd and pelvis CT shows R proximal ureter appears ill-defined which may suggest recently passed stone or infection.  Normal appendix.  Incidental liver lesion, likely benign but radiologist recommend MRI can be done to evaluate further.  I discussed this finding with pt. Recommend outpt MRI of abdomen without and with contrast as needed since pt  does have mildly elevated liver enzyme.     2:44 PM At this time pt appears uncomfortable, leaning over the bed holding the R side of her abdomen.  Pain is consistent with abnormal finding on CT.  Will order toradol for pain.  Abx was given.   3:13 PM Pt sign out to oncoming provider who will reassess pt and determine disposition.  Anticipate dc home with bactrim as treatment for pyelonephritis.    Fayrene Helper, PA-C 08/11/20 1517    Gwyneth Sprout, MD 08/12/20 [phone]

## 2020-08-11 NOTE — Discharge Instructions (Addendum)
Your symptoms are consistent with right kidney infection.  Take antibiotic as prescribed for the full duration.  Take ibuprofen for pain.  CT scan today incidentally shows some small lesions in your liver.  Your liver enzyme is mildly elevated.  Please follow up with your doctor for further assessment.  You may benefit from an abdominal MRI for better view.  Return if you have any concerns.

## 2020-08-11 NOTE — ED Triage Notes (Signed)
Pt reports pain from pelvic area to rib cage that started last night. Denies n/v/d, denies vaginal discharge/bleeding, denies chest pain.

## 2020-08-11 NOTE — ED Provider Notes (Signed)
I received this patient in signout from [name].  She has been diagnosed with pyelonephritis and given a dose of Toradol.  At time of signout, awaiting clinical improvement prior to discharge.  Had already received antibiotics in the ED.  On reassessment, patient is well-appearing.  She continues to have tenderness in the right lower quadrant but is otherwise stable for discharge and vital signs are reassuring.  Prescriptions for ibuprofen and Keflex have been sent to her pharmacy.  I extensively reviewed return precautions with her including worsening pain, high fevers, vomiting.  She voiced understanding.  Of note, patient has incidental finding of elevated LFTs.  She already has follow-up arranged with her PCP for this.   Desmond Tufano, Ambrose Finland, MD 08/11/20 1650

## 2020-08-13 LAB — URINE CULTURE: Culture: >=100000 — AB

## 2020-08-14 ENCOUNTER — Telehealth: Payer: Self-pay

## 2020-08-14 NOTE — Telephone Encounter (Signed)
Post ED Visit - Positive Culture Follow-up  Culture report reviewed by antimicrobial stewardship pharmacist: [facility] [x]  , Pharm.D. []  Loleta Dicker, Pharm.D., BCPS AQ-ID []  , Pharm.D., BCPS []  Celedonio Miyamoto, Pharm.D., BCPS []  [place], Garvin Fila.D., BCPS, AAHIVP []  , Pharm.D., BCPS, AAHIVP []  Georgina Pillion, PharmD, BCPS []  , PharmD, BCPS []  [place], PharmD, BCPS []  [place], PharmD []  , PharmD, BCPS []  Estella Husk, PharmD  Pharmacy Team []  Lysle Pearl, PharmD []  , PharmD []  Phillips Climes, PharmD []  , Rph []  Agapito Games) , PharmD []  Verlan Friends, PharmD []  , PharmD []  Mervyn Gay, PharmD []  , PharmD []  Vinnie Level, PharmD []  Wonda Olds, PharmD []  , PharmD []  Len Childs, PharmD   Positive urine culture Treated with Cephalexin, organism sensitive to the same and no further patient follow-up is required at this time.  08/14/2020, 8:42 AM

## 2020-11-22 ENCOUNTER — Inpatient Hospital Stay (HOSPITAL_COMMUNITY): Payer: BC Managed Care – PPO

## 2020-11-22 ENCOUNTER — Encounter (HOSPITAL_COMMUNITY): Payer: Self-pay | Admitting: *Deleted

## 2020-11-22 ENCOUNTER — Other Ambulatory Visit: Payer: Self-pay

## 2020-11-22 ENCOUNTER — Inpatient Hospital Stay (HOSPITAL_COMMUNITY)
Admission: AD | Admit: 2020-11-22 | Discharge: 2020-11-22 | Disposition: A | Discharge status: Home / Self Care | Payer: BC Managed Care – PPO | Attending: Obstetrics and Gynecology | Admitting: Obstetrics and Gynecology

## 2020-11-22 DIAGNOSIS — O26891 Other specified pregnancy related conditions, first trimester: Secondary | ICD-10-CM | POA: Insufficient documentation

## 2020-11-22 DIAGNOSIS — Z3A01 Less than 8 weeks gestation of pregnancy: Secondary | ICD-10-CM | POA: Insufficient documentation

## 2020-11-22 DIAGNOSIS — Z3490 Encounter for supervision of normal pregnancy, unspecified, unspecified trimester: Secondary | ICD-10-CM

## 2020-11-22 DIAGNOSIS — Z3A08 8 weeks gestation of pregnancy: Secondary | ICD-10-CM

## 2020-11-22 DIAGNOSIS — R109 Unspecified abdominal pain: Secondary | ICD-10-CM | POA: Diagnosis not present

## 2020-11-22 DIAGNOSIS — O210 Mild hyperemesis gravidarum: Secondary | ICD-10-CM | POA: Insufficient documentation

## 2020-11-22 DIAGNOSIS — O219 Vomiting of pregnancy, unspecified: Secondary | ICD-10-CM

## 2020-11-22 LAB — WET PREP, GENITAL
Sperm: NONE SEEN
Trich, Wet Prep: NONE SEEN
Yeast Wet Prep HPF POC: NONE SEEN

## 2020-11-22 LAB — COMPREHENSIVE METABOLIC PANEL
ALT: 34 U/L (ref 0–44)
AST: 22 U/L (ref 15–41)
Albumin: 3.7 g/dL (ref 3.5–5.0)
Alkaline Phosphatase: 54 U/L (ref 38–126)
Anion gap: 11 (ref 5–15)
BUN: 8 mg/dL (ref 6–20)
CO2: 22 mmol/L (ref 22–32)
Calcium: 9.7 mg/dL (ref 8.9–10.3)
Chloride: 101 mmol/L (ref 98–111)
Creatinine, Ser: 0.7 mg/dL (ref 0.44–1.00)
GFR, Estimated: >60 mL/min (ref >60)
Glucose, Bld: 97 mg/dL (ref 70–99)
Potassium: 3.7 mmol/L (ref 3.5–5.1)
Sodium: 134 mmol/L — ABNORMAL LOW (ref 135–145)
Total Bilirubin: 0.7 mg/dL (ref 0.3–1.2)
Total Protein: 7.3 g/dL (ref 6.5–8.1)

## 2020-11-22 LAB — URINALYSIS, ROUTINE W REFLEX MICROSCOPIC
Bilirubin Urine: NEGATIVE
Glucose, UA: NEGATIVE mg/dL
Hgb urine dipstick: NEGATIVE
Ketones, ur: NEGATIVE mg/dL
Nitrite: NEGATIVE
Protein, ur: 30 mg/dL — AB
Specific Gravity, Urine: 1.028 (ref 1.005–1.030)
pH: 5 (ref 5.0–8.0)

## 2020-11-22 LAB — CBC WITH DIFFERENTIAL/PLATELET
Abs Immature Granulocytes: 0.1 10*3/uL — ABNORMAL HIGH (ref 0.00–0.07)
Basophils Absolute: 0 10*3/uL (ref 0.0–0.1)
Basophils Relative: 0 %
Eosinophils Absolute: 0.1 10*3/uL (ref 0.0–0.5)
Eosinophils Relative: 1 %
HCT: 38.6 % (ref 36.0–46.0)
Hemoglobin: 13.3 g/dL (ref 12.0–15.0)
Immature Granulocytes: 1 %
Lymphocytes Relative: 21 %
Lymphs Abs: 2.6 10*3/uL (ref 0.7–4.0)
MCH: 28.9 pg (ref 26.0–34.0)
MCHC: 34.5 g/dL (ref 30.0–36.0)
MCV: 83.9 fL (ref 80.0–100.0)
Monocytes Absolute: 0.6 10*3/uL (ref 0.1–1.0)
Monocytes Relative: 5 %
Neutro Abs: 9.2 10*3/uL — ABNORMAL HIGH (ref 1.7–7.7)
Neutrophils Relative %: 72 %
Platelets: 306 10*3/uL (ref 150–400)
RBC: 4.6 MIL/uL (ref 3.87–5.11)
RDW: 12.2 % (ref 11.5–15.5)
WBC: 12.6 10*3/uL — ABNORMAL HIGH (ref 4.0–10.5)
nRBC: 0 % (ref 0.0–0.2)

## 2020-11-22 LAB — HCG, QUANTITATIVE, PREGNANCY: hCG, Beta Chain, Quant, S: 62600 m[IU]/mL — ABNORMAL HIGH (ref <5)

## 2020-11-22 LAB — I-STAT BETA HCG BLOOD, ED (MC, WL, AP ONLY): I-stat hCG, quantitative: >2000 m[IU]/mL — ABNORMAL HIGH (ref <5)

## 2020-11-22 MED ORDER — SERTRALINE HCL 50 MG PO TABS: 50.0000 mg | ORAL_TABLET | Freq: Every day | ORAL | 1 refills | Status: DC

## 2020-11-22 MED ORDER — ONDANSETRON 8 MG PO TBDP: 8.0000 mg | ORAL_TABLET | Freq: Three times a day (TID) | ORAL | 0 refills | Status: DC | PRN

## 2020-11-22 MED ORDER — ONDANSETRON 4 MG PO TBDP
8.0000 mg | ORAL_TABLET | Freq: Once | ORAL | Status: AC
  Administered 2020-11-22: 8 mg via ORAL
  Filled 2020-11-22: qty 2

## 2020-11-22 NOTE — ED Triage Notes (Addendum)
Pt believes she's about 8 weeks pregnancy. C/o NV and dizziness. 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 Also reports lower abd cramping, no vag bleeding/discharge

## 2020-11-22 NOTE — ED Provider Notes (Signed)
Emergency Medicine Provider Triage Evaluation Note  Barbara [name] , a 25 y.o. female  was evaluated in triage.  Pt complains of vomiting.  The patient reports persistent vomiting for more than 24 hours.  She has not been able to void since approximately 5 PM yesterday.  She is starting to feel more dizzy and lightheaded.  She has been unable to keep down any fluids.  She suspects that she may have a fever, but she has not checked her temperature.  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  She reports that she is having some thick, creamy vaginal discharge and some lower abdominal cramping, but no vaginal bleeding.  No shortness of breath, diarrhea, syncope, rash.  Review of Systems  Positive: Vomiting, dizziness, lightheadedness, abdominal pain, vaginal discharge, decreased urinary output Negative: Shortness of breath, diarrhea, syncope, rash  Physical Exam  BP 115/82   Pulse 90   Temp 98.4 F (36.9 C) (Oral)   Resp 16   LMP 10/08/2020   SpO2 99%  Gen:   Awake, no distress   Resp:  Normal effort  MSK:   Moves extremities without difficulty  Other:  Abdomen is soft, nontender, nondistended.  Medical Decision Making  Medically screening exam initiated at 6:41 AM.  Appropriate orders placed.  Barbara [name] was informed that the remainder of the evaluation will be completed by another provider, this initial triage assessment does not replace that evaluation, and the importance of remaining in the ED until their evaluation is complete.  Pregnancy test is pending.  If positive, she may be appropriate to go to the MAU.   Frederik Pear A, PA-C 11/22/20 0645    Gilda Crease, MD 11/22/20 [phone]

## 2020-11-22 NOTE — MAU Provider Note (Signed)
Patient Barbara [name] is a 25 y.o.  [id number]  At [redacted]w[redacted]d here with complaints of nausea/vomiting since yesterday and also abdominal cramping that started two weeks ago. She denies vaginal bleeding, abnormal discharge. She denies fever, HA, chest pain, muscle pain. She endorses some dizziness.  History     CSN: [id number]  Arrival date and time: 11/22/20 0605   None     Chief Complaint  Patient presents with   Emesis   Emesis  This is a new problem. The current episode started yesterday. The problem occurs 5 to 10 times per day. The emesis has an appearance of bile and stomach contents. There has been no fever. Associated symptoms include abdominal pain. Pertinent negatives include no diarrhea or fever.  Abdominal Pain This is a new problem. The current episode started 1 to 4 weeks ago. The problem occurs constantly. The problem has been unchanged. The pain is located in the suprapubic region. The pain is at a severity of 5/10. The quality of the pain is cramping. Associated symptoms include vomiting. Pertinent negatives include no constipation, diarrhea, dysuria or fever.   OB History     Gravida  3   Para  2   Term  2   Preterm      AB      Living  2      SAB      IAB      Ectopic      Multiple  0   Live Births  2           Past Medical History:  Diagnosis Date   Anxiety    Back pain    Depression    postpartum depression   Gestational diabetes    diet controlled   Hemiparesis (HCC) 08/31/2014   Occasional, evaluated by Neurology   Narcolepsy with cataplexy    Tied to emotions, also been evaluated by neurology   UTI (urinary tract infection)     Past Surgical History:  Procedure Laterality Date   NO PAST SURGERIES      Family History  Problem Relation Age of Onset   Depression Mother    Healthy Brother    Healthy Sister    Hypertension Maternal Grandmother    Depression Maternal Grandmother    Asthma Maternal Grandmother    Diabetes  Maternal Grandmother    Heart disease Maternal Grandmother    Hearing loss Maternal Grandfather    COPD Maternal Grandfather     Social History   Tobacco Use   Smoking status: Never   Smokeless tobacco: Never  Vaping Use   Vaping Use: Never used  Substance Use Topics   Alcohol use: No    Alcohol/week: 0.0 standard drinks   Drug use: No    Allergies:  Allergies  Allergen Reactions   Corn-Containing Products Anaphylaxis and Swelling    Pt states that it is the silk that she is allergic to.   Percocet [Oxycodone-Acetaminophen] Other (See Comments)    States feels like chest is tightening up.   Other Itching and Nausea And Vomiting    SHERBET.    Medications Prior to Admission  Medication Sig Dispense Refill Last Dose   Prenatal Vit-Fe Fumarate-FA (PRENATAL MULTIVITAMIN) TABS tablet Take 1 tablet by mouth daily at 12 noon.   Past Week   sertraline (ZOLOFT) 50 MG tablet Take 50 mg by mouth daily.   Past Month   FLUoxetine (PROZAC) 10 MG capsule Take 20 mg by mouth daily as  needed (anxiety).      ibuprofen (ADVIL) 600 MG tablet Take 1 tablet (600 mg total) by mouth every 6 (six) hours as needed. 30 tablet 0     Review of Systems  Constitutional: Negative.  Negative for fever.  HENT: Negative.    Respiratory: Negative.    Cardiovascular: Negative.   Gastrointestinal:  Positive for abdominal pain and vomiting. Negative for constipation and diarrhea.  Genitourinary:  Negative for dysuria, vaginal bleeding and vaginal discharge.  Neurological: Negative.   Hematological: Negative.   Psychiatric/Behavioral: Negative.    Physical Exam   Blood pressure 136/74, pulse 100, temperature 98.2 F (36.8 C), temperature source Oral, resp. rate 17, last menstrual period 09/23/2020, SpO2 99 %.  Physical Exam Constitutional:      Appearance: Normal appearance.  Cardiovascular:     Rate and Rhythm: Normal rate.  Abdominal:     General: Abdomen is flat.  Musculoskeletal:      Cervical back: Normal range of motion.  Skin:    General: Skin is warm.     Capillary Refill: Capillary refill takes less than 2 seconds.  Neurological:     General: No focal deficit present.     Mental Status: She is alert.    MAU Course  Procedures  MDM Patient had full ectopic work up performed due to complaints of lower abdominal pain. I have independently reviewed the [place] images, which reveal finding of SIUP with cardiac activity. Discussed finding of possible abnormal cardiac activity on [place] with Dr. Chilton Si, who recommends follow up at [place].   Patient had 1 ODT zofran; she then tolerated PO  crackers and liquids.   -cbc and cmp are normal -urine shows no sign of dehydration  Wet prep shows clue cells however patient does not want any treatment.   Assessment and Plan   1. Intrauterine pregnancy   2. Abdominal cramps   3. Morning sickness   -GC CT pending -restart Zoloft due to feelings of depression; she does not want to see a counselor right now -take miralax with zofran; take zofran -keep appt on 12/04/2020 -all questions answered  Charlesetta Garibaldi [name] 11/22/2020, 8:35 AM

## 2020-11-22 NOTE — MAU Note (Signed)
...  Barbara [name] is a 25 y.o. at approximately [redacted]w[redacted]d here in MAU reporting: "can't drink anything or keep any food down." She states this has been occurring since yesterday evening. She states she was able to eat breakfast and lunch yesterday, just not dinner. She states this morning she started feeling light headed and states the last time she urinated was yesterday around 1800. She is also endorsing intermittent lower abdominal cramping for the past two weeks but denies any vaginal bleeding. Denies any abnormal discharge or odors.  She states she has been taking "pregnant women nauseous gummies with lemon and ginger."  Patient voided here in MAU for urine sample.  Pain score: 5/10 lower abdominal cramping  LMP: 09/23/2020. She states she has irregular periods and receives the Depo shot.  Vitals:   11/22/20 0634  BP: 115/82  Pulse: 90  Resp: 16  Temp: 98.4 F (36.9 C)  SpO2: 99%      Lab orders placed from triage: UA

## 2020-11-23 LAB — GC/CHLAMYDIA PROBE AMP (~~LOC~~) NOT AT ARMC
Chlamydia: NEGATIVE
Comment: NEGATIVE
Comment: NORMAL
Neisseria Gonorrhea: NEGATIVE

## 2020-12-04 ENCOUNTER — Encounter: Payer: BC Managed Care – PPO | Admitting: Obstetrics and Gynecology

## 2020-12-28 ENCOUNTER — Ambulatory Visit (INDEPENDENT_AMBULATORY_CARE_PROVIDER_SITE_OTHER): Payer: BC Managed Care – PPO | Admitting: Obstetrics and Gynecology

## 2020-12-28 ENCOUNTER — Ambulatory Visit (INDEPENDENT_AMBULATORY_CARE_PROVIDER_SITE_OTHER): Payer: BC Managed Care – PPO

## 2020-12-28 ENCOUNTER — Encounter: Payer: Self-pay | Admitting: Obstetrics and Gynecology

## 2020-12-28 ENCOUNTER — Telehealth: Payer: Self-pay

## 2020-12-28 ENCOUNTER — Other Ambulatory Visit: Payer: Self-pay

## 2020-12-28 VITALS — BP 132/86 | HR 105 | Wt 241.0 lb

## 2020-12-28 DIAGNOSIS — O021 Missed abortion: Secondary | ICD-10-CM | POA: Insufficient documentation

## 2020-12-28 DIAGNOSIS — E669 Obesity, unspecified: Secondary | ICD-10-CM | POA: Insufficient documentation

## 2020-12-28 DIAGNOSIS — Z3480 Encounter for supervision of other normal pregnancy, unspecified trimester: Secondary | ICD-10-CM | POA: Insufficient documentation

## 2020-12-28 NOTE — Telephone Encounter (Signed)
Called patient to discuss potential surgery date/time, no answer, unable to leave message, phone just keeps ringing

## 2020-12-28 NOTE — Progress Notes (Signed)
NOB   Last pap: 09/11/20 WNL per pt pap w/ [facility] this year in care everywhere.  Genetic Screening:[facility] wants to know gender  Flu Vaccine: Declined today  Neg GC/CT: on 11/22/20  Pt wants to discuss Rx management for Zoloft.      Patient informed that the ultrasound is considered a limited obstetric ultrasound and is not intended to be a complete ultrasound exam.  Patient also informed that the ultrasound is not being completed with the intent of assessing for fetal or placental anomalies or any pelvic abnormalities. Explained that the purpose of today's ultrasound is to assess for fetal heart rate.  Patient acknowledges the purpose of the exam and the limitations of the study.

## 2020-12-28 NOTE — Progress Notes (Signed)
Obstetrics and Gynecology Visit Return Patient Evaluation  Appointment Date: 12/28/2020  Primary Care Provider: Ladora Daniel  [facility]: [facility]  Chief Complaint: missed abortion  History of Present Illness:  Barbara [name] is a 25 y.o. [phone] with above CC. Patient here for new OB. She had an u/s in the mau on 10/12 that puts her at 12/2 today; pt went for low abdominal cramping. She currently denies any Vb, fevers, chills and only some cramping   Review of Systems:  as noted in the History of Present Illness. Patient Active Problem List   Diagnosis Date Noted   BMI 30s 12/28/2020   Supervision of other normal pregnancy, antepartum 12/28/2020   History of gestational hypertension 05/01/2017   History of gestational diabetes mellitus (GDM) 03/14/2017   Family history of cleft palate 02/05/2017   Narcolepsy 09/19/2016   Depressed mood with postpartum onset 01/18/2014   Medications:  [name] had no medications administered during this visit. Current Outpatient Medications  Medication Sig Dispense Refill   acetaminophen (TYLENOL) 325 MG tablet Take 650 mg by mouth every 6 (six) hours as needed.     ondansetron (ZOFRAN ODT) 8 MG disintegrating tablet Take 1 tablet (8 mg total) by mouth every 8 (eight) hours as needed for nausea or vomiting. 90 tablet 0   Prenatal Vit-Fe Fumarate-FA (PRENATAL MULTIVITAMIN) TABS tablet Take 1 tablet by mouth daily at 12 noon.     sertraline (ZOLOFT) 50 MG tablet Take 1 tablet (50 mg total) by mouth daily. 90 tablet 1   No current facility-administered medications for this visit.    Allergies: is allergic to corn-containing products, percocet [oxycodone-acetaminophen], and other.  Physical Exam:  BP 132/86   Pulse (!) 105   Wt 241 lb (109.3 kg)   LMP 09/23/2020 (Approximate)   BMI 40.10 kg/m  Body mass index is 40.1 kg/m. General appearance: Well nourished, well developed female in no acute  distress.  Abdomen: diffusely non tender to palpation, non distended, and no masses, hernias Neuro/Psych:  Normal mood and affect.    FHTs unable to be obtained via doppler On TVUS: CRL c/w 6wks with enlarged gestational sac and no cardiac motion noted visually or on doppler  Assessment: missed AB  Plan:  1. Missed AB Patient is Rh positive.  Options reviewed with her and she desires in OR D&C. Message sent to scheduling.  - US OB Limited; Future  RTC: PRN  Cornelia Copa MD Attending [facility])

## 2020-12-29 NOTE — Addendum Note (Signed)
Addended by: Warden Fillers on: 12/29/2020 08:17 AM   Modules accepted: Orders

## 2020-12-29 NOTE — Progress Notes (Signed)
Messaged Dr. Donavan Foil on secure chat to ask if pt could have clear liquids up to 3 hours prior to surgery since her surgery does not start until 12 noon. He responded yes as long as anesthesia is ok with it.

## 2020-12-30 NOTE — Progress Notes (Signed)
Attempt made to contact the pt, phone rings, with no machine message.

## 2021-01-01 ENCOUNTER — Encounter (HOSPITAL_COMMUNITY)
Admission: RE | Disposition: A | Discharge status: Home / Self Care | Payer: Self-pay | Source: Home / Self Care | Attending: Obstetrics and Gynecology

## 2021-01-01 ENCOUNTER — Encounter (HOSPITAL_COMMUNITY): Payer: Self-pay | Admitting: Anesthesiology

## 2021-01-01 ENCOUNTER — Encounter (HOSPITAL_COMMUNITY): Payer: Self-pay | Admitting: Obstetrics and Gynecology

## 2021-01-01 ENCOUNTER — Ambulatory Visit (HOSPITAL_COMMUNITY)
Admission: RE | Admit: 2021-01-01 | Discharge: 2021-01-01 | Disposition: A | Discharge status: Home / Self Care | Payer: BC Managed Care – PPO | Attending: Obstetrics and Gynecology | Admitting: Obstetrics and Gynecology

## 2021-01-01 DIAGNOSIS — Z538 Procedure and treatment not carried out for other reasons: Secondary | ICD-10-CM | POA: Diagnosis not present

## 2021-01-01 DIAGNOSIS — O021 Missed abortion: Secondary | ICD-10-CM | POA: Diagnosis present

## 2021-01-01 SURGERY — DILATION AND EVACUATION, UTERUS
Anesthesia: Choice

## 2021-01-01 NOTE — Progress Notes (Signed)
Patient scheduled for D&E today with Dr. Donavan Foil.  Patient arrived to short stay stating she passed the fetal remains and has them with her.  Called Dr. Charlotta Newton, physician on call.  She is to call back.

## 2021-01-01 NOTE — Progress Notes (Signed)
Called to bedside due to discuss potentially cancelling surgery.  Over the weekend, she started to have painful pelvic pain and bleeding.  Pt states that she passed the baby, which she brought here with her to confirm.  She denies heavy bleeding currently  Examined specimen- confirmed products of conception  At this time, will cancel D&E and plan for outpatient follow up in 2wks.  Myna Hidalgo, DO Attending Obstetrician & Gynecologist, [facility] for [employer], [facility]

## 2021-01-01 NOTE — Progress Notes (Signed)
Patient's surgical procedure canceled. Dr. Charlotta Newton aware of patient passing fetal remains at home and has seen patient at bedside on arrival to short stay.  Patient escorted out with her sister who was present with her by Marcelino Duster, RN.  Fetal remains taken by patient.

## 2021-01-08 ENCOUNTER — Telehealth: Payer: Self-pay

## 2021-01-08 NOTE — Telephone Encounter (Signed)
Called patient to discuss potential surgery dates, phone number not in service, will schedule on first available 12/28.

## 2021-01-26 ENCOUNTER — Emergency Department (HOSPITAL_COMMUNITY)
Admission: EM | Admit: 2021-01-26 | Discharge: 2021-01-26 | Disposition: A | Discharge status: Home / Self Care | Payer: BC Managed Care – PPO | Attending: Emergency Medicine | Admitting: Emergency Medicine

## 2021-01-26 ENCOUNTER — Encounter (HOSPITAL_COMMUNITY): Payer: Self-pay

## 2021-01-26 ENCOUNTER — Emergency Department (HOSPITAL_COMMUNITY): Payer: BC Managed Care – PPO

## 2021-01-26 DIAGNOSIS — R102 Pelvic and perineal pain: Secondary | ICD-10-CM | POA: Insufficient documentation

## 2021-01-26 DIAGNOSIS — N390 Urinary tract infection, site not specified: Secondary | ICD-10-CM

## 2021-01-26 DIAGNOSIS — R1032 Left lower quadrant pain: Secondary | ICD-10-CM | POA: Insufficient documentation

## 2021-01-26 DIAGNOSIS — R112 Nausea with vomiting, unspecified: Secondary | ICD-10-CM | POA: Diagnosis not present

## 2021-01-26 LAB — URINALYSIS, ROUTINE W REFLEX MICROSCOPIC
Bilirubin Urine: NEGATIVE
Glucose, UA: NEGATIVE mg/dL
Hgb urine dipstick: NEGATIVE
Ketones, ur: NEGATIVE mg/dL
Nitrite: NEGATIVE
Protein, ur: NEGATIVE mg/dL
Specific Gravity, Urine: 1.025 (ref 1.005–1.030)
pH: 6 (ref 5.0–8.0)

## 2021-01-26 LAB — BASIC METABOLIC PANEL
Anion gap: 10 (ref 5–15)
BUN: 12 mg/dL (ref 6–20)
CO2: 20 mmol/L — ABNORMAL LOW (ref 22–32)
Calcium: 9.6 mg/dL (ref 8.9–10.3)
Chloride: 106 mmol/L (ref 98–111)
Creatinine, Ser: 0.77 mg/dL (ref 0.44–1.00)
GFR, Estimated: >60 mL/min (ref >60)
Glucose, Bld: 120 mg/dL — ABNORMAL HIGH (ref 70–99)
Potassium: 3.6 mmol/L (ref 3.5–5.1)
Sodium: 136 mmol/L (ref 135–145)

## 2021-01-26 LAB — CBC
HCT: 40.7 % (ref 36.0–46.0)
Hemoglobin: 13.8 g/dL (ref 12.0–15.0)
MCH: 28 pg (ref 26.0–34.0)
MCHC: 33.9 g/dL (ref 30.0–36.0)
MCV: 82.6 fL (ref 80.0–100.0)
Platelets: 338 10*3/uL (ref 150–400)
RBC: 4.93 MIL/uL (ref 3.87–5.11)
RDW: 12.3 % (ref 11.5–15.5)
WBC: 11.4 10*3/uL — ABNORMAL HIGH (ref 4.0–10.5)
nRBC: 0 % (ref 0.0–0.2)

## 2021-01-26 LAB — LIPASE, BLOOD: Lipase: 47 U/L (ref 11–51)

## 2021-01-26 LAB — PREGNANCY, URINE: Preg Test, Ur: NEGATIVE

## 2021-01-26 MED ORDER — ONDANSETRON HCL 4 MG PO TABS: 4.0000 mg | ORAL_TABLET | ORAL | 0 refills | Status: DC | PRN

## 2021-01-26 MED ORDER — CEPHALEXIN 500 MG PO CAPS
500.0000 mg | ORAL_CAPSULE | Freq: Once | ORAL | Status: AC
  Administered 2021-01-26: 500 mg via ORAL
  Filled 2021-01-26: qty 1

## 2021-01-26 MED ORDER — SODIUM CHLORIDE 0.9 % IV SOLN: 1.0000 g | Freq: Once | INTRAVENOUS | Status: DC

## 2021-01-26 MED ORDER — CEPHALEXIN 500 MG PO CAPS: 500.0000 mg | ORAL_CAPSULE | Freq: Two times a day (BID) | ORAL | 0 refills | Status: AC

## 2021-01-26 NOTE — ED Provider Notes (Signed)
[facility]-EMERGENCY DEPT [facility] Emergency Department Provider Note MRN:  [id number]  Arrival date & time: 01/26/21     Chief Complaint   Abdominal Pain   History of Present Illness   Barbara [name] is a 25 y.o. year-old female with no pertinent past medical history presenting to the ED with chief complaint of abdominal pain.   Location:  Left lower quadrant/left pelvic region Duration: 1 day Onset: Sudden Timing: Constant Description: Crampy Severity: Mild to moderate Exacerbating/Alleviating Factors: None Associated Symptoms: Nausea vomiting Pertinent Negatives: No fever, no vaginal bleeding or discharge  Additional History: Had a miscarriage about 3 weeks ago with passage of the fetus and sac. Marland Kitchen  Review of Systems  A complete 10 system review of systems was obtained and all systems are negative except as noted in the HPI and PMH.   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, also been evaluated by neurology   UTI (urinary tract infection)     Past Surgical History:  Procedure Laterality Date   NO PAST SURGERIES      Family History  Problem Relation Age of Onset   Depression Mother    Healthy Brother    Healthy Sister    Hypertension Maternal Grandmother    Depression Maternal Grandmother    Asthma Maternal Grandmother    Diabetes Maternal Grandmother    Heart disease Maternal Grandmother    Hearing loss Maternal Grandfather    COPD Maternal Grandfather     Social History   Socioeconomic History   Marital status: Legally Separated    Spouse name: Not on file   Number of children: 1   Years of education: 11   Highest education level: Not on file  Occupational History   Occupation: unemployed   Tobacco Use   Smoking status: Never   Smokeless tobacco: Never  Vaping Use   Vaping Use: Never used  Substance and Sexual Activity   Alcohol use: No    Alcohol/week: 0.0 standard drinks   Drug use: No   Sexual activity: Not Currently    Partners: Male  Other Topics Concern   Not on file  Social History Narrative   Patient does not drink caffeine.   Patient is right handed.   Social Determinants of Health   Financial Resource Strain: Not on file  Food Insecurity: Not on file  Transportation Needs: Not on file  Physical Activity: Not on file  Stress: Not on file  Social Connections: Not on file  Intimate Partner Violence: Not on file     Physical Exam   Vitals:   01/26/21 0324 01/26/21 0535  BP: (!) 143/84 (!) 135/95  Pulse: 87 80  Resp: 19 16  Temp: 98.7 F (37.1 C)   SpO2: 100% 100%    CONSTITUTIONAL: Well-appearing, NAD NEURO:  Alert and oriented x 3, no focal deficits EYES:  eyes equal and reactive ENT/NECK:  no LAD, no JVD CARDIO: Regular rate, well-perfused, normal S1 and S2 PULM:  CTAB no wheezing or rhonchi GI/GU:  normal bowel sounds, non-distended, non-tender MSK/SPINE:  No gross deformities, no edema SKIN:  no rash, atraumatic PSYCH:  Appropriate speech and behavior  *Additional and/or pertinent findings included  in MDM below  Diagnostic and Interventional Summary    EKG Interpretation  Date/Time:    Ventricular Rate:    PR Interval:    QRS Duration:   QT Interval:    QTC Calculation:   R Axis:     Text Interpretation:         Labs Reviewed  BASIC METABOLIC PANEL - Abnormal; Notable for the following components:      Result Value   CO2 20 (*)    Glucose, Bld 120 (*)    All other components within normal limits  CBC - Abnormal; Notable for the following components:   WBC 11.4 (*)    All other components within normal limits  LIPASE, BLOOD  PREGNANCY, URINE  URINALYSIS, ROUTINE W REFLEX MICROSCOPIC    No orders to display     Medications - No data to display   Procedures  /  Critical Care Procedures  ED Course and Medical Decision Making  I have reviewed the triage vital signs, the nursing notes, and pertinent available records from the EMR.  Listed above are laboratory and imaging tests that I personally ordered, reviewed, and interpreted and then considered in my medical decision making (see below for details).  Left lower pelvic pain, recent miscarriage, considering retained products of conception, ectopic pregnancy, ovarian cyst or torsion.  Sitting comfortably, normal vital signs, abdomen soft and nontender.  Awaiting urinalysis, hCG, will obtain ultrasound.  Signed out to oncoming provider at shift change.       Elmer Sow. Pilar Plate, MD [facility] [email or web address]  Final Clinical Impressions(s) / ED Diagnoses     ICD-10-CM   1. Left lower quadrant abdominal pain  R10.32       ED Discharge Orders     None        Discharge Instructions Discussed with and Provided to Patient:   Discharge Instructions   None       Sabas Sous, MD 01/26/21 [phone]

## 2021-01-26 NOTE — ED Provider Notes (Signed)
°  Provider Note MRN:  [id number]  Arrival date & time: 01/26/21    ED Course and Medical Decision Making  Assumed care from Dr Pilar Plate at shift change.  See not from prior team for complete details, in brief:  Presents with lower left pelvic cramping, feels like prior cramping pain last month when she had a miscarriage. Was supposed to get D&C but procedure was cancelled and now having worsened pain. Well appearing. N/V has improved. [place] pending. Retained products vs cyst vs ?torsion is on differential.   Plan per prior physician imaging/urine/reassess to dispo  Well appearing female, able to tolerate PO. No ongoing abdominal pain currently. VSS. Resting comfortably in bed.  Serious etiology was considered.  Patient found to have UTI, low specimen for pyelonephritis.  Vital signs and labs otherwise are stable.  Ultrasound without acute abnormalities.  Patient started on oral Keflex after sending urine culture.  Plan to discharge reports outpatient follow-up.  The patient improved significantly and was discharged in stable condition. Detailed discussions were had with the patient regarding current findings, and need for close f/u with PCP or on call doctor. The patient has been instructed to return immediately if the symptoms worsen in any way for re-evaluation. Patient verbalized understanding and is in agreement with current care plan. All questions answered prior to discharge.    Procedures  Final Clinical Impressions(s) / ED Diagnoses     ICD-10-CM   1. Urinary tract infection without hematuria, site unspecified  N39.0     2. Left lower quadrant abdominal pain  R10.32       ED Discharge Orders          Ordered    cephALEXin (KEFLEX) 500 MG capsule  2 times daily        01/26/21 0837    ondansetron (ZOFRAN) 4 MG tablet  Every 4 hours PRN        01/26/21 0837            Discharge Instructions   None      Sloan Leiter, DO 01/26/21 [phone]

## 2021-01-26 NOTE — ED Notes (Signed)
Pt states understanding of dc instructions, importance of follow up, and prescription. Pt denies questions or concerns upon dc. Pt declined wheelchair assistance upon dc. Pt ambulated out of ed w/ steady gait. No belongings left in room upon dc.  

## 2021-01-26 NOTE — ED Triage Notes (Addendum)
Patient from home with complaint of lower abdominal pain and left flank pain. Pt had miscarriage 1 week before thanksgiving and states she delivered the whole sac and was told she did not need D&C. Pt reports pain has been ongoing since the miscarriage. Pt also endorses N/V that began this morning.

## 2021-01-27 LAB — URINE CULTURE

## 2021-02-07 ENCOUNTER — Encounter (HOSPITAL_BASED_OUTPATIENT_CLINIC_OR_DEPARTMENT_OTHER): Payer: Self-pay

## 2021-02-07 ENCOUNTER — Ambulatory Visit (HOSPITAL_BASED_OUTPATIENT_CLINIC_OR_DEPARTMENT_OTHER): Admit: 2021-02-07 | Payer: BC Managed Care – PPO | Admitting: Obstetrics and Gynecology

## 2021-02-07 SURGERY — DILATATION & CURETTAGE/HYSTEROSCOPY WITH NOVASURE ABLATION
Anesthesia: Choice

## 2021-02-27 ENCOUNTER — Ambulatory Visit (HOSPITAL_COMMUNITY)
Admission: EM | Admit: 2021-02-27 | Discharge: 2021-02-28 | Disposition: A | Discharge status: Other Acute Inpatient Hospital | Payer: BC Managed Care – PPO | Attending: Student | Admitting: Student

## 2021-02-27 ENCOUNTER — Encounter (HOSPITAL_COMMUNITY): Payer: Self-pay | Admitting: Student

## 2021-02-27 DIAGNOSIS — Z9114 Patient's other noncompliance with medication regimen: Secondary | ICD-10-CM | POA: Insufficient documentation

## 2021-02-27 DIAGNOSIS — Z62810 Personal history of physical and sexual abuse in childhood: Secondary | ICD-10-CM | POA: Insufficient documentation

## 2021-02-27 DIAGNOSIS — X838XXA Intentional self-harm by other specified means, initial encounter: Secondary | ICD-10-CM | POA: Insufficient documentation

## 2021-02-27 DIAGNOSIS — Z9152 Personal history of nonsuicidal self-harm: Secondary | ICD-10-CM | POA: Insufficient documentation

## 2021-02-27 DIAGNOSIS — Z636 Dependent relative needing care at home: Secondary | ICD-10-CM | POA: Insufficient documentation

## 2021-02-27 DIAGNOSIS — T1491XA Suicide attempt, initial encounter: Secondary | ICD-10-CM | POA: Insufficient documentation

## 2021-02-27 DIAGNOSIS — Z20822 Contact with and (suspected) exposure to covid-19: Secondary | ICD-10-CM | POA: Insufficient documentation

## 2021-02-27 DIAGNOSIS — F419 Anxiety disorder, unspecified: Secondary | ICD-10-CM | POA: Insufficient documentation

## 2021-02-27 DIAGNOSIS — R450 Nervousness: Secondary | ICD-10-CM | POA: Insufficient documentation

## 2021-02-27 DIAGNOSIS — F1090 Alcohol use, unspecified, uncomplicated: Secondary | ICD-10-CM | POA: Insufficient documentation

## 2021-02-27 DIAGNOSIS — Z7289 Other problems related to lifestyle: Secondary | ICD-10-CM

## 2021-02-27 DIAGNOSIS — F332 Major depressive disorder, recurrent severe without psychotic features: Secondary | ICD-10-CM | POA: Diagnosis not present

## 2021-02-27 DIAGNOSIS — G4700 Insomnia, unspecified: Secondary | ICD-10-CM | POA: Insufficient documentation

## 2021-02-27 DIAGNOSIS — Z5982 Transportation insecurity: Secondary | ICD-10-CM | POA: Insufficient documentation

## 2021-02-27 DIAGNOSIS — Z818 Family history of other mental and behavioral disorders: Secondary | ICD-10-CM | POA: Insufficient documentation

## 2021-02-27 DIAGNOSIS — G47419 Narcolepsy without cataplexy: Secondary | ICD-10-CM | POA: Insufficient documentation

## 2021-02-27 DIAGNOSIS — Z79899 Other long term (current) drug therapy: Secondary | ICD-10-CM | POA: Insufficient documentation

## 2021-02-27 DIAGNOSIS — Z635 Disruption of family by separation and divorce: Secondary | ICD-10-CM | POA: Insufficient documentation

## 2021-02-27 DIAGNOSIS — Z62811 Personal history of psychological abuse in childhood: Secondary | ICD-10-CM | POA: Insufficient documentation

## 2021-02-27 MED ORDER — MAGNESIUM HYDROXIDE 400 MG/5ML PO SUSP: 30.0000 mL | Freq: Every day | ORAL | Status: DC | PRN

## 2021-02-27 MED ORDER — ALUM & MAG HYDROXIDE-SIMETH 200-200-20 MG/5ML PO SUSP: 30.0000 mL | ORAL | Status: DC | PRN

## 2021-02-27 MED ORDER — SERTRALINE HCL 100 MG PO TABS: 150.0000 mg | ORAL_TABLET | Freq: Every day | ORAL | Status: DC

## 2021-02-27 NOTE — BH Assessment (Addendum)
Comprehensive Clinical Assessment (CCA) Note  02/28/2021 Barbara [name] [id number]  Disposition: Margorie John, PA-C recommends inpatient treatment. Per Larose Kells, RN no available beds at [facility], disposition to seek placement.   [facility] from 02/27/2021 in [facility] ED from 01/26/2021 in [facility]PT ED to Hosp-Admission (Discharged) from 11/22/2020 in [facility] 1S Maternity Assessment Unit  C-SSRS RISK CATEGORY High Risk No Risk No Risk       The patient demonstrates the following risk factors for suicide: Chronic risk factors for suicide include: psychiatric disorder of Major Depressive Disorder, recurrent, severe without psychotic features . Acute risk factors for suicide include: family or marital conflict and Initially, pt considered her actions was a suicide attempt  . Protective factors for this patient include:  None . Considering these factors, the overall suicide risk at this point appears to be high. Patient is not appropriate for outpatient follow up.  Barbara [name] is a 26 year old female who presents voluntary and unaccompanied to [place]. Per pt,while in her room she wrapped duct tape all around her throat for about 10 minutes until her grandmother found her, pt's boyfriend called her mother who then called police. Per pt, her body went limp before she wrapped the duct tape around her throat, due to her diagnosis of Cataplexy. Pt reported, she couldn't do this any more. Pt denies, injuries to her neck. Pt discussed stressors of finding her husband in bed with another woman last year, she discussed taking care of everyone but herself. Initially, pt considered her actions was a suicide attempt then said she wasn't sure of her intentions. Pt discussed how she was treated differently as a child and was separated from her family. Pt reports, when she was 26 years old she learned her step-father was not her biological father.  Pt reports, in 2019 he father was murder; they had a rocky relationship. Pt currently denies, SI, HI, AVH, self-injurious behaviors and access to weapons.   Pt reports, drinking once in a few weekends. Pt reports, she drank three Mad Dogs on Friday. Pt reports, she's prescribed Zoloft for her depression but she feels it's not working. Pt denies, previous inpatient admissions.   Pt presents very tearful with normal speech. Pt's mood, affect was depressed. Pt's insight was fair. Pt's judgement is poor. Pt reports, she wants to go home and try Outpatient treatment however clinician and PA-C encouraged the pt to consider inpatient based on her presentation that's the recommendation, if not she will be IVC'd. Pt agreed to go inpatient on a voluntary basis.   Diagnosis: Major Depressive Disorder, recurrent, severe without psychotic features.   *Pt consented for staff to contact her grandmother. Clinician attempted to call pt's grandmother Beryle Quant, [phone]) to obtain additional information. Clinic an attempted to call however phone continued to ring and received the follow message, "call can not be completed as dialed."*  Chief Complaint: No chief complaint on file.  Visit Diagnosis:     CCA Screening, Triage and Referral (STR)  Patient Reported Information How did you hear about [place]? Legal System  What Is the Reason for Your Visit/Call Today? Pt reports, she wrapped duct tape around her throat initially the pt considered her actions as a suicide attempt then said she wasn't sure of her intentions. Pt is very depressed. Pt reports, she can contract for safety.  How Long Has This Been Causing You Problems? > than 6 months  What Do You  Feel Would Help You the Most Today? Treatment for Depression or other mood problem; Medication(s)   Have You Recently Had Any Thoughts About Hurting Yourself? Yes  Are You Planning to Commit Suicide/Harm Yourself At This time? No   Have you Recently Had  Thoughts About [place]? No  Are You Planning to Harm Someone at This Time? No  Explanation: No data recorded  Have You Used Any Alcohol or Drugs in the Past 24 Hours? No  How Long Ago Did You Use Drugs or Alcohol? No data recorded What Did You Use and How Much? No data recorded  Do You Currently Have a Therapist/Psychiatrist? No  Name of Therapist/Psychiatrist: No data recorded  Have You Been Recently Discharged From Any Office Practice or Programs? No data recorded Explanation of Discharge From Practice/Program: No data recorded    CCA Screening Triage Referral Assessment Type of Contact: Face-to-Face  Telemedicine Service Delivery:   Is this Initial or Reassessment? No data recorded Date Telepsych consult ordered in CHL:  No data recorded Time Telepsych consult ordered in CHL:  No data recorded Location of Assessment: [facility] Assessment Services  Provider Location: [facility] Assessment Services   Collateral Involvement: Beryle Quant, grandmother, [phone]. Clinican attempted to call however phone continued to ring and recieved the follow message, "call can not be completed as dialed."   Does Patient Have a [place]? No data recorded Name and Contact of Legal Guardian: No data recorded If Minor and Not Living with Parent(s), Who has Custody? No data recorded Is CPS involved or ever been involved? No data recorded Is APS involved or ever been involved? No data recorded  Patient Determined To Be At Risk for Harm To Self or Others Based on Review of Patient Reported Information or Presenting Complaint? Yes, for Self-Harm  Method: No data recorded Availability of Means: No data recorded Intent: No data recorded Notification Required: No data recorded Additional Information for Danger to Others Potential: No data recorded Additional Comments for Danger to Others Potential: No data recorded Are There Guns or Other Weapons in Your Home? No  data recorded Types of Guns/Weapons: No data recorded Are These Weapons Safely Secured?                            No data recorded Who Could Verify You Are Able To Have These Secured: No data recorded Do You Have any Outstanding Charges, Pending Court Dates, Parole/Probation? No data recorded Contacted To Inform of Risk of Harm To Self or Others: Family/Significant Other:; Law Enforcement    Does Patient Present under Involuntary Commitment? No  IVC Papers Initial File Date: No data recorded  [place] of Residence: [place]   Patient Currently Receiving the Following Services: Not Receiving Services   Determination of Need: Emergent (2 hours)   Options For Referral: Inpatient Hospitalization; Facility-Based Crisis; Medication Management; Outpatient Therapy; [facility]     CCA Biopsychosocial Patient Reported Schizophrenia/Schizoaffective Diagnosis in Past: No data recorded  Strengths: No data recorded  Mental Health Symptoms Depression:   Irritability; Worthlessness; Hopelessness; Fatigue; Difficulty Concentrating; Weight gain/loss; Tearfulness; Change in energy/activity; Increase/decrease in appetite; Sleep (too much or little)   Duration of Depressive symptoms:  Duration of Depressive Symptoms: Greater than two weeks   Mania:  No data recorded  Anxiety:    Tension; Worrying; Fatigue; Irritability; Restlessness; Difficulty concentrating   Psychosis:   None   Duration of Psychotic symptoms:  Trauma:   None   Obsessions:  No data recorded  Compulsions:   None   Inattention:   Forgetful   Hyperactivity/Impulsivity:   None   Oppositional/Defiant Behaviors:   None   Emotional Irregularity:   Mood lability   Other Mood/Personality Symptoms:  No data recorded   Mental Status Exam Appearance and self-care  Stature:  No data recorded  Weight:   Overweight   Clothing:   Casual   Grooming:   Normal   Cosmetic use:  No data recorded   Posture/gait:   Normal   Motor activity:   Not Remarkable   Sensorium  Attention:   Persistent   Concentration:   Normal   Orientation:   X5   Recall/memory:   Normal   Affect and Mood  Affect:   Depressed   Mood:   Depressed   Relating  Eye contact:   Normal   Facial expression:   Depressed   Attitude toward examiner:   Cooperative   Thought and Language  Speech flow:  Normal   Thought content:   Appropriate to Mood and Circumstances   Preoccupation:   None   Hallucinations:   None   Organization:  No data recorded  [employer] of Knowledge:   Fair   Intelligence:  No data recorded  Abstraction:  No data recorded  Judgement:   Poor   Reality Testing:  No data recorded  Insight:   Fair   Decision Making:   Impulsive   Social Functioning  Social Maturity:   Impulsive   Social Judgement:   Heedless   Stress  Stressors:   Family conflict   Coping Ability:   Overwhelmed   Skill Deficits:   Self-control; Decision making   Supports:   Family     Religion: Religion/Spirituality Are You A Religious Person?: No  Leisure/Recreation: Leisure / Recreation Do You Have Hobbies?: No  Exercise/Diet: Exercise/Diet Do You Follow a Special Diet?: No (Pt reports, she has been eating more.) Do You Have Any Trouble Sleeping?: Yes Explanation of Sleeping Difficulties: Pt reports, her sleep is on and off and has Narcolepsy.   CCA Employment/Education Employment/Work Situation: Employment / Work Situation Employment Situation: Employed ([employer] Work Stressors: Pt reports, no stressors.  Education: Education Is Patient Currently Attending School?: No Last Grade Completed: 11 Did You Attend College?: No   CCA Family/Childhood History Family and Relationship History: Family history Marital status: Married Number of Years Married: 8 What types of issues is patient dealing with in the  relationship?: Per pt, in April 2022 she found her husband in the bed with a woman that was trying to be her friend and recently hung out with her. Additional relationship information: Per pt, she rotates weeks with her husband of who has the kids. Does patient have children?: Yes How many children?: 2 How is patient's relationship with their children?: Per pt, she has an 33 year old son who has Autism and ADHD and a 47 year old.  Childhood History:  Childhood History By whom was/is the patient raised?: Mother, Mother/father and step-parent Did patient suffer any verbal/emotional/physical/sexual abuse as a child?: No Did patient suffer from severe childhood neglect?:  (Pt reports, she was always treated differently by her family and her mother did not correct it.) Has patient ever been sexually abused/assaulted/raped as an adolescent or adult?: No Witnessed domestic violence?: No Has patient been affected by domestic violence as an adult?:  (NA)  Child/Adolescent Assessment:  CCA Substance Use Alcohol/Drug Use: Alcohol / Drug Use Pain Medications: See MAR Prescriptions: See MAR Over the Counter: See MAR History of alcohol / drug use?: No history of alcohol / drug abuse     ASAM's:  Six Dimensions of Multidimensional Assessment  Dimension 1:  Acute Intoxication and/or Withdrawal Potential:      Dimension 2:  Biomedical Conditions and Complications:      Dimension 3:  Emotional, Behavioral, or Cognitive Conditions and Complications:     Dimension 4:  Readiness to Change:     Dimension 5:  Relapse, Continued use, or Continued Problem Potential:     Dimension 6:  Recovery/Living Environment:     ASAM Severity Score:    ASAM Recommended Level of Treatment:     Substance use Disorder (SUD)    Recommendations for Services/Supports/Treatments: Recommendations for Services/Supports/Treatments Recommendations For Services/Supports/Treatments: Inpatient  Hospitalization  Discharge Disposition:    DSM5 Diagnoses: Patient Active Problem List   Diagnosis Date Noted   BMI 30s 12/28/2020   Supervision of other normal pregnancy, antepartum 12/28/2020   Missed abortion 12/28/2020   History of gestational hypertension 05/01/2017   History of gestational diabetes mellitus (GDM) 03/14/2017   Family history of cleft palate 02/05/2017   Narcolepsy 09/19/2016   Depressed mood with postpartum onset 01/18/2014     Referrals to Alternative Service(s): Referred to Alternative Service(s):   Place:   Date:   Time:    Referred to Alternative Service(s):   Place:   Date:   Time:    Referred to Alternative Service(s):   Place:   Date:   Time:    Referred to Alternative Service(s):   Place:   Date:   Time:     Vertell Novak, [facility] Comprehensive Clinical Assessment (CCA) Screening, Triage and Referral Note  02/28/2021 Barbara [name] [id number]  Chief Complaint: No chief complaint on file.  Visit Diagnosis:   Patient Reported Information How did you hear about [place]? Legal System  What Is the Reason for Your Visit/Call Today? Pt reports, she wrapped duct tape around her throat initially the pt considered her actions as a suicide attempt then said she wasn't sure of her intentions. Pt is very depressed. Pt reports, she can contract for safety.  How Long Has This Been Causing You Problems? > than 6 months  What Do You Feel Would Help You the Most Today? Treatment for Depression or other mood problem; Medication(s)   Have You Recently Had Any Thoughts About Hurting Yourself? Yes  Are You Planning to Commit Suicide/Harm Yourself At This time? No   Have you Recently Had Thoughts About [place]? No  Are You Planning to Harm Someone at This Time? No  Explanation: No data recorded  Have You Used Any Alcohol or Drugs in the Past 24 Hours? No  How Long Ago Did You Use Drugs or Alcohol? No data recorded What Did You Use and How  Much? No data recorded  Do You Currently Have a Therapist/Psychiatrist? No  Name of Therapist/Psychiatrist: No data recorded  Have You Been Recently Discharged From Any Office Practice or Programs? No data recorded Explanation of Discharge From Practice/Program: No data recorded   CCA Screening Triage Referral Assessment Type of Contact: Face-to-Face  Telemedicine Service Delivery:   Is this Initial or Reassessment? No data recorded Date Telepsych consult ordered in CHL:  No data recorded Time Telepsych consult ordered in CHL:  No data recorded Location of Assessment: [facility] Assessment Services  Provider Location: [facility] Assessment Services   Collateral Involvement: Beryle Quant, grandmother, [phone]. Clinican attempted to call however phone continued to ring and recieved the follow message, "call can not be completed as dialed."   Does Patient Have a [place]? No data recorded Name and Contact of Legal Guardian: No data recorded If Minor and Not Living with Parent(s), Who has Custody? No data recorded Is CPS involved or ever been involved? No data recorded Is APS involved or ever been involved? No data recorded  Patient Determined To Be At Risk for Harm To Self or Others Based on Review of Patient Reported Information or Presenting Complaint? Yes, for Self-Harm  Method: No data recorded Availability of Means: No data recorded Intent: No data recorded Notification Required: No data recorded Additional Information for Danger to Others Potential: No data recorded Additional Comments for Danger to Others Potential: No data recorded Are There Guns or Other Weapons in Your Home? No data recorded Types of Guns/Weapons: No data recorded Are These Weapons Safely Secured?                            No data recorded Who Could Verify You Are Able To Have These Secured: No data recorded Do You Have any Outstanding Charges, Pending Court Dates, Parole/Probation?  No data recorded Contacted To Inform of Risk of Harm To Self or Others: Family/Significant Other:; Law Enforcement   Does Patient Present under Involuntary Commitment? No  IVC Papers Initial File Date: No data recorded  [place] of Residence: [place]   Patient Currently Receiving the Following Services: Not Receiving Services   Determination of Need: Emergent (2 hours)   Options For Referral: Inpatient Hospitalization; Facility-Based Crisis; Medication Management; Outpatient Therapy; [facility]   Discharge Disposition:     Vertell Novak, [place], [place], [facility], [facility] Triage Specialist [phone]

## 2021-02-27 NOTE — Progress Notes (Signed)
°   02/27/21 2323  Patient Reported Information  How Did You Hear About [place]? Legal System  What Is the Reason for Your Visit/Call Today? Pt reports, she wrapped duct tape around her throat initally pt considered her actions as a suicide attempt then said she wasn't sure of her intentions. Pt is very depressed. Pt reports, she can contract for safety.  How Long Has This Been Causing You Problems? > than 6 months  What Do You Feel Would Help You the Most Today? Treatment for Depression or other mood problem;Medication(s)  Have You Recently Had Any Thoughts About Hurting Yourself? Yes  Are You Planning to Commit Suicide/Harm Yourself At This time? No  Have you Recently Had Thoughts About Hurting Someone Karolee Ohs? No  Are You Planning To Harm Someone At This Time? No  Have You Used Any Alcohol or Drugs in the Past 24 Hours? No  Do You Currently Have a Therapist/Psychiatrist? No  CCA Screening Triage Referral Assessment  Type of Contact Face-to-Face  Location of Assessment [facility] Assessment Services  Provider location [facility] Assessment Services  Collateral Involvement Lesia Sago, grandmother, [phone]. Clinican attempted to call however phone continued to ring and recieved the follow message, "call can not be completed as dailed."  Patient Determined To Be At Risk for Harm To Self or Others Based on Review of Patient Reported Information or Presenting Complaint? Yes, for Self-Harm  Contacted To Inform of Risk of Harm To Self or Others: Family/Significant Other:;Law Enforcement  Does Patient Present under Involuntary Commitment? No  [place] of Residence [place]  Patient Currently Receiving the Following Services: Not Receiving Services  Determination of Need Emergent (2 hours)  Options For Referral Inpatient Hospitalization;Facility-Based Crisis;Medication Management;Outpatient Therapy;[facility]   Determination of need: Emergent.   Redmond Pulling, MS, [facility], [facility] Triage  Specialist [phone]

## 2021-02-28 ENCOUNTER — Encounter: Payer: Self-pay | Admitting: Psychiatry

## 2021-02-28 ENCOUNTER — Inpatient Hospital Stay
Admission: AD | Admit: 2021-02-28 | Discharge: 2021-03-02 | DRG: 885 | Disposition: A | Discharge status: Home / Self Care | Payer: BC Managed Care – PPO | Source: Intra-hospital | Attending: Psychiatry | Admitting: Psychiatry

## 2021-02-28 ENCOUNTER — Other Ambulatory Visit: Payer: Self-pay

## 2021-02-28 DIAGNOSIS — X838XXA Intentional self-harm by other specified means, initial encounter: Secondary | ICD-10-CM | POA: Diagnosis not present

## 2021-02-28 DIAGNOSIS — R450 Nervousness: Secondary | ICD-10-CM | POA: Diagnosis not present

## 2021-02-28 DIAGNOSIS — Z20822 Contact with and (suspected) exposure to covid-19: Secondary | ICD-10-CM | POA: Diagnosis present

## 2021-02-28 DIAGNOSIS — Z9152 Personal history of nonsuicidal self-harm: Secondary | ICD-10-CM | POA: Diagnosis not present

## 2021-02-28 DIAGNOSIS — Z635 Disruption of family by separation and divorce: Secondary | ICD-10-CM | POA: Diagnosis not present

## 2021-02-28 DIAGNOSIS — Z818 Family history of other mental and behavioral disorders: Secondary | ICD-10-CM

## 2021-02-28 DIAGNOSIS — F332 Major depressive disorder, recurrent severe without psychotic features: Secondary | ICD-10-CM | POA: Diagnosis present

## 2021-02-28 DIAGNOSIS — F1090 Alcohol use, unspecified, uncomplicated: Secondary | ICD-10-CM | POA: Diagnosis not present

## 2021-02-28 DIAGNOSIS — Z79899 Other long term (current) drug therapy: Secondary | ICD-10-CM

## 2021-02-28 DIAGNOSIS — G47419 Narcolepsy without cataplexy: Secondary | ICD-10-CM | POA: Diagnosis present

## 2021-02-28 DIAGNOSIS — T1491XA Suicide attempt, initial encounter: Secondary | ICD-10-CM | POA: Diagnosis present

## 2021-02-28 DIAGNOSIS — Z636 Dependent relative needing care at home: Secondary | ICD-10-CM | POA: Diagnosis not present

## 2021-02-28 DIAGNOSIS — Z5982 Transportation insecurity: Secondary | ICD-10-CM | POA: Diagnosis not present

## 2021-02-28 DIAGNOSIS — F419 Anxiety disorder, unspecified: Secondary | ICD-10-CM | POA: Diagnosis not present

## 2021-02-28 DIAGNOSIS — Z9114 Patient's other noncompliance with medication regimen: Secondary | ICD-10-CM | POA: Diagnosis not present

## 2021-02-28 DIAGNOSIS — G4700 Insomnia, unspecified: Secondary | ICD-10-CM | POA: Diagnosis not present

## 2021-02-28 DIAGNOSIS — Z62810 Personal history of physical and sexual abuse in childhood: Secondary | ICD-10-CM | POA: Diagnosis not present

## 2021-02-28 DIAGNOSIS — Z62811 Personal history of psychological abuse in childhood: Secondary | ICD-10-CM | POA: Diagnosis not present

## 2021-02-28 LAB — COMPREHENSIVE METABOLIC PANEL
ALT: 67 U/L — ABNORMAL HIGH (ref 0–44)
AST: 32 U/L (ref 15–41)
Albumin: 4.1 g/dL (ref 3.5–5.0)
Alkaline Phosphatase: 55 U/L (ref 38–126)
Anion gap: 10 (ref 5–15)
BUN: 11 mg/dL (ref 6–20)
CO2: 24 mmol/L (ref 22–32)
Calcium: 9.8 mg/dL (ref 8.9–10.3)
Chloride: 107 mmol/L (ref 98–111)
Creatinine, Ser: 0.78 mg/dL (ref 0.44–1.00)
GFR, Estimated: >60 mL/min (ref >60)
Glucose, Bld: 91 mg/dL (ref 70–99)
Potassium: 4.2 mmol/L (ref 3.5–5.1)
Sodium: 141 mmol/L (ref 135–145)
Total Bilirubin: 0.5 mg/dL (ref 0.3–1.2)
Total Protein: 7.9 g/dL (ref 6.5–8.1)

## 2021-02-28 LAB — CBC WITH DIFFERENTIAL/PLATELET
Abs Immature Granulocytes: 0.05 10*3/uL (ref 0.00–0.07)
Basophils Absolute: 0.1 10*3/uL (ref 0.0–0.1)
Basophils Relative: 1 %
Eosinophils Absolute: 0.2 10*3/uL (ref 0.0–0.5)
Eosinophils Relative: 1 %
HCT: 39.1 % (ref 36.0–46.0)
Hemoglobin: 13.1 g/dL (ref 12.0–15.0)
Immature Granulocytes: 1 %
Lymphocytes Relative: 33 %
Lymphs Abs: 3.6 10*3/uL (ref 0.7–4.0)
MCH: 27.8 pg (ref 26.0–34.0)
MCHC: 33.5 g/dL (ref 30.0–36.0)
MCV: 82.8 fL (ref 80.0–100.0)
Monocytes Absolute: 0.7 10*3/uL (ref 0.1–1.0)
Monocytes Relative: 7 %
Neutro Abs: 6.4 10*3/uL (ref 1.7–7.7)
Neutrophils Relative %: 57 %
Platelets: 293 10*3/uL (ref 150–400)
RBC: 4.72 MIL/uL (ref 3.87–5.11)
RDW: 12.4 % (ref 11.5–15.5)
WBC: 11.1 10*3/uL — ABNORMAL HIGH (ref 4.0–10.5)
nRBC: 0 % (ref 0.0–0.2)

## 2021-02-28 LAB — POCT PREGNANCY, URINE: Preg Test, Ur: NEGATIVE

## 2021-02-28 LAB — POCT URINE DRUG SCREEN - MANUAL ENTRY (I-SCREEN)
POC Amphetamine UR: NOT DETECTED
POC Buprenorphine (BUP): NOT DETECTED
POC Cocaine UR: NOT DETECTED
POC Marijuana UR: POSITIVE — AB
POC Methadone UR: NOT DETECTED
POC Methamphetamine UR: NOT DETECTED
POC Morphine: NOT DETECTED
POC Oxazepam (BZO): NOT DETECTED
POC Oxycodone UR: NOT DETECTED
POC Secobarbital (BAR): NOT DETECTED

## 2021-02-28 LAB — ETHANOL: Alcohol, Ethyl (B): <10 mg/dL (ref <10)

## 2021-02-28 LAB — LIPID PANEL
Cholesterol: 229 mg/dL — ABNORMAL HIGH (ref 0–200)
HDL: 45 mg/dL (ref >40)
LDL Cholesterol: 141 mg/dL — ABNORMAL HIGH (ref 0–99)
Total CHOL/HDL Ratio: 5.1 RATIO
Triglycerides: 214 mg/dL — ABNORMAL HIGH (ref <150)
VLDL: 43 mg/dL — ABNORMAL HIGH (ref 0–40)

## 2021-02-28 LAB — RESP PANEL BY RT-PCR (FLU A&B, COVID) ARPGX2
Influenza A by PCR: NEGATIVE
Influenza B by PCR: NEGATIVE
SARS Coronavirus 2 by RT PCR: NEGATIVE

## 2021-02-28 LAB — TSH: TSH: 2.581 u[IU]/mL (ref 0.350–4.500)

## 2021-02-28 LAB — HEMOGLOBIN A1C
Hgb A1c MFr Bld: 5 % (ref 4.8–5.6)
Mean Plasma Glucose: 96.8 mg/dL

## 2021-02-28 LAB — POC SARS CORONAVIRUS 2 AG: SARSCOV2ONAVIRUS 2 AG: NEGATIVE

## 2021-02-28 LAB — POC SARS CORONAVIRUS 2 AG -  ED: SARS Coronavirus 2 Ag: NEGATIVE

## 2021-02-28 MED ORDER — SERTRALINE HCL 25 MG PO TABS
150.0000 mg | ORAL_TABLET | Freq: Every day | ORAL | Status: DC
  Administered 2021-02-28: 150 mg via ORAL
  Filled 2021-02-28: qty 2

## 2021-02-28 MED ORDER — HYDROXYZINE HCL 25 MG PO TABS
25.0000 mg | ORAL_TABLET | Freq: Three times a day (TID) | ORAL | Status: DC | PRN
  Administered 2021-02-28: 25 mg via ORAL
  Filled 2021-02-28: qty 1

## 2021-02-28 MED ORDER — TRAZODONE HCL 100 MG PO TABS
100.0000 mg | ORAL_TABLET | Freq: Every evening | ORAL | Status: DC | PRN
  Administered 2021-02-28 – 2021-03-01 (×2): 100 mg via ORAL
  Filled 2021-02-28 (×2): qty 1

## 2021-02-28 MED ORDER — MAGNESIUM HYDROXIDE 400 MG/5ML PO SUSP: 30.0000 mL | Freq: Every day | ORAL | Status: DC | PRN

## 2021-02-28 MED ORDER — ALUM & MAG HYDROXIDE-SIMETH 200-200-20 MG/5ML PO SUSP: 30.0000 mL | ORAL | Status: DC | PRN

## 2021-02-28 MED ORDER — HYDROXYZINE HCL 25 MG PO TABS
25.0000 mg | ORAL_TABLET | Freq: Three times a day (TID) | ORAL | Status: DC | PRN
  Filled 2021-02-28: qty 1

## 2021-02-28 MED ORDER — ACETAMINOPHEN 325 MG PO TABS: 650.0000 mg | ORAL_TABLET | Freq: Four times a day (QID) | ORAL | Status: DC | PRN

## 2021-02-28 NOTE — ED Notes (Signed)
Pt transferred to [facility] via safe transport. Tearful but verbalized agreement to transfer. Safety maintained.

## 2021-02-28 NOTE — ED Notes (Signed)
Pt laying in bed tearful. Pt states, "I feel like a failure. I walked in on my husband and a friend of mine last April. We split up and now, he moved her into our house. Plus, my family treat me like crap and I'm just tired of it all. I just don't know where to turn". Support provided and encouraged pt to stay compliant with recommended OP therapy once discharged from [facility] facility. Pt verbalized understanding and agreement. Endorse passive SI no plan at current. Denies HI/AVH. Informed pt to notify staff with any needs or concerns. Safety maintained.

## 2021-02-28 NOTE — Progress Notes (Signed)
Patient calm and compliant during assessment. Pt endorses depression, denies SI/HI/AVH and anxiety. Patient observed interacting appropriately with staff and peers on the unit. Pt compliant with medication administration per MD orders. Pt given education, support, and encouragement to be active in her treatment plan. Pt being monitored Q 15 minutes for safety per unit protocol. Pt remains safe on the unit

## 2021-02-28 NOTE — Progress Notes (Signed)
Admission Note Patient admitted to [facility] voluntarily. 26 year old female admitted with Major Depressive Disorder. Reports history of Narcolepsy and Cataplexy. Patient is triggered emotionally and is aware of when the onset of the cataplexy comes. Pt was anxious and tearful during admission yet pleasant. Denies SI/HI/AVH. Admission plan of care reviewed with pt, consent signed.  Personal belongings/skin assessment completed.   No contraband found.  Patient oriented to the unit, staff and room.  Routine safety checks initiated.  Verbalizes understanding of unit rules/protocols.   Patient is presently safe on the unit. No unsafe behaviors noted.  Q 15 minute safety checks maintained per unit protocol.

## 2021-02-28 NOTE — Tx Team (Signed)
Initial Treatment Plan 02/28/2021 5:57 PM Barbara [name] [id number]    PATIENT STRESSORS: Health problems   Marital or family conflict   Medication change or noncompliance     PATIENT STRENGTHS: Average or above average intelligence  Personnel officer means  Work skills    PATIENT IDENTIFIED PROBLEMS: Suicidal ideation  No support system                   DISCHARGE CRITERIA:  Ability to meet basic life and health needs Adequate post-discharge living arrangements Medical problems require only outpatient monitoring Motivation to continue treatment in a less acute level of care Verbal commitment to aftercare and medication compliance  PRELIMINARY DISCHARGE PLAN: Attend aftercare/continuing care group Return to previous living arrangement  PATIENT/FAMILY INVOLVEMENT: This treatment plan has been presented to and reviewed with the patient, Barbara [name], and/or family member,  The patient and family have been given the opportunity to ask questions and make suggestions.  Leonarda Salon, RN 02/28/2021, 5:57 PM

## 2021-02-28 NOTE — ED Notes (Signed)
Pt A&O x 4, wrapped duct tape around her neck, relates to family stressors.  Denies HI or AVH. Pt calm & cooperative.  Monitoring for safety.

## 2021-02-28 NOTE — ED Notes (Signed)
Pt sleeping at present, no distress noted.  Monitoring for safety. 

## 2021-02-28 NOTE — ED Provider Notes (Addendum)
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 - 126 U/L Final   Total Bilirubin 02/28/2021 0.5  0.3 - 1.2 mg/dL Final   GFR, Estimated 02/28/2021 >60  >60 mL/min Final   Comment: (NOTE) Calculated using the CKD-EPI Creatinine Equation (2021)    Anion gap 02/28/2021 10  5 - 15 Final   Performed at [facility], [place] [address], [place], [place] [address]   Alcohol, Ethyl (B) 02/28/2021 <10  <10 mg/dL Final   Comment: (NOTE) Lowest detectable limit for serum alcohol is 10 mg/dL.  For medical purposes only. Performed at [facility], [place] [address], [place], [place] [address]    Hgb A1c MFr Bld 02/28/2021 5.0  4.8 - 5.6 % Final   Comment: (NOTE) Pre diabetes:          5.7%-6.4%  Diabetes:              >6.4%  Glycemic control for   <7.0% adults with diabetes    Mean Plasma Glucose 02/28/2021  96.8  mg/dL Final   Performed at [facility], [place] [address] [place], [place] [address]   Cholesterol 02/28/2021 229 (H)  0 - 200 mg/dL Final   Triglycerides 02/28/2021 214 (H)  <150 mg/dL Final   HDL 02/28/2021 45  >40 mg/dL Final   Total CHOL/HDL Ratio 02/28/2021 5.1  RATIO Final   VLDL 02/28/2021 43 (H)  0 - 40 mg/dL Final   LDL Cholesterol 02/28/2021 141 (H)  0 - 99 mg/dL Final   Comment:        Total Cholesterol/HDL:CHD Risk Coronary Heart Disease Risk Table                     Men   Women  1/2 Average Risk   3.4   3.3  Average Risk       5.0   4.4  2 X Average Risk   9.6   7.1  3 X Average Risk  23.4   11.0        Use the calculated Patient Ratio above and the CHD Risk Table to determine the patient's CHD Risk.        ATP III CLASSIFICATION (LDL):  <100     mg/dL   Optimal  100-129  mg/dL   Near or Above                    Optimal  130-159  mg/dL   Borderline  160-189  mg/dL   High  >190     mg/dL   Very High Performed at [place] [address], [place], [place] [address]    TSH 02/28/2021 2.581  0.350 - 4.500 uIU/mL Final   Comment: Performed by a 3rd Generation assay with a functional sensitivity of <=0.01 uIU/mL. Performed at [facility], [place] [address] [place], [place] [address]    POC Amphetamine UR 02/28/2021 None Detected  NONE DETECTED (Cut Off Level 1000 ng/mL) Final   POC Secobarbital (BAR) 02/28/2021 None Detected  NONE DETECTED (Cut Off Level 300 ng/mL) Final   POC Buprenorphine (BUP) 02/28/2021 None Detected  NONE DETECTED (Cut Off Level 10 ng/mL) Final   POC Oxazepam (BZO) 02/28/2021 None Detected  NONE DETECTED (Cut Off Level 300 ng/mL) Final   POC Cocaine UR 02/28/2021 None Detected  NONE DETECTED (Cut Off  Level 300 ng/mL) Final   POC Methamphetamine UR 02/28/2021 None Detected  NONE DETECTED (Cut Off Level 1000 ng/mL) Final   POC Morphine 02/28/2021 None Detected  NONE DETECTED (Cut Off Level 300 ng/mL) Final   POC Oxycodone  UR 02/28/2021 None Detected  NONE DETECTED (Cut Off Level 100 ng/mL) Final   POC Methadone UR 02/28/2021 None Detected  NONE DETECTED (Cut Off Level 300 ng/mL) Final   POC Marijuana UR 02/28/2021 Positive (A)  NONE DETECTED (Cut Off Level 50 ng/mL) Final   SARSCOV2ONAVIRUS 2 AG 02/28/2021 NEGATIVE  NEGATIVE Final   Comment: (NOTE) SARS-CoV-2 antigen NOT DETECTED.   Negative results are presumptive.  Negative results do not preclude SARS-CoV-2 infection and should not be used as the sole basis for treatment or other patient management decisions, including infection  control decisions, particularly in the presence of clinical signs and  symptoms consistent with COVID-19, or in those who have been in contact with the virus.  Negative results must be combined with clinical observations, patient history, and epidemiological information. The expected result is Negative.  Fact Sheet for Patients: HandmadeRecipes.com.cy  Fact Sheet for Healthcare Providers: FuneralLife.at  This test is not yet approved or cleared by the [place] FDA and  has been authorized for detection and/or diagnosis of SARS-CoV-2 by FDA under an Emergency Use Authorization (EUA).  This EUA will remain in effect (meaning this test can be used) for the duration of  the COV                          ID-19 declaration under Section 564(b)(1) of the Act, 21 U.S.C. section 360bbb-3(b)(1), unless the authorization is terminated or revoked sooner.     Preg Test, Ur 02/28/2021 NEGATIVE  NEGATIVE Final   Comment:        THE SENSITIVITY OF THIS METHODOLOGY IS >24 mIU/mL   Admission on 01/26/2021, Discharged on 01/26/2021  Component Date Value Ref Range Status   Sodium 01/26/2021 136  135 - 145 mmol/L Final   Potassium 01/26/2021 3.6  3.5 - 5.1 mmol/L Final   Chloride 01/26/2021 106  98 - 111 mmol/L Final   CO2 01/26/2021 20 (L)  22 - 32 mmol/L Final   Glucose, Bld  01/26/2021 120 (H)  70 - 99 mg/dL Final   Glucose reference range applies only to samples taken after fasting for at least 8 hours.   BUN 01/26/2021 12  6 - 20 mg/dL Final   Creatinine, Ser 01/26/2021 0.77  0.44 - 1.00 mg/dL Final   Calcium 01/26/2021 9.6  8.9 - 10.3 mg/dL Final   GFR, Estimated 01/26/2021 >60  >60 mL/min Final   Comment: (NOTE) Calculated using the CKD-EPI Creatinine Equation (2021)    Anion gap 01/26/2021 10  5 - 15 Final   Performed at [facility], [place] [address] [place], [place] [address]   WBC 01/26/2021 11.4 (H)  4.0 - 10.5 K/uL Final   RBC 01/26/2021 4.93  3.87 - 5.11 MIL/uL Final   Hemoglobin 01/26/2021 13.8  12.0 - 15.0 g/dL Final   HCT 01/26/2021 40.7  36.0 - 46.0 % Final   MCV 01/26/2021 82.6  80.0 - 100.0 fL Final   MCH 01/26/2021 28.0  26.0 - 34.0 pg Final   MCHC 01/26/2021 33.9  30.0 - 36.0 g/dL Final   RDW 01/26/2021 12.3  11.5 - 15.5 % Final   Platelets 01/26/2021 338  150 - 400 K/uL Final  nRBC 01/26/2021 0.0  0.0 - 0.2 % Final   Performed at [place] [address], [place], [place] [address]   Lipase 01/26/2021 47  11 - 51 U/L Final   Performed at [facility], [place] [address], [place], [place] [address]   Preg Test, Ur 01/26/2021 NEGATIVE  NEGATIVE Final   Comment:        THE SENSITIVITY OF THIS METHODOLOGY IS >20 mIU/mL. Performed at [facility], [place] [address], [place], [place] [address]    Color, Urine 01/26/2021 YELLOW  YELLOW Final   APPearance 01/26/2021 CLEAR  CLEAR Final   Specific Gravity, Urine 01/26/2021 1.025  1.005 - 1.030 Final   pH 01/26/2021 6.0  5.0 - 8.0 Final   Glucose, UA 01/26/2021 NEGATIVE  NEGATIVE mg/dL Final   Hgb urine dipstick 01/26/2021 NEGATIVE  NEGATIVE Final   Bilirubin Urine 01/26/2021 NEGATIVE  NEGATIVE Final   Ketones, ur 01/26/2021 NEGATIVE  NEGATIVE mg/dL Final   Protein, ur 01/26/2021 NEGATIVE  NEGATIVE mg/dL Final    Nitrite 01/26/2021 NEGATIVE  NEGATIVE Final   Leukocytes,Ua 01/26/2021 MODERATE (A)  NEGATIVE Final   RBC / HPF 01/26/2021 0-5  0 - 5 RBC/hpf Final   WBC, UA 01/26/2021 21-50  0 - 5 WBC/hpf Final   Bacteria, UA 01/26/2021 RARE (A)  NONE SEEN Final   Squamous Epithelial / LPF 01/26/2021 0-5  0 - 5 Final   Mucus 01/26/2021 PRESENT   Final   Performed at [facility], [place] [address], [place], [place] [address]   Specimen Description 01/26/2021    Final                   Value:URINE, CLEAN CATCH Performed at [facility], [place] [address] [place], [place] [address]    Special Requests 01/26/2021    Final                   Value:NONE Performed at [facility], [place] [address], [place], [place] [address]    Culture 01/26/2021 MULTIPLE SPECIES PRESENT, SUGGEST RECOLLECTION (A)   Final   Report Status 01/26/2021 01/27/2021 FINAL   Final  Admission on 11/22/2020, Discharged on 11/22/2020  Component Date Value Ref Range Status   I-stat hCG, quantitative 11/22/2020 >2,000.0 (H)  <5 mIU/mL Final   Comment 3 11/22/2020          Final   Comment:   GEST. AGE      CONC.  (mIU/mL)   <=1 WEEK        5 - 50     2 WEEKS       50 - 500     3 WEEKS       100 - 10,000     4 WEEKS     1,000 - 30,000        FEMALE AND NON-PREGNANT FEMALE:     LESS THAN 5 mIU/mL    WBC 11/22/2020 12.6 (H)  4.0 - 10.5 K/uL Final   RBC 11/22/2020 4.60  3.87 - 5.11 MIL/uL Final   Hemoglobin 11/22/2020 13.3  12.0 - 15.0 g/dL Final   HCT 11/22/2020 38.6  36.0 - 46.0 % Final   MCV 11/22/2020 83.9  80.0 - 100.0 fL Final   MCH 11/22/2020 28.9  26.0 - 34.0 pg Final   MCHC 11/22/2020 34.5  30.0 - 36.0 g/dL Final   RDW 11/22/2020 12.2  11.5 - 15.5 %  Final   Platelets 11/22/2020 306  150 - 400 K/uL Final   nRBC 11/22/2020 0.0  0.0 - 0.2 % Final   Neutrophils Relative % 11/22/2020 72  % Final   Neutro Abs 11/22/2020 9.2 (H)  1.7 - 7.7 K/uL Final   Lymphocytes Relative  11/22/2020 21  % Final   Lymphs Abs 11/22/2020 2.6  0.7 - 4.0 K/uL Final   Monocytes Relative 11/22/2020 5  % Final   Monocytes Absolute 11/22/2020 0.6  0.1 - 1.0 K/uL Final   Eosinophils Relative 11/22/2020 1  % Final   Eosinophils Absolute 11/22/2020 0.1  0.0 - 0.5 K/uL Final   Basophils Relative 11/22/2020 0  % Final   Basophils Absolute 11/22/2020 0.0  0.0 - 0.1 K/uL Final   Immature Granulocytes 11/22/2020 1  % Final   Abs Immature Granulocytes 11/22/2020 0.10 (H)  0.00 - 0.07 K/uL Final   Performed at [facility], [place] [address], [place], [place] [address]   Sodium 11/22/2020 134 (L)  135 - 145 mmol/L Final   Potassium 11/22/2020 3.7  3.5 - 5.1 mmol/L Final   Chloride 11/22/2020 101  98 - 111 mmol/L Final   CO2 11/22/2020 22  22 - 32 mmol/L Final   Glucose, Bld 11/22/2020 97  70 - 99 mg/dL Final   Glucose reference range applies only to samples taken after fasting for at least 8 hours.   BUN 11/22/2020 8  6 - 20 mg/dL Final   Creatinine, Ser 11/22/2020 0.70  0.44 - 1.00 mg/dL Final   Calcium 11/22/2020 9.7  8.9 - 10.3 mg/dL Final   Total Protein 11/22/2020 7.3  6.5 - 8.1 g/dL Final   Albumin 11/22/2020 3.7  3.5 - 5.0 g/dL Final   AST 11/22/2020 22  15 - 41 U/L Final   ALT 11/22/2020 34  0 - 44 U/L Final   Alkaline Phosphatase 11/22/2020 54  38 - 126 U/L Final   Total Bilirubin 11/22/2020 0.7  0.3 - 1.2 mg/dL Final   GFR, Estimated 11/22/2020 >60  >60 mL/min Final   Comment: (NOTE) Calculated using the CKD-EPI Creatinine Equation (2021)    Anion gap 11/22/2020 11  5 - 15 Final   Performed at [place] [address] [place], [place] [address]   Color, Urine 11/22/2020 AMBER (A)  YELLOW Final   BIOCHEMICALS MAY BE AFFECTED BY COLOR   APPearance 11/22/2020 HAZY (A)  CLEAR Final   Specific Gravity, Urine 11/22/2020 1.028  1.005 - 1.030 Final   pH 11/22/2020 5.0  5.0 - 8.0 Final   Glucose, UA 11/22/2020 NEGATIVE  NEGATIVE mg/dL Final   Hgb urine dipstick  11/22/2020 NEGATIVE  NEGATIVE Final   Bilirubin Urine 11/22/2020 NEGATIVE  NEGATIVE Final   Ketones, ur 11/22/2020 NEGATIVE  NEGATIVE mg/dL Final   Protein, ur 11/22/2020 30 (A)  NEGATIVE mg/dL Final   Nitrite 11/22/2020 NEGATIVE  NEGATIVE Final   Leukocytes,Ua 11/22/2020 LARGE (A)  NEGATIVE Final   RBC / HPF 11/22/2020 0-5  0 - 5 RBC/hpf Final   WBC, UA 11/22/2020 21-50  0 - 5 WBC/hpf Final   Bacteria, UA 11/22/2020 FEW (A)  NONE SEEN Final   Squamous Epithelial / LPF 11/22/2020 6-10  0 - 5 Final   Mucus 11/22/2020 PRESENT   Final   Performed at [facility], [place] [address] [place], [place] [address]   hCG, Ollen Barges, America Brown, S 11/22/2020 62,600 (H)  <5 mIU/mL Final   Comment:  GEST. AGE      CONC.  (mIU/mL)   <=1 WEEK        5 - 50     2 WEEKS       50 - 500     3 WEEKS       100 - 10,000     4 WEEKS     1,000 - 30,000     5 WEEKS     3,500 - 115,000   6-8 WEEKS     12,000 - 270,000    12 WEEKS     15,000 - 220,000        FEMALE AND NON-PREGNANT FEMALE:     LESS THAN 5 mIU/mL Performed at [facility], [place] [address] [place], [place] [address]    Yeast Wet Prep HPF POC 11/22/2020 NONE SEEN  NONE SEEN Final   Trich, Wet Prep 11/22/2020 NONE SEEN  NONE SEEN Final   Clue Cells Wet Prep HPF POC 11/22/2020 PRESENT (A)  NONE SEEN Final   WBC, Wet Prep HPF POC 11/22/2020 MANY (A)  NONE SEEN Final   Sperm 11/22/2020 NONE SEEN   Final   Performed at [facility], [place] [address] [place], [place] [address]   Neisseria Gonorrhea 11/22/2020 Negative   Final   Chlamydia 11/22/2020 Negative   Final   Comment 11/22/2020 Normal Reference [place] Chlamydia - Negative   Final   Comment 11/22/2020 Normal Reference Range Neisseria Gonorrhea - Negative   Final    Allergies: Corn-containing products, Percocet [oxycodone-acetaminophen], and Other  PTA Medications: (Not in a hospital admission)   Medical Decision Making  Patient is a 26 year old female with past  psychiatric and medical history as stated above who presents to the [facility] voluntarily via law enforcement for worsening depressive symptoms and what appears to likely be a suicide attempt via patient wrapping duct tape around her neck earlier this evening (see HPI for details). Patient does appear to be guarded and not entirely forthcoming with her answers to questions regarding topics of suicidal ideation.  Based on patient's current presentation and recent self injurious behavior (see details above), patient appears to be a danger to herself at this time and patient's depressive symptoms seem to be severely worsening to the point that patient is having significant difficulty in functioning in her activities of daily living at this time.  Thus, based on these factors, patient meets inpatient psychiatric treatment criteria at this time.    Recommendations  Based on my evaluation the patient does not appear to have an emergency medical condition.  Patient's vital signs stable: BP 135/88, temp afebrile at 98.1 F, pulse 84, respirations 20, SPO2 98% on room air.  Based on patient's current presentation, patient's reported lack of physical symptoms noted above (see HPI/PE/ROS for details), and my physical examination of the patient, the patient does not appear to have sustained/experienced any emergent medical issues secondary to the incident that occurred earlier this evening the patient wrapping duct tape around her neck (see HPI for details) that would require further emergent medical work-up at this time and patient does not appear to be experiencing an emergent medical condition at this time.  Recommend inpatient psychiatric treatment for the patient.  Patient is agreeable to inpatient psychiatric treatment. Per [facility], there are currently no available beds at [facility] for the patient at this time.  Social work to seek placement for inpatient psychiatric treatment.  Patient will be admitted to [facility] continuous  assessment for further crisis stabilization and treatment while waiting for placement for inpatient psychiatric treatment.  Inpatient psychiatric treatment labs/tests ordered and reviewed:  -PCR Flu A&B, COVID: Negative  -UDS: Positive for marijuana  -Urine pregnancy: Negative  -CBC with differential: Slight leukocytosis noted with slight white blood cell count elevation at 11.1 K/uL.  Based on patient's current presentation, including lack of physical symptoms at this time, this lab value does not appear to be indicative of an emergent medical condition at this time.  CBC otherwise unremarkable.  -CMP: ALT elevated at 67 U/L. Based on patient's current presentation, including lack of physical symptoms at this time, this lab value does not appear to be indicative of an emergent medical condition at this time.  CMP otherwise unremarkable.  -Ethanol: Within normal limits/less than 10 mg/dL  -Hemoglobin A1c: Within normal limits at 5.0%  -Lipid panel: Total cholesterol elevated at 229 mg/dL, triglycerides elevated at 214 mg/dL, VLDL slightly elevated at 43 mg/dL, LDL cholesterol elevated at 141 mg/dL.  Patient may follow up with PCP upon future discharge regarding these lipid panel values.  -TSH: Within normal limits at 2.581 uIU/mL  Will continue the following home medications at this time:  -Zoloft 150 mg p.o. 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   Additional as needed medications ordered:  -Tylenol 650 mg p.o. every 6 hours as needed for mild pain, fever, or headache (patient does report allergy to Percocet-see allergy section for details, the patient states that she takes Tylenol at home as needed without any adverse effects or issues)  -Milk of Magnesia 30 mL p.o. daily as needed for mild constipation  -Maalox/Mylanta 30 mL p.o. every 4 hours as needed for  indigestion  Prescilla Sours, PA-C 02/28/21  2:47 AM

## 2021-02-28 NOTE — Progress Notes (Signed)
Pt was accepted to [facility] 02/28/21; Bed Assignment 538.  Pt meets inpatient criteria per Doran Heater, FNP  Attending Physician will be Dr. Toni Amend  Report can be called to: [phone]  Care Team notified via secure chat: Doran Heater, FNP, Leonarda Salon, RN, Angeline Slim, RN, Sharion Dove, RN, Damita Dunnings, LCSWA, Mordecai Rasmussen, MD, Robinette Haines, Counselor, Rona Ravens, RN, and Nickie Retort, RN.  Kelton Pillar, LCSWA 02/28/2021 @ 5:11 PM

## 2021-02-28 NOTE — ED Provider Notes (Signed)
Behavioral Health Progress Note  Date and Time: 02/28/2021 2:28 PM Name: Barbara [name] MRN:  [id number]  Subjective: Patient states "my mind keeps replaying everything."  She reports "my husband cheated on me and my mom treats me bad, she treats me like the black sheep of the family, I was bullied at school."  Barbara [name] reports frustration that she feels she is chronically treated poorly.  She reports feeling that she is kind to everyone else and no one is kind to her.  She states "I am tired, I am mentally just tired for years."  Patient is reassessed, face-to-face by nurse practitioner.  She is reclined on observation unit upon my approach. Barbara [name] is alert and oriented.  She is cooperative during assessment.  She presents with depressed mood, tearful affect.  She denies suicidal and homicidal ideations today however she is unable to contract for safety with this Probation officer.  Barbara [name] denies auditory visual hallucinations.  There is no evidence of delusional thought content and no indication that patient is responding to internal stimuli.  She denies paranoia.  Patient offered support and encouragement.  She agrees with plan for inpatient psychiatric hospitalization.  Discussed initiating hydroxyzine.  Discussed side effects as well as potential benefits. Barbara [name] agrees with plan to initiate hydroxyzine 25 mg 3 times daily as needed/anxiety.   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, also been evaluated by neurology   UTI  (urinary tract infection)     Past Surgical History:  Procedure Laterality Date   NO PAST SURGERIES     Family History:  Family History  Problem Relation Age of Onset   Depression Mother    Healthy Brother    Healthy Sister    Hypertension Maternal Grandmother    Depression Maternal Grandmother    Asthma Maternal Grandmother    Diabetes Maternal Grandmother    Heart disease Maternal Grandmother    Hearing loss Maternal Grandfather    COPD Maternal Grandfather    Family Psychiatric  History: None reported Social History:  Social History   Substance and Sexual Activity  Alcohol Use No   Alcohol/week: 0.0 standard drinks     Social History   Substance and Sexual Activity  Drug Use No    Social History   Socioeconomic History   Marital status: Legally Separated    Spouse name: Not on file   Number of children: 1   Years of education: 11   Highest education level: Not on file  Occupational History   Occupation: unemployed  Tobacco Use   Smoking status: Never   Smokeless tobacco: Never  Vaping Use   Vaping Use: Never used  Substance and Sexual Activity   Alcohol use: No    Alcohol/week: 0.0 standard drinks   Drug use: No   Sexual activity: Not Currently    Partners: Male  Other Topics Concern   Not on file  Social History Narrative   Patient does not drink caffeine.   Patient is right handed.   Social  Determinants of Health   Financial Resource Strain: Not on file  Food Insecurity: Not on file  Transportation Needs: Not on file  Physical Activity: Not on file  Stress: Not on file  Social Connections: Not on file   SDOH:  SDOH Screenings   Alcohol Screen: Not on file  Depression (PHQ2-9): Not on file  Financial Resource Strain: Not on file  Food Insecurity: Not on file  Housing: Not on file  Physical Activity: Not on file  Social Connections: Not on file  Stress: Not on file  Tobacco Use: Low Risk    Smoking Tobacco Use: Never   Smokeless  Tobacco Use: Never   Passive Exposure: Not on file  Transportation Needs: Not on file   Additional Social History:    Pain Medications: See MAR Prescriptions: See MAR Over the Counter: See MAR History of alcohol / drug use?: No history of alcohol / drug abuse                    Sleep: Fair  Appetite:  Fair  Current Medications:  Current Facility-Administered Medications  Medication Dose Route Frequency Provider Last Rate Last Admin   acetaminophen (TYLENOL) tablet 650 mg  650 mg Oral Q6H PRN Prescilla Sours, PA-C       alum & mag hydroxide-simeth (MAALOX/MYLANTA) 200-200-20 MG/5ML suspension 30 mL  30 mL Oral Q4H PRN Margorie John W, PA-C       hydrOXYzine (ATARAX) tablet 25 mg  25 mg Oral TID PRN Lucky Rathke, FNP       magnesium hydroxide (MILK OF MAGNESIA) suspension 30 mL  30 mL Oral Daily PRN Margorie John W, PA-C       sertraline (ZOLOFT) tablet 150 mg  150 mg Oral QHS Prescilla Sours, PA-C       Current Outpatient Medications  Medication Sig Dispense Refill   sertraline (ZOLOFT) 50 MG tablet Take 1 tablet (50 mg total) by mouth daily. (Patient taking differently: Take 100 mg by mouth at bedtime.) 90 tablet 1    Labs  Lab Results:  Admission on 02/27/2021  Component Date Value Ref Range Status   SARS Coronavirus 2 by RT PCR 02/28/2021 NEGATIVE  NEGATIVE Final   Comment: (NOTE) SARS-CoV-2 target nucleic acids are NOT DETECTED.  The SARS-CoV-2 RNA is generally detectable in upper respiratory specimens during the acute phase of infection. The lowest concentration of SARS-CoV-2 viral copies this assay can detect is 138 copies/mL. A negative result does not preclude SARS-Cov-2 infection and should not be used as the sole basis for treatment or other patient management decisions. A negative result may occur with  improper specimen collection/handling, submission of specimen other than nasopharyngeal swab, presence of viral mutation(s) within the areas targeted by  this assay, and inadequate number of viral copies(<138 copies/mL). A negative result must be combined with clinical observations, patient history, and epidemiological information. The expected result is Negative.  Fact Sheet for Patients:  EntrepreneurPulse.com.au  Fact Sheet for Healthcare Providers:  IncredibleEmployment.be  This test is no                          t yet approved or cleared by the [place] FDA and  has been authorized for detection and/or diagnosis of SARS-CoV-2 by FDA under an Emergency Use Authorization (EUA). This EUA will remain  in effect (meaning this test can be used) for the duration of the COVID-19 declaration under Section  564(b)(1) of the Act, 21 U.S.C.section 360bbb-3(b)(1), unless the authorization is terminated  or revoked sooner.       Influenza A by PCR 02/28/2021 NEGATIVE  NEGATIVE Final   Influenza B by PCR 02/28/2021 NEGATIVE  NEGATIVE Final   Comment: (NOTE) The Xpert Xpress SARS-CoV-2/FLU/RSV plus assay is intended as an aid in the diagnosis of influenza from Nasopharyngeal swab specimens and should not be used as a sole basis for treatment. Nasal washings and aspirates are unacceptable for Xpert Xpress SARS-CoV-2/FLU/RSV testing.  Fact Sheet for Patients: EntrepreneurPulse.com.au  Fact Sheet for Healthcare Providers: IncredibleEmployment.be  This test is not yet approved or cleared by the [place] FDA and has been authorized for detection and/or diagnosis of SARS-CoV-2 by FDA under an Emergency Use Authorization (EUA). This EUA will remain in effect (meaning this test can be used) for the duration of the COVID-19 declaration under Section 564(b)(1) of the Act, 21 U.S.C. section 360bbb-3(b)(1), unless the authorization is terminated or revoked.  Performed at [facility], [place] [address], [place], [place] [address]    SARS Coronavirus 2 Ag  02/28/2021 Negative  Negative Preliminary   WBC 02/28/2021 11.1 (H)  4.0 - 10.5 K/uL Final   RBC 02/28/2021 4.72  3.87 - 5.11 MIL/uL Final   Hemoglobin 02/28/2021 13.1  12.0 - 15.0 g/dL Final   HCT 02/28/2021 39.1  36.0 - 46.0 % Final   MCV 02/28/2021 82.8  80.0 - 100.0 fL Final   MCH 02/28/2021 27.8  26.0 - 34.0 pg Final   MCHC 02/28/2021 33.5  30.0 - 36.0 g/dL Final   RDW 02/28/2021 12.4  11.5 - 15.5 % Final   Platelets 02/28/2021 293  150 - 400 K/uL Final   nRBC 02/28/2021 0.0  0.0 - 0.2 % Final   Neutrophils Relative % 02/28/2021 57  % Final   Neutro Abs 02/28/2021 6.4  1.7 - 7.7 K/uL Final   Lymphocytes Relative 02/28/2021 33  % Final   Lymphs Abs 02/28/2021 3.6  0.7 - 4.0 K/uL Final   Monocytes Relative 02/28/2021 7  % Final   Monocytes Absolute 02/28/2021 0.7  0.1 - 1.0 K/uL Final   Eosinophils Relative 02/28/2021 1  % Final   Eosinophils Absolute 02/28/2021 0.2  0.0 - 0.5 K/uL Final   Basophils Relative 02/28/2021 1  % Final   Basophils Absolute 02/28/2021 0.1  0.0 - 0.1 K/uL Final   Immature Granulocytes 02/28/2021 1  % Final   Abs Immature Granulocytes 02/28/2021 0.05  0.00 - 0.07 K/uL Final   Performed at [facility], [place] [address], [place], [place] [address]   Sodium 02/28/2021 141  135 - 145 mmol/L Final   Potassium 02/28/2021 4.2  3.5 - 5.1 mmol/L Final   Chloride 02/28/2021 107  98 - 111 mmol/L Final   CO2 02/28/2021 24  22 - 32 mmol/L Final   Glucose, Bld 02/28/2021 91  70 - 99 mg/dL Final   Glucose reference range applies only to samples taken after fasting for at least 8 hours.   BUN 02/28/2021 11  6 - 20 mg/dL Final   Creatinine, Ser 02/28/2021 0.78  0.44 - 1.00 mg/dL Final   Calcium 02/28/2021 9.8  8.9 - 10.3 mg/dL Final   Total Protein 02/28/2021 7.9  6.5 - 8.1 g/dL Final   Albumin 02/28/2021 4.1  3.5 - 5.0 g/dL Final   AST 02/28/2021 32  15 - 41 U/L Final   ALT 02/28/2021 67 (H)  0 -  44 U/L Final   Alkaline Phosphatase 02/28/2021 55  38 - 126 U/L  Final   Total Bilirubin 02/28/2021 0.5  0.3 - 1.2 mg/dL Final   GFR, Estimated 02/28/2021 >60  >60 mL/min Final   Comment: (NOTE) Calculated using the CKD-EPI Creatinine Equation (2021)    Anion gap 02/28/2021 10  5 - 15 Final   Performed at [facility], [address], [place], [place] [address]   Alcohol, Ethyl (B) 02/28/2021 <10  <10 mg/dL Final   Comment: (NOTE) Lowest detectable limit for serum alcohol is 10 mg/dL.  For medical purposes only. Performed at [facility], [address], [place], [place] [address]    Hgb A1c MFr Bld 02/28/2021 5.0  4.8 - 5.6 % Final   Comment: (NOTE) Pre diabetes:          5.7%-6.4%  Diabetes:              >6.4%  Glycemic control for   <7.0% adults with diabetes    Mean Plasma Glucose 02/28/2021 96.8  mg/dL Final   Performed at [facility], [address], [place], [place] [address]   Cholesterol 02/28/2021 229 (H)  0 - 200 mg/dL Final   Triglycerides 91/47/8295 214 (H)  <150 mg/dL Final   HDL 62/13/0865 45  >40 mg/dL Final   Total CHOL/HDL Ratio 02/28/2021 5.1  RATIO Final   VLDL 02/28/2021 43 (H)  0 - 40 mg/dL Final   LDL Cholesterol 02/28/2021 141 (H)  0 - 99 mg/dL Final   Comment:        Total Cholesterol/HDL:CHD Risk Coronary Heart Disease Risk Table                     Men   Women  1/2 Average Risk   3.4   3.3  Average Risk       5.0   4.4  2 X Average Risk   9.6   7.1  3 X Average Risk  23.4   11.0        Use the calculated Patient Ratio above and the CHD Risk Table to determine the patient's CHD Risk.        ATP III CLASSIFICATION (LDL):  <100     mg/dL   Optimal  784-696  mg/dL   Near or Above                    Optimal  130-159  mg/dL   Borderline  295-284  mg/dL   High  >132     mg/dL   Very High Performed at [facility], [address] [place], [place] [address]    TSH 02/28/2021 2.581  0.350 - 4.500 uIU/mL Final   Comment: Performed by a 3rd Generation assay with a functional  sensitivity of <=0.01 uIU/mL. Performed at [facility], [address], [place], [place] [address]    POC Amphetamine UR 02/28/2021 None Detected  NONE DETECTED (Cut Off Level 1000 ng/mL) Final   POC Secobarbital (BAR) 02/28/2021 None Detected  NONE DETECTED (Cut Off Level 300 ng/mL) Final   POC Buprenorphine (BUP) 02/28/2021 None Detected  NONE DETECTED (Cut Off Level 10 ng/mL) Final   POC Oxazepam (BZO) 02/28/2021 None Detected  NONE DETECTED (Cut Off Level 300 ng/mL) Final   POC Cocaine UR 02/28/2021 None Detected  NONE DETECTED (Cut Off Level 300 ng/mL) Final   POC Methamphetamine UR 02/28/2021 None  Detected  NONE DETECTED (Cut Off Level 1000 ng/mL) Final   POC Morphine 02/28/2021 None Detected  NONE DETECTED (Cut Off Level 300 ng/mL) Final   POC Oxycodone UR 02/28/2021 None Detected  NONE DETECTED (Cut Off Level 100 ng/mL) Final   POC Methadone UR 02/28/2021 None Detected  NONE DETECTED (Cut Off Level 300 ng/mL) Final   POC Marijuana UR 02/28/2021 Positive (A)  NONE DETECTED (Cut Off Level 50 ng/mL) Final   SARSCOV2ONAVIRUS 2 AG 02/28/2021 NEGATIVE  NEGATIVE Final   Comment: (NOTE) SARS-CoV-2 antigen NOT DETECTED.   Negative results are presumptive.  Negative results do not preclude SARS-CoV-2 infection and should not be used as the sole basis for treatment or other patient management decisions, including infection  control decisions, particularly in the presence of clinical signs and  symptoms consistent with COVID-19, or in those who have been in contact with the virus.  Negative results must be combined with clinical observations, patient history, and epidemiological information. The expected result is Negative.  Fact Sheet for Patients: https://www.jennings-kim.com/  Fact Sheet for Healthcare Providers: https://alexander-rogers.biz/  This test is not yet approved or cleared by the [place] FDA and  has been authorized for detection and/or  diagnosis of SARS-CoV-2 by FDA under an Emergency Use Authorization (EUA).  This EUA will remain in effect (meaning this test can be used) for the duration of  the COV                          ID-19 declaration under Section 564(b)(1) of the Act, 21 U.S.C. section 360bbb-3(b)(1), unless the authorization is terminated or revoked sooner.     Preg Test, Ur 02/28/2021 NEGATIVE  NEGATIVE Final   Comment:        THE SENSITIVITY OF THIS METHODOLOGY IS >24 mIU/mL   Admission on 01/26/2021, Discharged on 01/26/2021  Component Date Value Ref Range Status   Sodium 01/26/2021 136  135 - 145 mmol/L Final   Potassium 01/26/2021 3.6  3.5 - 5.1 mmol/L Final   Chloride 01/26/2021 106  98 - 111 mmol/L Final   CO2 01/26/2021 20 (L)  22 - 32 mmol/L Final   Glucose, Bld 01/26/2021 120 (H)  70 - 99 mg/dL Final   Glucose reference range applies only to samples taken after fasting for at least 8 hours.   BUN 01/26/2021 12  6 - 20 mg/dL Final   Creatinine, Ser 01/26/2021 0.77  0.44 - 1.00 mg/dL Final   Calcium 11/91/4782 9.6  8.9 - 10.3 mg/dL Final   GFR, Estimated 01/26/2021 >60  >60 mL/min Final   Comment: (NOTE) Calculated using the CKD-EPI Creatinine Equation (2021)    Anion gap 01/26/2021 10  5 - 15 Final   Performed at [facility], [address] [place], [place] [address]   WBC 01/26/2021 11.4 (H)  4.0 - 10.5 K/uL Final   RBC 01/26/2021 4.93  3.87 - 5.11 MIL/uL Final   Hemoglobin 01/26/2021 13.8  12.0 - 15.0 g/dL Final   HCT 30/86/5784 40.7  36.0 - 46.0 % Final   MCV 01/26/2021 82.6  80.0 - 100.0 fL Final   MCH 01/26/2021 28.0  26.0 - 34.0 pg Final   MCHC 01/26/2021 33.9  30.0 - 36.0 g/dL Final   RDW 69/62/9528 12.3  11.5 - 15.5 % Final   Platelets 01/26/2021 338  150 - 400 K/uL Final   nRBC 01/26/2021 0.0  0.0 - 0.2 % Final  Performed at [facility], [place] [address] [place], [place] [address]   Lipase 01/26/2021 47  11 - 51 U/L Final   Performed at  [facility], [place] [address] [place], [place] [address]   Preg Test, Ur 01/26/2021 NEGATIVE  NEGATIVE Final   Comment:        THE SENSITIVITY OF THIS METHODOLOGY IS >20 mIU/mL. Performed at [facility], [place] [address] [place], [place] [address]    Color, Urine 01/26/2021 YELLOW  YELLOW Final   APPearance 01/26/2021 CLEAR  CLEAR Final   Specific Gravity, Urine 01/26/2021 1.025  1.005 - 1.030 Final   pH 01/26/2021 6.0  5.0 - 8.0 Final   Glucose, UA 01/26/2021 NEGATIVE  NEGATIVE mg/dL Final   Hgb urine dipstick 01/26/2021 NEGATIVE  NEGATIVE Final   Bilirubin Urine 01/26/2021 NEGATIVE  NEGATIVE Final   Ketones, ur 01/26/2021 NEGATIVE  NEGATIVE mg/dL Final   Protein, ur 01/26/2021 NEGATIVE  NEGATIVE mg/dL Final   Nitrite 01/26/2021 NEGATIVE  NEGATIVE Final   Leukocytes,Ua 01/26/2021 MODERATE (A)  NEGATIVE Final   RBC / HPF 01/26/2021 0-5  0 - 5 RBC/hpf Final   WBC, UA 01/26/2021 21-50  0 - 5 WBC/hpf Final   Bacteria, UA 01/26/2021 RARE (A)  NONE SEEN Final   Squamous Epithelial / LPF 01/26/2021 0-5  0 - 5 Final   Mucus 01/26/2021 PRESENT   Final   Performed at [facility], [place] [address] [place], [place] [address]   Specimen Description 01/26/2021    Final                   Value:URINE, CLEAN CATCH Performed at [facility], [place] [address], [place], [place] [address]    Special Requests 01/26/2021    Final                   Value:NONE Performed at [facility], [place] [address] [place], [place] [address]    Culture 01/26/2021 MULTIPLE SPECIES PRESENT, SUGGEST RECOLLECTION (A)   Final   Report Status 01/26/2021 01/27/2021 FINAL   Final  Admission on 11/22/2020, Discharged on 11/22/2020  Component Date Value Ref Range Status   I-stat hCG, quantitative 11/22/2020 >2,000.0 (H)  <5 mIU/mL Final   Comment 3 11/22/2020          Final   Comment:   GEST. AGE      CONC.  (mIU/mL)   <=1 WEEK         5 - 50     2 WEEKS       50 - 500     3 WEEKS       100 - 10,000     4 WEEKS     1,000 - 30,000        FEMALE AND NON-PREGNANT FEMALE:     LESS THAN 5 mIU/mL    WBC 11/22/2020 12.6 (H)  4.0 - 10.5 K/uL Final   RBC 11/22/2020 4.60  3.87 - 5.11 MIL/uL Final   Hemoglobin 11/22/2020 13.3  12.0 - 15.0 g/dL Final   HCT 11/22/2020 38.6  36.0 - 46.0 % Final   MCV 11/22/2020 83.9  80.0 - 100.0 fL Final   MCH 11/22/2020 28.9  26.0 - 34.0 pg Final   MCHC 11/22/2020 34.5  30.0 - 36.0 g/dL Final   RDW 11/22/2020 12.2  11.5 - 15.5 % Final   Platelets 11/22/2020 306  150 - 400  K/uL Final   nRBC 11/22/2020 0.0  0.0 - 0.2 % Final   Neutrophils Relative % 11/22/2020 72  % Final   Neutro Abs 11/22/2020 9.2 (H)  1.7 - 7.7 K/uL Final   Lymphocytes Relative 11/22/2020 21  % Final   Lymphs Abs 11/22/2020 2.6  0.7 - 4.0 K/uL Final   Monocytes Relative 11/22/2020 5  % Final   Monocytes Absolute 11/22/2020 0.6  0.1 - 1.0 K/uL Final   Eosinophils Relative 11/22/2020 1  % Final   Eosinophils Absolute 11/22/2020 0.1  0.0 - 0.5 K/uL Final   Basophils Relative 11/22/2020 0  % Final   Basophils Absolute 11/22/2020 0.0  0.0 - 0.1 K/uL Final   Immature Granulocytes 11/22/2020 1  % Final   Abs Immature Granulocytes 11/22/2020 0.10 (H)  0.00 - 0.07 K/uL Final   Performed at [facility], [place] [address] [place], [place] [address]   Sodium 11/22/2020 134 (L)  135 - 145 mmol/L Final   Potassium 11/22/2020 3.7  3.5 - 5.1 mmol/L Final   Chloride 11/22/2020 101  98 - 111 mmol/L Final   CO2 11/22/2020 22  22 - 32 mmol/L Final   Glucose, Bld 11/22/2020 97  70 - 99 mg/dL Final   Glucose reference range applies only to samples taken after fasting for at least 8 hours.   BUN 11/22/2020 8  6 - 20 mg/dL Final   Creatinine, Ser 11/22/2020 0.70  0.44 - 1.00 mg/dL Final   Calcium 11/22/2020 9.7  8.9 - 10.3 mg/dL Final   Total Protein 11/22/2020 7.3  6.5 - 8.1 g/dL Final   Albumin 11/22/2020 3.7  3.5 - 5.0 g/dL Final    AST 11/22/2020 22  15 - 41 U/L Final   ALT 11/22/2020 34  0 - 44 U/L Final   Alkaline Phosphatase 11/22/2020 54  38 - 126 U/L Final   Total Bilirubin 11/22/2020 0.7  0.3 - 1.2 mg/dL Final   GFR, Estimated 11/22/2020 >60  >60 mL/min Final   Comment: (NOTE) Calculated using the CKD-EPI Creatinine Equation (2021)    Anion gap 11/22/2020 11  5 - 15 Final   Performed at [place] [address] [place], [place] [address]   Color, Urine 11/22/2020 AMBER (A)  YELLOW Final   BIOCHEMICALS MAY BE AFFECTED BY COLOR   APPearance 11/22/2020 HAZY (A)  CLEAR Final   Specific Gravity, Urine 11/22/2020 1.028  1.005 - 1.030 Final   pH 11/22/2020 5.0  5.0 - 8.0 Final   Glucose, UA 11/22/2020 NEGATIVE  NEGATIVE mg/dL Final   Hgb urine dipstick 11/22/2020 NEGATIVE  NEGATIVE Final   Bilirubin Urine 11/22/2020 NEGATIVE  NEGATIVE Final   Ketones, ur 11/22/2020 NEGATIVE  NEGATIVE mg/dL Final   Protein, ur 11/22/2020 30 (A)  NEGATIVE mg/dL Final   Nitrite 11/22/2020 NEGATIVE  NEGATIVE Final   Leukocytes,Ua 11/22/2020 LARGE (A)  NEGATIVE Final   RBC / HPF 11/22/2020 0-5  0 - 5 RBC/hpf Final   WBC, UA 11/22/2020 21-50  0 - 5 WBC/hpf Final   Bacteria, UA 11/22/2020 FEW (A)  NONE SEEN Final   Squamous Epithelial / LPF 11/22/2020 6-10  0 - 5 Final   Mucus 11/22/2020 PRESENT   Final   Performed at [facility], [place] [address], [place], [place] [address]   hCG, Ollen Barges, America Brown, S 11/22/2020 62,600 (H)  <5 mIU/mL Final   Comment:          GEST. AGE  CONC.  (mIU/mL)   <=1 WEEK        5 - 50     2 WEEKS       50 - 500     3 WEEKS       100 - 10,000     4 WEEKS     1,000 - 30,000     5 WEEKS     3,500 - 115,000   6-8 WEEKS     12,000 - 270,000    12 WEEKS     15,000 - 220,000        FEMALE AND NON-PREGNANT FEMALE:     LESS THAN 5 mIU/mL Performed at [facility], [place] [address] [place], [place] [address]    Yeast Wet Prep HPF POC 11/22/2020 NONE SEEN  NONE SEEN Final   Trich, Wet  Prep 11/22/2020 NONE SEEN  NONE SEEN Final   Clue Cells Wet Prep HPF POC 11/22/2020 PRESENT (A)  NONE SEEN Final   WBC, Wet Prep HPF POC 11/22/2020 MANY (A)  NONE SEEN Final   Sperm 11/22/2020 NONE SEEN   Final   Performed at [facility], [place] [address], [place], [place] [address]   Neisseria Gonorrhea 11/22/2020 Negative   Final   Chlamydia 11/22/2020 Negative   Final   Comment 11/22/2020 Normal Reference [place] Chlamydia - Negative   Final   Comment 11/22/2020 Normal Reference Range Neisseria Gonorrhea - Negative   Final    Blood Alcohol level:  Lab Results  Component Value Date   ETH <10 [id number]    Metabolic Disorder Labs: Lab Results  Component Value Date   HGBA1C 5.0 02/28/2021   MPG 96.8 02/28/2021   No results found for: PROLACTIN Lab Results  Component Value Date   CHOL 229 (H) 02/28/2021   TRIG 214 (H) 02/28/2021   HDL 45 02/28/2021   CHOLHDL 5.1 02/28/2021   VLDL 43 (H) 02/28/2021   LDLCALC 141 (H) 02/28/2021    Therapeutic Lab Levels: No results found for: LITHIUM No results found for: VALPROATE No components found for:  CBMZ  Physical Findings   Flowsheet Row ED from 02/27/2021 in [facility] ED from 01/26/2021 in [place] DEPT ED to Hosp-Admission (Discharged) from 11/22/2020 in [facility] Assessment [facility]  C-SSRS RISK CATEGORY High Risk No Risk No Risk        Musculoskeletal  Strength & Muscle Tone: within normal limits Gait & Station: normal Patient leans: N/A  Psychiatric Specialty Exam  Presentation  General Appearance: Appropriate for Environment; Casual  Eye Contact:Good  Speech:Clear and Coherent; Normal Rate  Speech Volume:Normal  Handedness:Right   Mood and Affect  Mood:Depressed; Hopeless  Affect:Congruent; Depressed; Tearful   Thought Process  Thought Processes:Coherent; Goal Directed; Linear  Descriptions of  Associations:Intact  Orientation:Full (Time, Place and Person)  Thought Content:Logical; WDL     Hallucinations:Hallucinations: None  Ideas of Reference:None  Suicidal Thoughts:Suicidal Thoughts: No  Homicidal Thoughts:Homicidal Thoughts: No   Sensorium  Memory:Immediate Good; Recent Good; Remote Fair  Judgment:Fair  Insight:Present   Executive Functions  Concentration:Good  Attention Span:Good  [place] of Knowledge:Good  Language:Good   Psychomotor Activity  Psychomotor Activity:Psychomotor Activity: Normal   Assets  Assets:Communication Skills; Desire for Improvement; Resilience; Physical Health   Sleep  Sleep:Sleep: Fair   Nutritional Assessment (For OBS and FBC admissions only) Has the patient had a weight loss or gain of 10 pounds or more in the  last 3 months?: -- (Patient states she is unsure of specific amount of weight gain.) Has the patient had a decrease in food intake/or appetite?: No Does the patient have dental problems?: No Does the patient have eating habits or behaviors that may be indicators of an eating disorder including binging or inducing vomiting?: No Has the patient recently lost weight without trying?: 0 Has the patient been eating poorly because of a decreased appetite?: 0 Malnutrition Screening Tool Score: 0    Physical Exam  Physical Exam Vitals and nursing note reviewed.  Constitutional:      Appearance: Normal appearance. She is well-developed.  HENT:     Head: Normocephalic and atraumatic.     Nose: Nose normal.  Cardiovascular:     Rate and Rhythm: Normal rate.  Pulmonary:     Effort: Pulmonary effort is normal.  Musculoskeletal:        General: Normal range of motion.     Cervical back: Normal range of motion.  Skin:    General: Skin is warm and dry.  Neurological:     Mental Status: She is alert and oriented to person, place, and time.  Psychiatric:        Attention and Perception: Attention and  perception normal.        Mood and Affect: Mood is depressed. Affect is tearful.        Speech: Speech normal.        Behavior: Behavior normal. Behavior is cooperative.        Thought Content: Thought content normal.        Cognition and Memory: Cognition and memory normal.   Review of Systems  Constitutional: Negative.   HENT: Negative.    Eyes: Negative.   Respiratory: Negative.    Cardiovascular: Negative.   Gastrointestinal: Negative.   Genitourinary: Negative.   Musculoskeletal: Negative.   Skin: Negative.   Neurological: Negative.   Endo/Heme/Allergies: Negative.   Psychiatric/Behavioral:  Positive for depression.   Blood pressure 104/64, pulse 69, temperature 98 F (36.7 C), temperature source Oral, resp. rate 14, last menstrual period 09/23/2020, SpO2 99 %, unknown if currently breastfeeding. There is no height or weight on file to calculate BMI.  Treatment Plan Summary: Daily contact with patient to assess and evaluate symptoms and progress in treatment Continue to recommend inpatient psychiatric treatment. Patient reviewed with Dr. Serafina Mitchell. Current medications include: -Sertraline 150 mg daily -Hydroxyzine 25 mg 3 times daily as needed/anxiety    Lucky Rathke, FNP 02/28/2021 2:28 PM

## 2021-02-28 NOTE — ED Notes (Addendum)
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 Pt stated she had an episode yesterday but she has never fallen or injured herself. She states she feel it coming on and sit down immediately. States prior to yesterday, she had an episode in Dec. States "it all depends on how stressed I get but I always know when it is going to start. It starts in my eyes". She don't have an assistive devices at home to aid in ambulation. Dr. Elberta Leatherwood, MD @[facility]  was notified of this information via secure chat.

## 2021-02-28 NOTE — ED Notes (Signed)
Report called to [place], RN @ [facility]. Safe transport called for tranport

## 2021-03-01 DIAGNOSIS — F332 Major depressive disorder, recurrent severe without psychotic features: Principal | ICD-10-CM

## 2021-03-01 MED ORDER — SERTRALINE HCL 100 MG PO TABS
100.0000 mg | ORAL_TABLET | Freq: Every day | ORAL | Status: DC
  Administered 2021-03-01: 100 mg via ORAL
  Filled 2021-03-01: qty 1

## 2021-03-01 MED ORDER — BUPROPION HCL ER (XL) 150 MG PO TB24
150.0000 mg | ORAL_TABLET | Freq: Every day | ORAL | Status: DC
  Administered 2021-03-01 – 2021-03-02 (×2): 150 mg via ORAL
  Filled 2021-03-01 (×2): qty 1

## 2021-03-01 NOTE — [facility] Suicide Risk Assessment (Signed)
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  26 year old woman transferred to our hospital for psychiatric treatment for depression with suicidal ideation.  Recent suicide attempt by strangulation.  Continues to be tearful sad and negative but not hopeless.  Cooperative with treatment planning denies any acute suicidal intent.  No homicidal ideation  Continued Clinical Symptoms:  Alcohol Use Disorder Identification Test Final Score (AUDIT): 2 The "Alcohol Use Disorders Identification Test", Guidelines for Use in Primary Care, Second Edition.  World Science writer [facility]). Score between 0-7:  no or low risk or alcohol related problems. Score between 8-15:  moderate risk of alcohol related problems. Score between 16-19:  high risk of alcohol related problems. Score 20 or above:  warrants further diagnostic evaluation for alcohol dependence and treatment.   CLINICAL FACTORS:   Depression:   Anhedonia Impulsivity   Musculoskeletal: Strength & Muscle Tone: within normal limits Gait & Station: normal Patient leans: N/A  Psychiatric Specialty Exam:  Presentation  General Appearance: Appropriate for Environment; Casual  Eye Contact:Good  Speech:Clear and Coherent; Normal Rate  Speech  Volume:Normal  Handedness:Right   Mood and Affect  Mood:Depressed; Hopeless  Affect:Congruent; Depressed; Tearful   Thought Process  Thought Processes:Coherent; Goal Directed; Linear  Descriptions of Associations:Intact  Orientation:Full (Time, Place and Person)  Thought Content:Logical; WDL  History of Schizophrenia/Schizoaffective disorder:No data recorded Duration of Psychotic Symptoms:No data recorded Hallucinations:Hallucinations: None  Ideas of Reference:None  Suicidal Thoughts:Suicidal Thoughts: No  Homicidal Thoughts:Homicidal Thoughts: No   Sensorium  Memory:Immediate Good; Recent Good; Remote Fair  Judgment:Fair  Insight:Present   Executive Functions  Concentration:Good  Attention Span:Good  Recall:Good  Fund of Knowledge:Good  Language:Good   Psychomotor Activity  Psychomotor Activity:Psychomotor Activity: Normal   Assets  Assets:Communication Skills; Desire for Improvement; Resilience; Physical Health   Sleep  Sleep:Sleep: Fair    Physical Exam: Physical Exam Vitals and nursing note reviewed.  Constitutional:      Appearance: Normal appearance.  HENT:     Head: Normocephalic and atraumatic.     Mouth/Throat:     Pharynx: Oropharynx is clear.  Eyes:     Pupils: Pupils are equal, round, and reactive to light.  Cardiovascular:     Rate and Rhythm: Normal rate and regular rhythm.  Pulmonary:     Effort: Pulmonary effort is normal.     Breath sounds: Normal breath sounds.  Abdominal:     General: Abdomen is flat.     Palpations: Abdomen is soft.  Musculoskeletal:        General: Normal range of motion.  Skin:    General: Skin is warm and dry.  Neurological:     General: No focal deficit present.     Mental Status: She is alert. Mental status is at baseline.  Psychiatric:        Attention and Perception: Attention normal.        Mood and Affect: Mood is depressed. Affect is tearful.  Speech: Speech normal.         Behavior: Behavior is cooperative.        Thought Content: Thought content normal.        Cognition and Memory: Cognition normal.        Judgment: Judgment is impulsive.   Review of Systems  Constitutional: Negative.   HENT: Negative.    Eyes: Negative.   Respiratory: Negative.    Cardiovascular: Negative.   Gastrointestinal: Negative.   Musculoskeletal: Negative.   Skin: Negative.   Neurological: Negative.   Psychiatric/Behavioral:  Positive for depression and suicidal ideas. Negative for hallucinations, memory loss and substance abuse. The patient is nervous/anxious. The patient does not have insomnia.   Blood pressure 138/87, pulse 78, temperature 97.9 F (36.6 C), temperature source Oral, resp. rate 17, height 5\' 5"  (1.651 m), weight 102.5 kg, last menstrual period 09/23/2020, SpO2 98 %, unknown if currently breastfeeding. Body mass index is 37.61 kg/m.   COGNITIVE FEATURES THAT CONTRIBUTE TO RISK:  None    SUICIDE RISK:   Mild:  Suicidal ideation of limited frequency, intensity, duration, and specificity.  There are no identifiable plans, no associated intent, mild dysphoria and related symptoms, good self-control (both objective and subjective assessment), few other risk factors, and identifiable protective factors, including available and accessible social support.  PLAN OF CARE: Continue 15-minute checks.  Initiate treatment for depression.  Engage in individual and group therapy.  Reassess dangerousness prior to discharge  I certify that inpatient services furnished can reasonably be expected to improve the patient's condition.   09/25/2020, MD 03/01/2021, 4:33 PM

## 2021-03-01 NOTE — Progress Notes (Signed)
°   03/01/21 1330  Clinical Encounter Type  Visited With Patient not available  Referral From Physician  Consult/Referral To Chaplain  Spiritual Encounters  Spiritual Needs Other (Comment) (not assessed)   Chaplain Burris attempted to visit but Pt is having a nap. Will return to f/u later today 1/19. [facility] Sophronia Simas is the on-call chaplain tonight [phone])

## 2021-03-01 NOTE — H&P (Signed)
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  This is a 26 year old woman transferred to our hospital from [facility] where she was brought after a suicide attempt by wrapping tape around her neck.  Grandmother found her and cut the tape off.  Patient describes longstanding chronic depression.  Mood feels sad and down and negative most of the time.  She describes a lifelong feeling of feeling like everyone in her family values everyone else except for her.  Particularly feels rejected by her mother.  Also has a husband who cheated on her leaving her now with the 2 children living with her grandmother.  Mood is sad most of the time.  Energy level low and negative.  Tearful much of the time.  Increased fatigue.  Denies any auditory or visual hallucinations.  Does not describe any psychotic symptoms.  Patient recently wrapped tape around her neck out of a sense of despair and hopelessness but her grandmother cut the tape off after finding her.  Patient takes sertraline 150 mg a day which has been prescribed by her primary care doctor but does not get any other mental health care.  She has been diagnosed with narcolepsy but is not getting any treatment for that.  Denies homicidal ideation.  Denies alcohol or drug abuse Associated Signs/Symptoms: Depression Symptoms:  depressed mood, anhedonia, hypersomnia, psychomotor retardation, fatigue, difficulty concentrating, suicidal attempt, Duration of Depression Symptoms: Greater than two weeks  (Hypo)  Manic Symptoms:  Impulsivity, Anxiety Symptoms:  Excessive Worry, Psychotic Symptoms:   None PTSD Symptoms: Chronic depression and anxiety Total Time spent with patient: 1 hour  Past Psychiatric History: Patient has no previous hospitalizations.  Denies any past suicide attempts.  Has been on sertraline prescribed by primary care doctor but not had any other mental health treatment.  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  No history of mania.  No history of substance abuse.  Is the patient at risk to self? Yes.    Has the patient been a risk to self in the past 6 months? Yes.    Has the patient been a risk to self within the distant past? No.  Is the patient a risk to others? No.  Has the patient been a risk to others in the past 6 months? No.  Has the patient been a risk to others within the distant past? No.   Prior Inpatient Therapy:   Prior Outpatient Therapy:    Alcohol Screening: 1. How often do you have a drink containing alcohol?: Monthly or less 2. How many drinks containing alcohol do you have on a typical day when you are drinking?: 1 or 2 3. How often do you have six or more drinks on one occasion?: Less than monthly AUDIT-C Score: 2 4. How often during the last year have you found that you were not able to stop drinking once you had started?: Never 5. How often during the last year have you failed to do what was normally expected from you because of drinking?: Never 6. How often during the last year have  you needed a first drink in the morning to get yourself going after a heavy drinking session?: Never 7. How often during the last year have you had a feeling of guilt of remorse after drinking?: Never 8. How often during the last year have you been unable to remember what happened the night before because you had been drinking?: Never 9. Have you or someone else been injured as a result of your  drinking?: No 10. Has a relative or friend or a doctor or another health worker been concerned about your drinking or suggested you cut down?: No Alcohol Use Disorder Identification Test Final Score (AUDIT): 2 Substance Abuse History in the last 12 months:  No. 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, also been evaluated by neurology   UTI (urinary tract infection)     Past Surgical History:  Procedure Laterality Date   NO PAST SURGERIES     Family History:  Family History  Problem Relation Age of Onset   Depression Mother    Healthy Brother    Healthy Sister    Hypertension Maternal Grandmother    Depression Maternal Grandmother    Asthma Maternal Grandmother    Diabetes Maternal Grandmother    Heart disease Maternal Grandmother    Hearing loss Maternal Grandfather    COPD Maternal Grandfather    Family Psychiatric  History: Patient reports several members of her family with depression but no suicide in the family Tobacco Screening:   Social History:  Social History   Substance and Sexual Activity  Alcohol Use No   Alcohol/week: 0.0 standard drinks     Social History   Substance and Sexual Activity  Drug Use No    Additional Social History:                           Allergies:   Allergies  Allergen Reactions   Corn-Containing Products Anaphylaxis and Swelling    Pt states that it is the silk that she is allergic to.   Percocet [Oxycodone-Acetaminophen] Anaphylaxis and Other (See Comments)    States feels like chest is tightening up.   Other Itching and Nausea And Vomiting    [place]   Lab  Results:  Results for orders placed or performed during the hospital encounter of 02/27/21 (from the past 48 hour(s))  POC SARS Coronavirus 2 Ag-ED - Nasal Swab     Status: Normal (Preliminary result)   Collection Time: 02/28/21 12:23 AM  Result Value Ref Range   SARS Coronavirus 2 Ag Negative Negative  POCT Urine Drug Screen - (IC[facility])     Status: Abnormal   Collection Time: 02/28/21 12:23 AM  Result Value Ref Range   POC Amphetamine UR None Detected NONE DETECTED (Cut Off Level 1000 ng/mL)   POC Secobarbital (BAR) None Detected NONE DETECTED (Cut Off Level 300 ng/mL)   POC Buprenorphine (BUP) None Detected NONE DETECTED (Cut Off Level 10 ng/mL)   POC Oxazepam (BZO) None Detected NONE DETECTED (Cut Off Level 300 ng/mL)   POC Cocaine UR None Detected NONE DETECTED (Cut Off Level  300 ng/mL)   POC Methamphetamine UR None Detected NONE DETECTED (Cut Off Level 1000 ng/mL)   POC Morphine None Detected NONE DETECTED (Cut Off Level 300 ng/mL)   POC Oxycodone UR None Detected NONE DETECTED (Cut Off Level 100 ng/mL)   POC Methadone UR None Detected NONE DETECTED (Cut Off Level 300 ng/mL)   POC Marijuana UR Positive (A) NONE DETECTED (Cut Off Level 50 ng/mL)  CBC with Differential/Platelet     Status: Abnormal   Collection Time: 02/28/21 12:24 AM  Result Value Ref Range   WBC 11.1 (H) 4.0 - 10.5 K/uL   RBC 4.72 3.87 - 5.11 MIL/uL   Hemoglobin 13.1 12.0 - 15.0 g/dL   HCT 16.139.1 09.636.0 - 04.546.0 %   MCV 82.8 80.0 - 100.0 fL   MCH 27.8 26.0 - 34.0 pg   MCHC 33.5 30.0 - 36.0 g/dL   RDW 40.912.4 81.111.5 - 91.415.5 %   Platelets 293 150 - 400 K/uL   nRBC 0.0 0.0 - 0.2 %   Neutrophils Relative % 57 %   Neutro Abs 6.4 1.7 - 7.7 K/uL   Lymphocytes Relative 33 %   Lymphs Abs 3.6 0.7 - 4.0 K/uL   Monocytes Relative 7 %   Monocytes Absolute 0.7 0.1 - 1.0 K/uL   Eosinophils Relative 1 %   Eosinophils Absolute 0.2 0.0 - 0.5 K/uL   Basophils Relative 1 %   Basophils Absolute 0.1 0.0 - 0.1 K/uL   Immature Granulocytes 1  %   Abs Immature Granulocytes 0.05 0.00 - 0.07 K/uL    Comment: Performed at [facility], [address] [place], [place] [address]  Comprehensive metabolic panel     Status: Abnormal   Collection Time: 02/28/21 12:24 AM  Result Value Ref Range   Sodium 141 135 - 145 mmol/L   Potassium 4.2 3.5 - 5.1 mmol/L   Chloride 107 98 - 111 mmol/L   CO2 24 22 - 32 mmol/L   Glucose, Bld 91 70 - 99 mg/dL    Comment: Glucose reference range applies only to samples taken after fasting for at least 8 hours.   BUN 11 6 - 20 mg/dL   Creatinine, Ser 6.210.78 0.44 - 1.00 mg/dL   Calcium 9.8 8.9 - 30.810.3 mg/dL   Total Protein 7.9 6.5 - 8.1 g/dL   Albumin 4.1 3.5 - 5.0 g/dL   AST 32 15 - 41 U/L   ALT 67 (H) 0 - 44 U/L   Alkaline Phosphatase 55 38 - 126 U/L   Total Bilirubin 0.5 0.3 - 1.2 mg/dL   GFR, Estimated >65>60 >78>60 mL/min    Comment: (NOTE) Calculated using the CKD-EPI Creatinine Equation (2021)    Anion gap 10 5 - 15    Comment: Performed at [facility], [address] [place], [place] [address]  Ethanol     Status: None   Collection Time: 02/28/21 12:24 AM  Result Value Ref Range   Alcohol, Ethyl (B) <10 <10 mg/dL    Comment: (NOTE) Lowest detectable limit for serum alcohol is 10 mg/dL.  For medical purposes only. Performed at [facility], [address] [place], [place] [address]   Hemoglobin A1c     Status: None   Collection Time: 02/28/21 12:24 AM  Result Value Ref Range   Hgb A1c MFr Bld 5.0 4.8 - 5.6 %    Comment: (NOTE) Pre diabetes:          5.7%-6.4%  Diabetes:              >  6.4%  Glycemic control for   <7.0% adults with diabetes    Mean Plasma Glucose 96.8 mg/dL    Comment: Performed at [facility], [address] [place], [place] [address]  Lipid panel     Status: Abnormal   Collection Time: 02/28/21 12:24 AM  Result Value Ref Range   Cholesterol 229 (H) 0 - 200 mg/dL   Triglycerides 947 (H) <150 mg/dL   HDL 45 >65 mg/dL   Total CHOL/HDL  Ratio 5.1 RATIO   VLDL 43 (H) 0 - 40 mg/dL   LDL Cholesterol 465 (H) 0 - 99 mg/dL    Comment:        Total Cholesterol/HDL:CHD Risk Coronary Heart Disease Risk Table                     Men   Women  1/2 Average Risk   3.4   3.3  Average Risk       5.0   4.4  2 X Average Risk   9.6   7.1  3 X Average Risk  23.4   11.0        Use the calculated Patient Ratio above and the CHD Risk Table to determine the patient's CHD Risk.        ATP III CLASSIFICATION (LDL):  <100     mg/dL   Optimal  035-465  mg/dL   Near or Above                    Optimal  130-159  mg/dL   Borderline  681-275  mg/dL   High  >170     mg/dL   Very High Performed at [facility], [address] [place], [place] [address]   TSH     Status: None   Collection Time: 02/28/21 12:24 AM  Result Value Ref Range   TSH 2.581 0.350 - 4.500 uIU/mL    Comment: Performed by a 3rd Generation assay with a functional sensitivity of <=0.01 uIU/mL. Performed at [facility], [address] [place], [place] [address]   Resp Panel by RT-PCR (Flu A&B, Covid) Nasopharyngeal Swab     Status: None   Collection Time: 02/28/21 12:25 AM   Specimen: Nasopharyngeal Swab; Nasopharyngeal(NP) swabs in vial transport medium  Result Value Ref Range   SARS Coronavirus 2 by RT PCR NEGATIVE NEGATIVE    Comment: (NOTE) SARS-CoV-2 target nucleic acids are NOT DETECTED.  The SARS-CoV-2 RNA is generally detectable in upper respiratory specimens during the acute phase of infection. The lowest concentration of SARS-CoV-2 viral copies this assay can detect is 138 copies/mL. A negative result does not preclude SARS-Cov-2 infection and should not be used as the sole basis for treatment or other patient management decisions. A negative result may occur with  improper specimen collection/handling, submission of specimen other than nasopharyngeal swab, presence of viral mutation(s) within the areas targeted by this assay, and inadequate  number of viral copies(<138 copies/mL). A negative result must be combined with clinical observations, patient history, and epidemiological information. The expected result is Negative.  Fact Sheet for Patients:  BloggerCourse.com  Fact Sheet for Healthcare Providers:  SeriousBroker.it  This test is no t yet approved or cleared by the [place] FDA and  has been authorized for detection and/or diagnosis of SARS-CoV-2 by FDA under an Emergency Use Authorization (EUA). This EUA will remain  in effect (meaning this test can be used) for the  duration of the COVID-19 declaration under Section 564(b)(1) of the Act, 21 U.S.C.section 360bbb-3(b)(1), unless the authorization is terminated  or revoked sooner.       Influenza A by PCR NEGATIVE NEGATIVE   Influenza B by PCR NEGATIVE NEGATIVE    Comment: (NOTE) The Xpert Xpress SARS-CoV-2/FLU/RSV plus assay is intended as an aid in the diagnosis of influenza from Nasopharyngeal swab specimens and should not be used as a sole basis for treatment. Nasal washings and aspirates are unacceptable for Xpert Xpress SARS-CoV-2/FLU/RSV testing.  Fact Sheet for Patients: BloggerCourse.com  Fact Sheet for Healthcare Providers: SeriousBroker.it  This test is not yet approved or cleared by the [place] FDA and has been authorized for detection and/or diagnosis of SARS-CoV-2 by FDA under an Emergency Use Authorization (EUA). This EUA will remain in effect (meaning this test can be used) for the duration of the COVID-19 declaration under Section 564(b)(1) of the Act, 21 U.S.C. section 360bbb-3(b)(1), unless the authorization is terminated or revoked.  Performed at [facility], [address], [place], [place] [address]   Pregnancy, urine POC     Status: None   Collection Time: 02/28/21 12:32 AM  Result Value Ref Range   Preg Test, Ur  NEGATIVE NEGATIVE    Comment:        THE SENSITIVITY OF THIS METHODOLOGY IS >24 mIU/mL   POC SARS Coronavirus 2 Ag     Status: None   Collection Time: 02/28/21 12:36 AM  Result Value Ref Range   SARSCOV2ONAVIRUS 2 AG NEGATIVE NEGATIVE    Comment: (NOTE) SARS-CoV-2 antigen NOT DETECTED.   Negative results are presumptive.  Negative results do not preclude SARS-CoV-2 infection and should not be used as the sole basis for treatment or other patient management decisions, including infection  control decisions, particularly in the presence of clinical signs and  symptoms consistent with COVID-19, or in those who have been in contact with the virus.  Negative results must be combined with clinical observations, patient history, and epidemiological information. The expected result is Negative.  Fact Sheet for Patients: https://www.jennings-kim.com/  Fact Sheet for Healthcare Providers: https://alexander-rogers.biz/  This test is not yet approved or cleared by the [place] FDA and  has been authorized for detection and/or diagnosis of SARS-CoV-2 by FDA under an Emergency Use Authorization (EUA).  This EUA will remain in effect (meaning this test can be used) for the duration of  the COV ID-19 declaration under Section 564(b)(1) of the Act, 21 U.S.C. section 360bbb-3(b)(1), unless the authorization is terminated or revoked sooner.      Blood Alcohol level:  Lab Results  Component Value Date   ETH <10 02/28/2021    Metabolic Disorder Labs:  Lab Results  Component Value Date   HGBA1C 5.0 02/28/2021   MPG 96.8 02/28/2021   No results found for: PROLACTIN Lab Results  Component Value Date   CHOL 229 (H) 02/28/2021   TRIG 214 (H) 02/28/2021   HDL 45 02/28/2021   CHOLHDL 5.1 02/28/2021   VLDL 43 (H) 02/28/2021   LDLCALC 141 (H) 02/28/2021    Current Medications: Current Facility-Administered Medications  Medication Dose Route Frequency  Provider Last Rate Last Admin   alum & mag hydroxide-simeth (MAALOX/MYLANTA) 200-200-20 MG/5ML suspension 30 mL  30 mL Oral Q4H PRN Dequincy Born T, MD       buPROPion (WELLBUTRIN XL) 24 hr tablet 150 mg  150 mg Oral Daily Famous Eisenhardt, Jackquline Denmark, MD   150 mg at 03/01/21  1157   hydrOXYzine (ATARAX) tablet 25 mg  25 mg Oral TID PRN Jabez Molner, Jackquline Denmark, MD       magnesium hydroxide (MILK OF MAGNESIA) suspension 30 mL  30 mL Oral Daily PRN Jenna Routzahn T, MD       sertraline (ZOLOFT) tablet 100 mg  100 mg Oral QHS Franciscojavier Wronski T, MD       traZODone (DESYREL) tablet 100 mg  100 mg Oral QHS PRN Marlowe Lawes T, MD   100 mg at 02/28/21 2116   PTA Medications: Medications Prior to Admission  Medication Sig Dispense Refill Last Dose   sertraline (ZOLOFT) 50 MG tablet Take 1 tablet (50 mg total) by mouth daily. (Patient taking differently: Take 100 mg by mouth at bedtime.) 90 tablet 1     Musculoskeletal: Strength & Muscle Tone: within normal limits Gait & Station: normal Patient leans: N/A            Psychiatric Specialty Exam:  Presentation  General Appearance: Appropriate for Environment; Casual  Eye Contact:Good  Speech:Clear and Coherent; Normal Rate  Speech Volume:Normal  Handedness:Right   Mood and Affect  Mood:Depressed; Hopeless  Affect:Congruent; Depressed; Tearful   Thought Process  Thought Processes:Coherent; Goal Directed; Linear  Duration of Psychotic Symptoms: No data recorded Past Diagnosis of Schizophrenia or Psychoactive disorder: No data recorded Descriptions of Associations:Intact  Orientation:Full (Time, Place and Person)  Thought Content:Logical; WDL  Hallucinations:Hallucinations: None  Ideas of Reference:None  Suicidal Thoughts:Suicidal Thoughts: No  Homicidal Thoughts:Homicidal Thoughts: No   Sensorium  Memory:Immediate Good; Recent Good; Remote Fair  Judgment:Fair  Insight:Present   Executive Functions   Concentration:Good  Attention Span:Good  Recall:Good  Fund of Knowledge:Good  Language:Good   Psychomotor Activity  Psychomotor Activity:Psychomotor Activity: Normal   Assets  Assets:Communication Skills; Desire for Improvement; Resilience; Physical Health   Sleep  Sleep:Sleep: Fair    Physical Exam: Physical Exam Vitals and nursing note reviewed.  Constitutional:      Appearance: Normal appearance.  HENT:     Head: Normocephalic and atraumatic.     Mouth/Throat:     Pharynx: Oropharynx is clear.  Eyes:     Pupils: Pupils are equal, round, and reactive to light.  Cardiovascular:     Rate and Rhythm: Normal rate and regular rhythm.  Pulmonary:     Effort: Pulmonary effort is normal.     Breath sounds: Normal breath sounds.  Abdominal:     General: Abdomen is flat.     Palpations: Abdomen is soft.  Musculoskeletal:        General: Normal range of motion.  Skin:    General: Skin is warm and dry.  Neurological:     General: No focal deficit present.     Mental Status: She is alert. Mental status is at baseline.  Psychiatric:        Attention and Perception: Attention normal.        Mood and Affect: Mood is depressed. Affect is tearful.        Speech: Speech normal.        Thought Content: Thought content normal.        Cognition and Memory: Cognition normal.        Judgment: Judgment is impulsive.   Review of Systems  Constitutional: Negative.   HENT: Negative.    Eyes: Negative.   Respiratory: Negative.    Cardiovascular: Negative.   Gastrointestinal: Negative.   Musculoskeletal: Negative.   Skin: Negative.   Neurological: Negative.  Psychiatric/Behavioral:  Positive for depression and suicidal ideas. Negative for substance abuse. The patient is nervous/anxious.   Blood pressure 138/87, pulse 78, temperature 97.9 F (36.6 C), temperature source Oral, resp. rate 17, height 5\' 5"  (1.651 m), weight 102.5 kg, last menstrual period 09/23/2020, SpO2 98  %, unknown if currently breastfeeding. Body mass index is 37.61 kg/m.  Treatment Plan Summary: 26 year old woman with severe major depression recurrent without psychotic features.  Recent suicide attempt.  Currently cooperative with treatment and denies any current wish to die.  Able to articulate positive feelings towards her children and grandmother and some hopes for the future.  Patient has been on the same antidepressant for 2 years without benefit.  Recommend gradually tapering sertraline while beginning a different antidepressant which I suggested could be bupropion.  Begin 150 mg/day.  Additionally patient not receiving any treatment for narcolepsy.  Bupropion potentially could be of some benefit for this as well.  We will reassess the need for additional medicine prior to discharge.  Patient has appropriate insurance and would be a good candidate for therapy and outpatient psychiatric care to which she will be referred after discharge.  Ongoing 15-minute checks for now.  Treatment team meeting tomorrow Observation Level/Precautions:  15 minute checks  Laboratory:  HbAIC  Psychotherapy:    Medications:    Consultations:    Discharge Concerns:    Estimated LOS:  Other:     Physician Treatment Plan for Primary Diagnosis: Severe recurrent major depression without psychotic features (HCC) Long Term Goal(s): Improvement in symptoms so as ready for discharge  Short Term Goals: Ability to identify changes in lifestyle to reduce recurrence of condition will improve, Ability to verbalize feelings will improve, and Ability to disclose and discuss suicidal ideas  Physician Treatment Plan for Secondary Diagnosis: Principal Problem:   Severe recurrent major depression without psychotic features (HCC) Active Problems:   Narcolepsy  Long Term Goal(s): Improvement in symptoms so as ready for discharge  Short Term Goals: Compliance with prescribed medications will improve  I certify that inpatient  services furnished can reasonably be expected to improve the patient's condition.    Mordecai Rasmussen, MD 1/19/20234:35 PM

## 2021-03-01 NOTE — Progress Notes (Signed)
D: Pt alert and oriented. Pt denies experiencing any anxiety/depression at this time. Pt denies experiencing any pain at this time. Pt denies experiencing any SI/HI, or AVH at this time.    A: Scheduled medications administered to pt, per MD orders. Support and encouragement provided. Frequent verbal contact made. Routine safety checks conducted q15 minutes.   R: No adverse drug reactions noted. Pt verbally contracts for safety at this time. Pt complaint with medications. Pt interacts minimally with others on the unit. Pt remains safe at this time. Will continue to monitor.  

## 2021-03-01 NOTE — Progress Notes (Signed)
°   03/01/21 1710  Clinical Encounter Type  Visited With Patient  Visit Type Initial;Spiritual support;Social support  Referral From Physician  Consult/Referral To [facility] [place] met with Pt in her room. Chaplain B provided a non-anxious, compassionate presence. Focus on allowing Pt to tell her story, so the visit was unhurried and lengthy. Chaplain B provided active and reflective listening, noting aspects of Pt's story that indicate sources of strength and lifting up these positive attributes. Pt states she is feeling relief with new medication and we discussed the benefit of this combined with other strategies to cope and to heal from past and current traumas. Chaplain B also discussed the nature of disenfranchised grief with recent pregnancy when Pt voluntarily disclosed the loss. Again, focus was on allowing Pt to talk through her experiences without judgment and this seemed beneficial.

## 2021-03-01 NOTE — Group Note (Signed)
[facility] LCSW Group Therapy Note   Group Date: 03/01/2021 Start Time: 1300 End Time: 1400   Type of Therapy/Topic:  Group Therapy:  Balance in Life  Participation Level:  Active   Description of Group:   This group will address the concept of balance and how it feels and looks when one is unbalanced. Patients will be encouraged to process areas in their lives that are out of balance, and identify reasons for remaining unbalanced. Facilitators will guide patients utilizing problem- solving interventions to address and correct the stressor making their life unbalanced. Understanding and applying boundaries will be explored and addressed for obtaining  and maintaining a balanced life. Patients will be encouraged to explore ways to assertively make their unbalanced needs known to significant others in their lives, using other group members and facilitator for support and feedback.  Therapeutic Goals: Patient will identify two or more emotions or situations they have that consume much of in their lives. Patient will identify signs/triggers that life has become out of balance:  Patient will identify two ways to set boundaries in order to achieve balance in their lives:  Patient will demonstrate ability to communicate their needs through discussion and/or role plays  Summary of Patient Progress: Patient was present for the entirety of the group session. Patient was an active listener and participated in the topic of discussion, provided helpful advice to others, and added nuance to topic of conversation. Patient states that she needs to find balance with regards to her children, spending time with her children v. spending time on herself.     Therapeutic Modalities:   Cognitive Behavioral Therapy Solution-Focused Therapy Assertiveness Training   Jacqulyn Ducking [place], [place]

## 2021-03-01 NOTE — Progress Notes (Signed)
Patient calm and compliant during assessment. Pt endorses depression, denies SI/HI/AVH and anxiety. Patient observed interacting appropriately with staff and peers on the unit. Pt compliant with medication administration per MD orders. Pt given education, support, and encouragement to be active in her treatment plan. Pt being monitored Q 15 minutes for safety per unit protocol. Pt remains safe on the unit  °

## 2021-03-01 NOTE — Progress Notes (Signed)
Recreation Therapy Notes  Date: 03/01/2021  Time: 10:15am    Location: Courtyard     Behavioral response: N/A   Intervention Topic: Leisure    Discussion/Intervention: Patient did not attend group.   Clinical Observations/Feedback:  Patient did not attend group.    Ricky Gallery LRT/CTRS          Kate Sweetman 03/01/2021 11:54 AM

## 2021-03-02 MED ORDER — BUPROPION HCL ER (XL) 150 MG PO TB24: 300.0000 mg | ORAL_TABLET | Freq: Every day | ORAL | Status: DC

## 2021-03-02 MED ORDER — TRAZODONE HCL 100 MG PO TABS: 100.0000 mg | ORAL_TABLET | Freq: Every evening | ORAL | 1 refills | Status: DC | PRN

## 2021-03-02 MED ORDER — BUPROPION HCL ER (XL) 300 MG PO TB24: 300.0000 mg | ORAL_TABLET | Freq: Every day | ORAL | 1 refills | Status: DC

## 2021-03-02 MED ORDER — SERTRALINE HCL 100 MG PO TABS: 100.0000 mg | ORAL_TABLET | Freq: Every day | ORAL | 1 refills | Status: DC

## 2021-03-02 NOTE — Progress Notes (Signed)
D: Pt alert and oriented. Pt rates depression 0/10, hopelessness 0/10, and anxiety 0/10. Pt goal: "To deal with my depression..." Pt reports energy level as normal and concentration. Pt reports sleep last night as being good. Pt did receive medications for sleep and did find them helpful. Pt denies experiencing any pain at this time. Pt denies experiencing any SI/HI, or AVH at this time.   A: Scheduled medications administered to pt, per MD orders. Support and encouragement provided. Frequent verbal contact made. Routine safety checks conducted q15 minutes.   R: No adverse drug reactions noted. Pt verbally contracts for safety at this time. Pt complaint with medications. Pt interacts minimally with others on the unit, mostly self isolating to her room with exception to meal and medication. Pt remains safe at this time. Will continue to monitor.

## 2021-03-02 NOTE — Discharge Summary (Signed)
Physician Discharge Summary Note  Patient:  Barbara [name] is an 26 y.o., female MRN:  [id number] DOB:  05/28/1995 Patient phone:  [phone] (home)  Patient address:   [address] [place] [id number],  Total Time spent with patient: 30 minutes  Date of Admission:  02/28/2021 Date of Discharge: 03/02/2021  Reason for Admission: Admitted after [place] with depression and self-injury possible suicide attempt wrapping tape around her neck  Principal Problem: Severe recurrent major depression without psychotic features [facility](HCC) Discharge Diagnoses: Principal Problem:   Severe recurrent major depression without psychotic features (HCC) Active Problems:   Narcolepsy   Past Psychiatric History: History of longstanding depressive symptoms with only minimal outpatient treatment and no prior suicide attempts  Past Medical History:  Past Medical History:  Diagnosis Date   Anxiety    Back pain    Depression    postpartum depression   Gestational diabetes    diet controlled   Hemiparesis (HCC) 08/31/2014   Occasional, evaluated by Neurology   Narcolepsy with cataplexy    Tied to emotions, also been evaluated by neurology   UTI (urinary tract infection)     Past Surgical History:  Procedure Laterality Date   NO PAST SURGERIES     Family History:  Family History  Problem Relation Age of Onset   Depression Mother    Healthy Brother    Healthy Sister    Hypertension Maternal Grandmother    Depression Maternal Grandmother    Asthma Maternal Grandmother    Diabetes Maternal Grandmother    Heart disease Maternal Grandmother    Hearing loss Maternal Grandfather    COPD Maternal Grandfather    Family Psychiatric  History: None reported Social History:  Social History   Substance and Sexual Activity  Alcohol Use No   Alcohol/week: 0.0 standard drinks     Social History   Substance and Sexual Activity  Drug Use No    Social History    Socioeconomic History   Marital status: Legally Separated    Spouse name: Not on file   Number of children: 1   Years of education: 11   Highest education level: Not on file  Occupational History   Occupation: unemployed  Tobacco Use   Smoking status: Never   Smokeless tobacco: Never  Vaping Use   Vaping Use: Never used  Substance and Sexual Activity   Alcohol use: No    Alcohol/week: 0.0 standard drinks   Drug use: No   Sexual activity: Not Currently    Partners: Male  Other Topics Concern   Not on file  Social History Narrative   Patient does not drink caffeine.   Patient is right handed.   Social Determinants of Health   Financial Resource Strain: Not on file  Food Insecurity: Not on file  Transportation Needs: Not on file  Physical Activity: Not on file  Stress: Not on file  Social Connections: Not on file    Hospital Course: Admitted to psychiatric unit.  15-minute checks continued.  Patient was appropriately engaged in individual and group assessment and therapy.  Showed good insight and participated in treatment.  Medications were changed by beginning bupropion initially 150 mg a day with plans to go to 300 mg a day.  Patient did not display any dangerous behavior while in the hospital.  She is showing good insight and is very agreeable to plans for outpatient treatment and completely denies any suicidal ideation.  Has a  safe place to stay.  Physical Findings: AIMS:  , ,  ,  ,    CIWA:    COWS:     Musculoskeletal: Strength & Muscle Tone: within normal limits Gait & Station: normal Patient leans: N/A   Psychiatric Specialty Exam:  Presentation  General Appearance: Appropriate for Environment; Casual  Eye Contact:Good  Speech:Clear and Coherent; Normal Rate  Speech Volume:Normal  Handedness:Right   Mood and Affect  Mood:Depressed; Hopeless  Affect:Congruent; Depressed; Tearful   Thought Process  Thought Processes:Coherent; Goal Directed;  Linear  Descriptions of Associations:Intact  Orientation:Full (Time, Place and Person)  Thought Content:Logical; WDL  History of Schizophrenia/Schizoaffective disorder:No data recorded Duration of Psychotic Symptoms:No data recorded Hallucinations:No data recorded Ideas of Reference:None  Suicidal Thoughts:No data recorded Homicidal Thoughts:No data recorded  Sensorium  Memory:Immediate Good; Recent Good; Remote Fair  Judgment:Fair  Insight:Present   Executive Functions  Concentration:Good  Attention Span:Good  Recall:Good  Fund of Knowledge:Good  Language:Good   Psychomotor Activity  Psychomotor Activity:No data recorded  Assets  Assets:Communication Skills; Desire for Improvement; Resilience; Physical Health   Sleep  Sleep:No data recorded   Physical Exam: Physical Exam Vitals and nursing note reviewed.  Constitutional:      Appearance: Normal appearance.  HENT:     Head: Normocephalic and atraumatic.     Mouth/Throat:     Pharynx: Oropharynx is clear.  Eyes:     Pupils: Pupils are equal, round, and reactive to light.  Cardiovascular:     Rate and Rhythm: Normal rate and regular rhythm.  Pulmonary:     Effort: Pulmonary effort is normal.     Breath sounds: Normal breath sounds.  Abdominal:     General: Abdomen is flat.     Palpations: Abdomen is soft.  Musculoskeletal:        General: Normal range of motion.  Skin:    General: Skin is warm and dry.  Neurological:     General: No focal deficit present.     Mental Status: She is alert. Mental status is at baseline.  Psychiatric:        Mood and Affect: Mood normal.        Thought Content: Thought content normal.   Review of Systems  Constitutional: Negative.   HENT: Negative.    Eyes: Negative.   Respiratory: Negative.    Cardiovascular: Negative.   Gastrointestinal: Negative.   Musculoskeletal: Negative.   Skin: Negative.   Neurological: Negative.   Psychiatric/Behavioral:  Negative.    Blood pressure 112/71, pulse 78, temperature 98.2 F (36.8 C), temperature source Oral, resp. rate 17, height 5\' 5"  (1.651 m), weight 102.5 kg, last menstrual period 09/23/2020, SpO2 98 %, unknown if currently breastfeeding. Body mass index is 37.61 kg/m.   Social History   Tobacco Use  Smoking Status Never  Smokeless Tobacco Never   Tobacco Cessation:  N/A, patient does not currently use tobacco products   Blood Alcohol level:  Lab Results  Component Value Date   ETH <10 02/28/2021    Metabolic Disorder Labs:  Lab Results  Component Value Date   HGBA1C 5.0 02/28/2021   MPG 96.8 02/28/2021   No results found for: PROLACTIN Lab Results  Component Value Date   CHOL 229 (H) 02/28/2021   TRIG 214 (H) 02/28/2021   HDL 45 02/28/2021   CHOLHDL 5.1 02/28/2021   VLDL 43 (H) 02/28/2021   LDLCALC 141 (H) 02/28/2021    See Psychiatric Specialty Exam and Suicide Risk Assessment completed by Attending  Physician prior to discharge.  Discharge destination:  Home  Is patient on multiple antipsychotic therapies at discharge:  No   Has Patient had three or more failed trials of antipsychotic monotherapy by history:  No  Recommended Plan for Multiple Antipsychotic Therapies: NA  Discharge Instructions     Diet - low sodium heart healthy   Complete by: As directed    Increase activity slowly   Complete by: As directed       Allergies as of 03/02/2021       Reactions   Corn-containing Products Anaphylaxis, Swelling   Pt states that it is the silk that she is allergic to.   Percocet [oxycodone-acetaminophen] Anaphylaxis, Other (See Comments)   States feels like chest is tightening up.   Other Itching, Nausea And Vomiting   [place]bert        Medication List     TAKE these medications      Indication  buPROPion 300 MG 24 hr tablet Commonly known as: WELLBUTRIN XL Take 1 tablet (300 mg total) by mouth daily. Start taking on: March 03, 2021   Indication: Major Depressive Disorder   sertraline 100 MG tablet Commonly known as: ZOLOFT Take 1 tablet (100 mg total) by mouth at bedtime. What changed:  medication strength how much to take when to take this  Indication: Major Depressive Disorder   traZODone 100 MG tablet Commonly known as: DESYREL Take 1 tablet (100 mg total) by mouth at bedtime as needed for sleep.  Indication: Trouble Sleeping         Follow-up recommendations: Recommend follow-up with local mental health therapist and provider.  Referrals given.  Start with primary care doctor.  Continue current medicine for now.  Comments: Prescriptions provided  Signed: Mordecai Rasmussen, MD 03/02/2021, 10:44 AM

## 2021-03-02 NOTE — BH IP Treatment Plan (Signed)
Interdisciplinary Treatment and Diagnostic Plan Update  03/02/2021 Time of Session: 9:30 AM Barbara [name] MRN: [id number]  Principal Diagnosis: Severe recurrent major depression without psychotic features [facility])  Secondary Diagnoses: Principal Problem:   Severe recurrent major depression without psychotic features (HCC) Active Problems:   Narcolepsy   Current Medications:  Current Facility-Administered Medications  Medication Dose Route Frequency Provider Last Rate Last Admin   alum & mag hydroxide-simeth (MAALOX/MYLANTA) 200-200-20 MG/5ML suspension 30 mL  30 mL Oral Q4H PRN Clapacs, Jackquline Denmark, MD       Melene Muller ON 03/03/2021] buPROPion (WELLBUTRIN XL) 24 hr tablet 300 mg  300 mg Oral Daily Clapacs, Jackquline Denmark, MD       hydrOXYzine (ATARAX) tablet 25 mg  25 mg Oral TID PRN Clapacs, Jackquline Denmark, MD       magnesium hydroxide (MILK OF MAGNESIA) suspension 30 mL  30 mL Oral Daily PRN Clapacs, John T, MD       sertraline (ZOLOFT) tablet 100 mg  100 mg Oral QHS Clapacs, John T, MD   100 mg at 03/01/21 2108   traZODone (DESYREL) tablet 100 mg  100 mg Oral QHS PRN Clapacs, John T, MD   100 mg at 03/01/21 2109   PTA Medications: Medications Prior to Admission  Medication Sig Dispense Refill Last Dose   sertraline (ZOLOFT) 50 MG tablet Take 1 tablet (50 mg total) by mouth daily. (Patient taking differently: Take 100 mg by mouth at bedtime.) 90 tablet 1     Patient Stressors: Health problems   Marital or family conflict   Medication change or noncompliance    Patient Strengths: Average or above average intelligence  Personnel officer means  Work skills   Treatment Modalities: Medication Management, Group therapy, Case management,  1 to 1 session with clinician, Psychoeducation, Recreational therapy.   Physician Treatment Plan for Primary Diagnosis: Severe recurrent major depression without psychotic features (HCC) Long Term Goal(s): Improvement in symptoms so as ready for discharge    Short Term Goals: Compliance with prescribed medications will improve Ability to identify changes in lifestyle to reduce recurrence of condition will improve Ability to verbalize feelings will improve Ability to disclose and discuss suicidal ideas  Medication Management: Evaluate patient's response, side effects, and tolerance of medication regimen.  Therapeutic Interventions: 1 to 1 sessions, Unit Group sessions and Medication administration.  Evaluation of Outcomes: Adequate for Discharge  Physician Treatment Plan for Secondary Diagnosis: Principal Problem:   Severe recurrent major depression without psychotic features (HCC) Active Problems:   Narcolepsy  Long Term Goal(s): Improvement in symptoms so as ready for discharge   Short Term Goals: Compliance with prescribed medications will improve Ability to identify changes in lifestyle to reduce recurrence of condition will improve Ability to verbalize feelings will improve Ability to disclose and discuss suicidal ideas     Medication Management: Evaluate patient's response, side effects, and tolerance of medication regimen.  Therapeutic Interventions: 1 to 1 sessions, Unit Group sessions and Medication administration.  Evaluation of Outcomes: Adequate for Discharge   RN Treatment Plan for Primary Diagnosis: Severe recurrent major depression without psychotic features (HCC) Long Term Goal(s): Knowledge of disease and therapeutic regimen to maintain health will improve  Short Term Goals: Ability to remain free from injury will improve, Ability to demonstrate self-control, Ability to participate in decision making will improve, Ability to verbalize feelings will improve, Ability to identify and develop effective coping behaviors will improve, and Compliance with prescribed medications will improve  Medication  Management: RN will administer medications as ordered by provider, will assess and evaluate patient's response and provide  education to patient for prescribed medication. RN will report any adverse and/or side effects to prescribing provider.  Therapeutic Interventions: 1 on 1 counseling sessions, Psychoeducation, Medication administration, Evaluate responses to treatment, Monitor vital signs and CBGs as ordered, Perform/monitor CIWA, COWS, AIMS and Fall Risk screenings as ordered, Perform wound care treatments as ordered.  Evaluation of Outcomes: Adequate for Discharge   LCSW Treatment Plan for Primary Diagnosis: Severe recurrent major depression without psychotic features (HCC) Long Term Goal(s): Safe transition to appropriate next level of care at discharge, Engage patient in therapeutic group addressing interpersonal concerns.  Short Term Goals: Engage patient in aftercare planning with referrals and resources, Increase social support, Increase ability to appropriately verbalize feelings, Increase emotional regulation, Facilitate acceptance of mental health diagnosis and concerns, and Facilitate patient progression through stages of change regarding substance use diagnoses and concerns  Therapeutic Interventions: Assess for all discharge needs, 1 to 1 time with Social worker, Explore available resources and support systems, Assess for adequacy in community support network, Educate family and significant other(s) on suicide prevention, Complete Psychosocial Assessment, Interpersonal group therapy.  Evaluation of Outcomes: Adequate for Discharge   Progress in Treatment: Attending groups: Yes. Participating in groups: Yes. Taking medication as prescribed: Yes. Toleration medication: Yes. Family/Significant other contact made: Yes, individual(s) contacted:  SPE completed with the patient.  Patient understands diagnosis: Yes. Discussing patient identified problems/goals with staff: Yes. Medical problems stabilized or resolved: Yes. Denies suicidal/homicidal ideation: Yes. Issues/concerns per patient  self-inventory: No. Other: none  New problem(s) identified: No, Describe:  none  New Short Term/Long Term Goal(s): medication management for mood stabilization; elimination of SI thoughts; development of comprehensive mental wellness/sobriety plan.   Patient Goals:  "to manage my depression and how to deal with my depression"  Discharge Plan or Barriers: Patient reports that she will return home. She reports that she will follow up with a provider in [facility].  CSW has provided paient with providers that accept her insurance.   Reason for Continuation of Hospitalization: Anxiety Depression Medication stabilization  Estimated Length of Stay:  1-7 days   Scribe for Treatment Team: Harden Mo, [place] 03/02/2021 12:16 PM

## 2021-03-02 NOTE — [facility] Suicide Risk Assessment (Signed)
[facility] INPATIENT:  Family/Significant Other Suicide Prevention Education  Suicide Prevention Education:  Patient Refusal for Family/Significant Other Suicide Prevention Education: The patient Barbara [name] has refused to provide written consent for family/significant other to be provided Family/Significant Other Suicide Prevention Education during admission and/or prior to discharge.  Physician notified.  SPE completed with pt, as pt refused to consent to family contact. SPI pamphlet provided to pt and pt was encouraged to share information with support network, ask questions, and talk about any concerns relating to SPE. Pt denies access to guns/firearms and verbalized understanding of information provided. Mobile Crisis information also provided to pt.   Harden Mo 03/02/2021, 11:53 AM

## 2021-03-02 NOTE — Progress Notes (Signed)
Recreation Therapy Notes  Date: 03/02/2021  Time: 10:10am   Location: Craft room   Behavioral response: Appropriate   Intervention Topic: Self-care   Discussion/Intervention:  Group content today was focused on Self-Care. The group defined self-care and some positive ways they care for themselves. Individuals expressed ways and reasons why they neglected any self-care in the past. Patients described ways to improve self-care in the future. The group explained what could happen if they did not do any self-care activities at all. The group participated in the intervention self-care assessment where they had a chance to discover some of their weaknesses and strengths in self- care. Patient came up with a self-care plan to improve themselves in the future.   Clinical Observations/Feedback: Patient came to group and was focused on what peers and staff had to say about self-care.  She defined self-care as physically and mentally taking care of yourself. Participant expressed that she  participates in self-care by having respect for others, being understanding and showering. Individual was social with peers and staff while participating in the intervention.  Zan Orlick LRT/CTRS         Hovanes Hymas 03/02/2021 12:20 PM

## 2021-03-02 NOTE — Progress Notes (Signed)
°  [facility] Case Management Discharge Plan :  Will you be returning to the same living situation after discharge:  Yes,  pt reports that she is returning home.  At discharge, do you have transportation home?: Yes,  CSW will assist with transportation needs. Do you have the ability to pay for your medications: Yes,  BCBS  Release of information consent forms completed and in the chart;  Patient's signature needed at discharge.  Patient to Follow up at:  Follow-up Information     [facility]. Call.   Why: A referral has been made on your behalf, a representative from the clinic will reach you shortly after discharge for scheduling. If you do not recieve a call in 24 hours, please call the number provided. Contact information: [address] [place] [address]        [facility] Follow up.   Specialty: Professional Counselor Why: Walk in hours are from 8-12PM and 1PM to 2:30PM. Contact information: [facility] [place] [address] [place] [address] [phone]                 Next level of care provider has access to [facility] Link:no  Safety Planning and Suicide Prevention discussed: No.     Has patient been referred to the Quitline?: Patient refused referral  Patient has been referred for addiction treatment: Pt. refused referral  Harden Mo, LCSW 03/02/2021, 11:51 AM

## 2021-03-02 NOTE — BHH Suicide Risk Assessment (Signed)
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:   Narcolepsy   Total Time spent with patient: 30 minutes  Musculoskeletal: Strength & Muscle Tone: within normal limits Gait & Station: normal Patient leans: N/A  Psychiatric Specialty Exam  Presentation  General Appearance: Appropriate for Environment; Casual  Eye Contact:Good  Speech:Clear and Coherent; Normal Rate  Speech Volume:Normal  Handedness:Right   Mood and Affect  Mood:Depressed; Hopeless  Duration of Depression Symptoms: Greater than two weeks  Affect:Congruent; Depressed; Tearful   Thought Process  Thought Processes:Coherent; Goal Directed; Linear  Descriptions of Associations:Intact  Orientation:Full (Time, Place and Person)  Thought Content:Logical; WDL  History of Schizophrenia/Schizoaffective disorder:No data recorded Duration of Psychotic Symptoms:No data recorded Hallucinations:No data recorded Ideas of Reference:None  Suicidal Thoughts:No data recorded Homicidal Thoughts:No data recorded  Sensorium  Memory:Immediate Good; Recent Good; Remote Fair  Judgment:Fair  Insight:Present   Executive Functions  Concentration:Good  Attention Span:Good  Recall:Good  Fund of Knowledge:Good  Language:Good   Psychomotor Activity  Psychomotor Activity:No data recorded  Assets  Assets:Communication Skills; Desire for Improvement; Resilience; Physical Health   Sleep  Sleep:No data recorded  Physical Exam: Physical Exam Vitals and nursing note reviewed.  Constitutional:      Appearance: Normal appearance.  HENT:     Head: Normocephalic and atraumatic.     Mouth/Throat:     Pharynx: Oropharynx is clear.  Eyes:     Pupils: Pupils are equal, round, and reactive to light.   Cardiovascular:     Rate and Rhythm: Normal rate and regular rhythm.  Pulmonary:     Effort: Pulmonary effort is normal.     Breath sounds: Normal breath sounds.  Abdominal:     General: Abdomen is flat.     Palpations: Abdomen is soft.  Musculoskeletal:        General: Normal range of motion.  Skin:    General: Skin is warm and dry.  Neurological:     General: No focal deficit present.     Mental Status: She is alert. Mental status is at baseline.  Psychiatric:        Mood and Affect: Mood normal.        Thought Content: Thought content normal.   Review of Systems  Constitutional: Negative.   HENT: Negative.    Eyes: Negative.   Respiratory: Negative.    Cardiovascular: Negative.   Gastrointestinal: Negative.   Musculoskeletal: Negative.   Skin: Negative.   Neurological: Negative.   Psychiatric/Behavioral: Negative.    Blood pressure 112/71, pulse 78, temperature 98.2 F (36.8 C), temperature source Oral, resp. rate 17, height 5\' 5"  (1.651 m), weight 102.5 kg, last menstrual period 09/23/2020, SpO2 98 %, unknown if currently breastfeeding. Body mass index is 37.61 kg/m.  Mental Status Per Nursing Assessment::   On Admission:  NA  Demographic Factors:  Divorced or widowed and Caucasian  Loss Factors: Loss of significant relationship  Historical Factors: NA  Risk Reduction Factors:   Sense of responsibility to family, Religious beliefs about death, Employed, Living with another person, especially a relative, and Positive social support  Continued Clinical Symptoms:  Depression:   Anhedonia  Cognitive Features That Contribute To Risk:  None    Suicide Risk:  Minimal: No identifiable suicidal ideation.  Patients presenting with no risk factors but with morbid ruminations; may be classified as minimal risk  based on the severity of the depressive symptoms    Plan Of Care/Follow-up recommendations:  Patient will follow up with outpatient mental health  agencies.  She has [employer] and will be given recommendations to follow up finding a therapist and mental health provider.  Medications provided at discharge  Mordecai Rasmussen, MD 03/02/2021, 10:41 AM

## 2021-03-02 NOTE — Progress Notes (Signed)
D: Pt alert and oriented. Pt denies experiencing any pain, SI/HI, or AVH at this time. Pt reports she will be able to keep herself safe when she returns home.   A: Pt received discharge and medication education/information. Pt belongings were returned and signed for at this time to include printed prescriptions.   R: Pt verbalized understanding of discharge and medication education/information.  Pt escorted by staff to the medical mall front lobby where pt was picked up by [facility] and transported home.  MD also sent patient a work excuse note via e-mail.
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
## 2021-03-07 ENCOUNTER — Other Ambulatory Visit: Payer: Self-pay

## 2021-03-07 ENCOUNTER — Encounter (HOSPITAL_COMMUNITY): Payer: Self-pay

## 2021-03-07 ENCOUNTER — Emergency Department (HOSPITAL_COMMUNITY)
Admission: EM | Admit: 2021-03-07 | Discharge: 2021-03-08 | Disposition: A | Discharge status: Home / Self Care | Payer: BC Managed Care – PPO | Attending: Emergency Medicine | Admitting: Emergency Medicine

## 2021-03-07 DIAGNOSIS — R251 Tremor, unspecified: Secondary | ICD-10-CM | POA: Insufficient documentation

## 2021-03-07 NOTE — ED Provider Triage Note (Signed)
Emergency Medicine Provider Triage Evaluation Note  Barbara [name] , a 26 y.o. female  was evaluated in triage.  Pt complains of possible seizure.  States she was started on wellbutrin last Thursday 03/01/21 at mental health clinic.  States tonight was laying in bed with son when she felt like she was having a seizure.  States she was shaking and could not talk.  She was able to get up and ambulate afterwards to tell her grandmother she needed to come to the hospital.  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  Review of Systems  Positive: shaking Negative: fever  Physical Exam  BP (!) 141/97 (BP Location: Right Arm)    Pulse 95    Temp 98.7 F (37.1 C) (Oral)    Resp (!) 22    LMP 09/23/2020 (Approximate)    SpO2 100%  Gen:   Awake, no distress   Resp:  Normal effort  MSK:   Moves extremities without difficulty  Other:    Medical Decision Making  Medically screening exam initiated at 11:36 PM.  Appropriate orders placed.  Barbara [name] was informed that the remainder of the evaluation will be completed by another provider, this initial triage assessment does not replace that evaluation, and the importance of remaining in the ED until their evaluation is complete.  Questionable seizure-- sounds like she was aware of this without post-ictal period or incontinence.  No seizure activity observed in triage.  AAOx3 currently.  Will check EKG, labs.   Garlon Hatchet, PA-C 03/08/21 [phone]

## 2021-03-07 NOTE — ED Triage Notes (Signed)
Pt states that she woke up in the middle of the night and thinks that she may have had a seizure, recently started a new meds, not postictal

## 2021-03-08 DIAGNOSIS — R251 Tremor, unspecified: Secondary | ICD-10-CM | POA: Diagnosis not present

## 2021-03-08 LAB — RAPID URINE DRUG SCREEN, HOSP PERFORMED
Amphetamines: NOT DETECTED
Barbiturates: NOT DETECTED
Benzodiazepines: NOT DETECTED
Cocaine: NOT DETECTED
Opiates: NOT DETECTED
Tetrahydrocannabinol: POSITIVE — AB

## 2021-03-08 LAB — CBC WITH DIFFERENTIAL/PLATELET
Abs Immature Granulocytes: 0.02 10*3/uL (ref 0.00–0.07)
Basophils Absolute: 0 10*3/uL (ref 0.0–0.1)
Basophils Relative: 0 %
Eosinophils Absolute: 0.2 10*3/uL (ref 0.0–0.5)
Eosinophils Relative: 2 %
HCT: 37.1 % (ref 36.0–46.0)
Hemoglobin: 12.6 g/dL (ref 12.0–15.0)
Immature Granulocytes: 0 %
Lymphocytes Relative: 19 %
Lymphs Abs: 1.6 10*3/uL (ref 0.7–4.0)
MCH: 27.9 pg (ref 26.0–34.0)
MCHC: 34 g/dL (ref 30.0–36.0)
MCV: 82.3 fL (ref 80.0–100.0)
Monocytes Absolute: 0.6 10*3/uL (ref 0.1–1.0)
Monocytes Relative: 7 %
Neutro Abs: 5.8 10*3/uL (ref 1.7–7.7)
Neutrophils Relative %: 72 %
Platelets: 265 10*3/uL (ref 150–400)
RBC: 4.51 MIL/uL (ref 3.87–5.11)
RDW: 12.7 % (ref 11.5–15.5)
WBC: 8.2 10*3/uL (ref 4.0–10.5)
nRBC: 0 % (ref 0.0–0.2)

## 2021-03-08 LAB — I-STAT BETA HCG BLOOD, ED (MC, WL, AP ONLY): I-stat hCG, quantitative: <5 m[IU]/mL (ref <5)

## 2021-03-08 LAB — BASIC METABOLIC PANEL
Anion gap: 12 (ref 5–15)
BUN: 9 mg/dL (ref 6–20)
CO2: 23 mmol/L (ref 22–32)
Calcium: 9.5 mg/dL (ref 8.9–10.3)
Chloride: 104 mmol/L (ref 98–111)
Creatinine, Ser: 0.92 mg/dL (ref 0.44–1.00)
GFR, Estimated: >60 mL/min (ref >60)
Glucose, Bld: 81 mg/dL (ref 70–99)
Potassium: 3.2 mmol/L — ABNORMAL LOW (ref 3.5–5.1)
Sodium: 139 mmol/L (ref 135–145)

## 2021-03-08 LAB — ETHANOL: Alcohol, Ethyl (B): <10 mg/dL (ref <10)

## 2021-03-08 NOTE — Discharge Instructions (Signed)
It is not clear what caused your shaking episode last night while you are asleep.  Wellbutrin can cause seizures, so if you have a seizure you will have to decrease the dose of Wellbutrin, or stop it completely.  It does seem to be helping your depression so for now without clearly having a seizure it makes sense to continue it.  This type of medication can cause sleep disturbances which may be causing you to be somewhat restless.  This may be complicated by her prior history of narcolepsy and cataplexy.  If you continue to be concerned about having a seizure you will have to follow-up with a neurologist.  Please call your primary care doctor for follow-up appointment as soon as possible to discuss these issues and obtain further care and treatment for depression

## 2021-03-08 NOTE — ED Provider Notes (Signed)
[facility] EMERGENCY DEPARTMENT Provider Note   CSN: [id number] Arrival date & time: 03/07/21  2323     History  Chief Complaint  Patient presents with   Seizures    Barbara [name] is a 26 y.o. female.  HPI Patient was awakened from sleep late last evening, when she noticed that she was shaking.  She states that when she awoke she was having trouble but was able to get some help from her grandmother who brought her here.  She is taking her increased dose of Wellbutrin as prescribed.  She states that while in the waiting room, she had a laughing spell that she could not control.  She overall feels like her depression is improved.  She is not feeling suicidal.  She has not yet followed up with her PCP to obtain further care and treatment.  She was recently discharged from a psychiatric facility after evaluation for suicidal ideation and self injury on 03/02/2021.  Problems addressed included recurrent depression, and narcolepsy.  Bupropion was started, at 150 mg daily, with plans to titrate to 300 mg/day on March 03, 2018.  Outpatient therapist follow-up had been recommended.  It is expected that this will be guided by her PCP.  Home Medications Prior to Admission medications   Medication Sig Start Date End Date Taking? Authorizing Provider  buPROPion (WELLBUTRIN XL) 300 MG 24 hr tablet Take 1 tablet (300 mg total) by mouth daily. 03/03/21   Clapacs, Jackquline Denmark, MD  sertraline (ZOLOFT) 100 MG tablet Take 1 tablet (100 mg total) by mouth at bedtime. 03/02/21   Clapacs, Jackquline Denmark, MD  traZODone (DESYREL) 100 MG tablet Take 1 tablet (100 mg total) by mouth at bedtime as needed for sleep. 03/02/21   Clapacs, Jackquline Denmark, MD      Allergies    Corn-containing products, Percocet [oxycodone-acetaminophen], and Other    Review of Systems   Review of Systems  All other systems reviewed and are negative.  Physical Exam Updated Vital Signs BP 129/85    Pulse 88    Temp 98.4 F (36.9  C) (Oral)    Resp 16    LMP 09/23/2020 (Approximate)    SpO2 99%  Physical Exam Vitals and nursing note reviewed.  Constitutional:      Appearance: She is well-developed. She is not ill-appearing.  HENT:     Head: Normocephalic and atraumatic.     Right Ear: External ear normal.     Left Ear: External ear normal.     Mouth/Throat:     Pharynx: No oropharyngeal exudate or posterior oropharyngeal erythema.  Eyes:     Conjunctiva/sclera: Conjunctivae normal.     Pupils: Pupils are equal, round, and reactive to light.  Neck:     Trachea: Phonation normal.  Cardiovascular:     Rate and Rhythm: Normal rate and regular rhythm.  Pulmonary:     Effort: Pulmonary effort is normal.  Abdominal:     General: There is no distension.  Musculoskeletal:        General: Normal range of motion.     Cervical back: Normal range of motion and neck supple.  Skin:    General: Skin is warm and dry.  Neurological:     Mental Status: She is alert and oriented to person, place, and time.     Cranial Nerves: No cranial nerve deficit.     Sensory: No sensory deficit.     Motor: No abnormal muscle tone.  Coordination: Coordination normal.     Comments: No dysarthria or aphasia.  No ataxia  Psychiatric:        Mood and Affect: Mood normal.        Behavior: Behavior normal.        Thought Content: Thought content normal.        Judgment: Judgment normal.    ED Results / Procedures / Treatments   Labs (all labs ordered are listed, but only abnormal results are displayed) Labs Reviewed  BASIC METABOLIC PANEL - Abnormal; Notable for the following components:      Result Value   Potassium 3.2 (*)    All other components within normal limits  RAPID URINE DRUG SCREEN, HOSP PERFORMED - Abnormal; Notable for the following components:   Tetrahydrocannabinol POSITIVE (*)    All other components within normal limits  CBC WITH DIFFERENTIAL/PLATELET  ETHANOL  I-STAT BETA HCG BLOOD, ED ([facility], [facility], [facility] ONLY)     EKG EKG Interpretation  Date/Time:  Wednesday March 07 2021 23:47:16 EST Ventricular Rate:  91 PR Interval:  194 QRS Duration: 82 QT Interval:  352 QTC Calculation: 432 R Axis:   89 Text Interpretation: Normal sinus rhythm Low voltage QRS Nonspecific ST abnormality Abnormal ECG No previous ECGs available Confirmed by Dione Booze ([id number]) on 03/08/2021 12:29:35 AM  Radiology No results found.  Procedures Procedures    Medications Ordered in ED Medications - No data to display  ED Course/ Medical Decision Making/ A&P                           Medical Decision Making Patient presenting to be evaluated for shaking episode that occurred since recently started on an increased dose of Wellbutrin for depression.  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  Problems Addressed: Episode of shaking: acute illness or injury    Details: Onset during sleep, not witnessed by anyone who can give a history  Amount and/or Complexity of Data Reviewed External Data Reviewed: notes.    Details: Prior hospitalization for depression with SI and discharged on 03/02/2021. Labs: ordered.    Details: Blood count and metabolic panel, alcohol level and drug screen, pregnancy test-findings are normal except THC present in urine ECG/medicine tests: ordered.    Details: EKG with normal sinus rhythm, cardiac monitor with normal sinus rhythm.  Risk Prescription drug management. Decision regarding hospitalization. Risk Details: Unclear cause for shaking episode, most likely not seizure but that cannot be ruled out clinically.  If she has persistent symptoms she may need EEG and/or empiric lowering of bupropion dose.  She has history of narcolepsy which is likely contributing to her sleep disturbance.  She may need close follow-up with neurology, she has persistent symptoms.  No indication for hospitalization at this time.  We will give work release for 2 days.  I do not think that she needs to modify  activities for a suspected seizure.  I have very low suspicion for seizure causing her shaking episode.           Final Clinical Impression(s) / ED Diagnoses Final diagnoses:  Episode of shaking    Rx / DC Orders ED Discharge Orders     None         Mancel Bale, MD 03/08/21 [phone]

## 2021-03-13 ENCOUNTER — Telehealth (HOSPITAL_COMMUNITY): Payer: Self-pay | Admitting: Physician Assistant

## 2021-03-13 NOTE — BH Assessment (Signed)
Care Management - Follow Up [facility] Discharges   Patient has been placed in an inpatient psychiatric hospital [facility]) on 02-28-21.

## 2021-04-26 ENCOUNTER — Encounter (HOSPITAL_COMMUNITY): Payer: Self-pay | Admitting: Physician Assistant

## 2021-04-26 ENCOUNTER — Ambulatory Visit (INDEPENDENT_AMBULATORY_CARE_PROVIDER_SITE_OTHER): Payer: BC Managed Care – PPO | Admitting: Physician Assistant

## 2021-04-26 DIAGNOSIS — F411 Generalized anxiety disorder: Secondary | ICD-10-CM | POA: Diagnosis not present

## 2021-04-26 DIAGNOSIS — F330 Major depressive disorder, recurrent, mild: Secondary | ICD-10-CM

## 2021-04-26 DIAGNOSIS — G479 Sleep disorder, unspecified: Secondary | ICD-10-CM | POA: Insufficient documentation

## 2021-04-26 HISTORY — DX: Sleep disorder, unspecified: G47.9

## 2021-04-26 MED ORDER — BUPROPION HCL ER (XL) 450 MG PO TB24: 450.0000 mg | ORAL_TABLET | Freq: Every day | ORAL | 1 refills | Status: DC
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
## 2021-04-30 ENCOUNTER — Encounter: Payer: Self-pay | Admitting: Obstetrics and Gynecology

## 2021-06-08 ENCOUNTER — Other Ambulatory Visit: Payer: Self-pay

## 2021-06-08 ENCOUNTER — Encounter: Payer: Self-pay | Admitting: Obstetrics and Gynecology

## 2021-06-08 ENCOUNTER — Telehealth (INDEPENDENT_AMBULATORY_CARE_PROVIDER_SITE_OTHER): Payer: BC Managed Care – PPO | Admitting: Physician Assistant

## 2021-06-08 DIAGNOSIS — F411 Generalized anxiety disorder: Secondary | ICD-10-CM

## 2021-06-08 DIAGNOSIS — F330 Major depressive disorder, recurrent, mild: Secondary | ICD-10-CM

## 2021-06-08 DIAGNOSIS — G479 Sleep disorder, unspecified: Secondary | ICD-10-CM | POA: Diagnosis not present

## 2021-06-08 MED ORDER — BUPROPION HCL ER (XL) 450 MG PO TB24
450.0000 mg | ORAL_TABLET | Freq: Every day | ORAL | 1 refills | Status: DC
  Filled 2021-06-08: qty 30, 30d supply, fill #0
  Filled 2021-08-04 – 2021-08-10 (×2): qty 30, 30d supply, fill #1

## 2021-06-08 MED ORDER — BUSPIRONE HCL 7.5 MG PO TABS: 7.5000 mg | ORAL_TABLET | Freq: Two times a day (BID) | ORAL | 1 refills | Status: DC

## 2021-06-08 MED ORDER — TRAZODONE HCL 100 MG PO TABS: 100.0000 mg | ORAL_TABLET | Freq: Every evening | ORAL | 1 refills | Status: DC | PRN

## 2021-06-08 NOTE — Progress Notes (Addendum)
BH MD/PA/NP OP Progress Note  Virtual Visit via Telephone Note  I connected with Barbara [name] on 06/08/21 at 11:30 AM EDT by telephone and verified that I am speaking with the correct person using two identifiers.  Location: Patient: Home Provider: Clinic   I discussed the limitations, risks, security and privacy concerns of performing an evaluation and management service by telephone and the availability of in person appointments. I also discussed with the patient that there may be a patient responsible charge related to this service. The patient expressed understanding and agreed to proceed.  Follow Up Instructions:  I discussed the assessment and treatment plan with the patient. The patient was provided an opportunity to ask questions and all were answered. The patient agreed with the plan and demonstrated an understanding of the instructions.   The patient was advised to call back or seek an in-person evaluation if the symptoms worsen or if the condition fails to improve as anticipated.  I provided 20 minutes of non-face-to-face time during this encounter.  Meta Hatchet, PA    06/08/2021 11:43 AM TEDRA COPPERNOLL  MRN:  [id number]  Chief Complaint:  Chief Complaint  Patient presents with   Follow-up   Medication Management   HPI:   Barbara [name] is a 26 year old female with a past psychiatric history significant for sleep disturbances, major depressive disorder, and anxiety who presents to [facility] via virtual telephone visit for follow-up and medication management.  Patient is currently being managed on the following medications:  Bupropion 450 mg TB24 daily Trazodone 100 mg at bedtime  Patient reports that she was unable to pick up her latest prescription of Wellbutrin due to her insurance not covering the dosage.  Patient reports that she had to resort to using Wellbutrin 300 mg daily.  Since taking Wellbutrin 300 mg  daily, patient reports that her mood has been good and denies any depressive episodes.  While performing a PHQ-9 screen, it appears patient is plagued with negative thoughts, difficulty concentrating, trouble falling asleep, and lack of interest in doing things.  Patient reports that her anxiety has picked up a little.  Patient rates her anxiety an 8 out of 10 and states that she has been panicking more lately.  Patient endorses the following stressors: financial instability and trying to find housing.  Patient and her children are currently living with her grandmother at this time.  A PHQ-9 screen was performed with the patient going at 21.  A GAD-7 screen was also performed with the patient scoring a 14.  Visit Diagnosis:    ICD-10-CM   1. Sleep disturbances  G47.9 traZODone (DESYREL) 100 MG tablet    DISCONTINUED: traZODone (DESYREL) 100 MG tablet    2. Mild episode of recurrent major depressive disorder (HCC)  F33.0 buPROPion HCl ER, XL, 450 MG TB24    traZODone (DESYREL) 100 MG tablet    DISCONTINUED: traZODone (DESYREL) 100 MG tablet    3. 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  Great uncle - Schizophrenia  Family History:  Family History  Problem Relation Age of Onset    Depression Mother    Healthy Brother    Healthy Sister    Hypertension Maternal Grandmother    Depression Maternal Grandmother    Asthma Maternal Grandmother    Diabetes Maternal Grandmother    Heart disease Maternal Grandmother    Hearing loss Maternal Grandfather    COPD Maternal Grandfather     Social History:  Social History   Socioeconomic History   Marital status: Legally Separated    Spouse name: Not on file   Number of children: 1   Years of education: 11   Highest education level: Not on file  Occupational History   Occupation: unemployed  Tobacco Use   Smoking status: Never   Smokeless tobacco: Never  Vaping Use   Vaping Use: Never used  Substance and Sexual Activity   Alcohol use: No    Alcohol/week: 0.0 standard drinks   Drug use: No   Sexual activity: Not Currently    Partners: Male  Other Topics Concern   Not on file  Social History Narrative   Patient does not drink caffeine.   Patient is right handed.   Social Determinants of Health   Financial Resource Strain: Not on file  Food Insecurity: Not on file  Transportation Needs: Not on file  Physical Activity: Not on file  Stress: Not on file  Social Connections: Not on file    Allergies:  Allergies  Allergen Reactions   Corn-Containing Products Anaphylaxis and Swelling    Pt states that it is the silk that she is allergic to.   Percocet [Oxycodone-Acetaminophen] Anaphylaxis and Other (See Comments)    States feels like chest is tightening up.   Other Itching and Nausea And Vomiting    Sherbert    Metabolic Disorder Labs: Lab Results  Component Value Date   HGBA1C 5.0 02/28/2021   MPG 96.8 02/28/2021   No results found for: PROLACTIN Lab Results  Component Value Date   CHOL 229 (H) 02/28/2021   TRIG 214 (H) 02/28/2021   HDL 45 02/28/2021   CHOLHDL 5.1 02/28/2021   VLDL 43 (H) 02/28/2021   LDLCALC 141 (H) 02/28/2021   Lab Results  Component Value Date   TSH 2.581 02/28/2021     Therapeutic Level Labs: No results found for: LITHIUM No results found for: VALPROATE No components found for:  CBMZ  Current Medications: Current Outpatient Medications  Medication Sig Dispense Refill   busPIRone (BUSPAR) 7.5 MG tablet Take 1 tablet (7.5 mg total) by mouth 2 (two) times daily. 60 tablet 1   buPROPion HCl ER, XL, 450 MG TB24 Take 1 tablet by mouth daily. 30 tablet 1   traZODone (DESYREL) 100 MG tablet Take 1 tablet (100 mg total) by mouth at bedtime as needed for sleep. 30 tablet 1   No current facility-administered medications for this visit.     Musculoskeletal: Strength & Muscle Tone: Unable to assess due to telemedicine visit Gait & Station: Unable to assess due to telemedicine visit Patient leans: Unable to assess due to telemedicine visit  Psychiatric  Specialty Exam: Review of Systems  Psychiatric/Behavioral:  Positive for sleep disturbance. Negative for decreased concentration, dysphoric mood, hallucinations, self-injury and suicidal ideas. The patient is nervous/anxious. The patient is not hyperactive.    Last menstrual period 09/23/2020, unknown if currently breastfeeding.There is no height or weight on file to calculate BMI.  General Appearance: Unable to assess due to telemedicine visit  Eye Contact:  Unable to assess due to telemedicine visit  Speech:  Clear and Coherent and Normal Rate  Volume:  Normal  Mood:  Anxious and Depressed  Affect:  Congruent  Thought Process:  Coherent, Goal Directed, and Descriptions of Associations: Intact  Orientation:  Full (Time, Place, and Person)  Thought Content: WDL   Suicidal Thoughts:  No  Homicidal Thoughts:  No  Memory:  Immediate;   Good Recent;   Good Remote;   Fair  Judgement:  Good  Insight:  Good  Psychomotor Activity:  Normal  Concentration:  Concentration: Good and Attention Span: Good  Recall:  Fair  Fund of Knowledge: Good  Language: Good  Akathisia:  No  Handed:  Right  AIMS (if  indicated): not done  Assets:  Communication Skills Desire for Improvement Housing Social Support Vocational/Educational  ADL's:  Intact  Cognition: WNL  Sleep:  Fair   Screenings: AUDIT    Flowsheet Row Admission (Discharged) [facility]  Alcohol Use Disorder Identification Test Final Score (AUDIT) 2      GAD-7    Flowsheet Row [facility]  Total GAD-7 Score 14 0      PHQ2-9    Flowsheet Row [facility]  PHQ-2 Total Score 4 0  PHQ-9 Total Score 21 --      Flowsheet Row [facility]  C-SSRS RISK CATEGORY No Risk No Risk No Risk        Assessment and Plan:   Barbara [name] is a 26 year old female with a past psychiatric history significant for sleep disturbances, major depressive disorder, and anxiety who [facility] via virtual telephone visit for follow-up and medication management.  Patient continues to endorse worsening depression and anxiety related to stressors in her life.  Patient reports that her insurance would not cover her recent dosage adjustment of Wellbutrin and has resorted to using Wellbutrin 300 mg daily for the management of her depressive symptoms.  Provider informed patient that her Wellbutrin 450 mg daily would be [facility] due to the medications being more affordable at the pharmacy.  Patient was advised to contact facility if she  ran into any issues retrieving her medications.  Patient was recommended buspirone 7.5 mg 2 times daily for the management of her anxiety.  Patient was agreeable to recommendation.  Patient's medications to be e-prescribed to pharmacy of choice.  Collaboration of Care: Collaboration of Care: Medication Management AEB provider managing patient's psychiatric medications, Primary Care Provider AEB patient being seen by a primary care provider, Psychiatrist AEB patient being followed by mental health provider, and Other provider involved in patient's care AEB patient being  seen by OBGYN  Patient/Guardian was advised Release of Information must be obtained prior to any record release in order to collaborate their care with an outside provider. Patient/Guardian was advised if they have not already done so to contact the registration department to sign all necessary forms in order for [place] to release information regarding their care.   Consent: Patient/Guardian gives verbal consent for treatment and assignment of benefits for services provided during this visit. Patient/Guardian expressed understanding and agreed to proceed.   1. Mild episode of recurrent major depressive disorder (HCC)  - buPROPion HCl ER, XL, 450 MG TB24; Take 1 tablet by mouth daily.  Dispense: 30 tablet; Refill: 1 - traZODone (DESYREL) 100 MG tablet; Take 1 tablet (100 mg total) by mouth at bedtime as needed for sleep.  Dispense: 30 tablet; Refill: 1  2. Sleep disturbances  - traZODone (DESYREL) 100 MG tablet; Take 1 tablet (100 mg total) by mouth at bedtime as needed for sleep.  Dispense: 30 tablet; Refill: 1  3. Anxiety state  - busPIRone (BUSPAR) 7.5 MG tablet; Take 1 tablet (7.5 mg total) by mouth 2 (two) times daily.  Dispense: 60 tablet; Refill: 1 - traZODone (DESYREL) 100 MG tablet; Take 1 tablet (100 mg total) by mouth at bedtime as needed for sleep.  Dispense: 30 tablet; Refill: 1  Patient to follow up in 2 month Provider  spent a total of 20 minutes with the patient reviewing patient's chart  Meta Hatchet, PA 06/08/2021, 11:43 AM

## 2021-06-11 ENCOUNTER — Encounter (HOSPITAL_COMMUNITY): Payer: Self-pay | Admitting: Physician Assistant

## 2021-06-30 ENCOUNTER — Other Ambulatory Visit (HOSPITAL_COMMUNITY): Payer: Self-pay | Admitting: Physician Assistant

## 2021-06-30 ENCOUNTER — Inpatient Hospital Stay (HOSPITAL_COMMUNITY): Admit: 2021-06-30 | Payer: Self-pay

## 2021-06-30 DIAGNOSIS — F411 Generalized anxiety disorder: Secondary | ICD-10-CM

## 2021-08-06 ENCOUNTER — Other Ambulatory Visit (HOSPITAL_COMMUNITY): Payer: Self-pay

## 2021-08-10 ENCOUNTER — Other Ambulatory Visit: Payer: Self-pay

## 2021-08-10 ENCOUNTER — Telehealth (HOSPITAL_COMMUNITY): Payer: BC Managed Care – PPO | Admitting: Physician Assistant

## 2021-08-10 ENCOUNTER — Other Ambulatory Visit: Payer: Self-pay | Admitting: Family

## 2021-08-10 DIAGNOSIS — F330 Major depressive disorder, recurrent, mild: Secondary | ICD-10-CM

## 2021-08-10 DIAGNOSIS — G479 Sleep disorder, unspecified: Secondary | ICD-10-CM

## 2021-08-10 DIAGNOSIS — F411 Generalized anxiety disorder: Secondary | ICD-10-CM

## 2021-08-10 MED ORDER — BUSPIRONE HCL 7.5 MG PO TABS: 7.5000 mg | ORAL_TABLET | Freq: Two times a day (BID) | ORAL | 1 refills | Status: DC

## 2021-08-10 MED ORDER — BUPROPION HCL ER (XL) 450 MG PO TB24: 450.0000 mg | ORAL_TABLET | Freq: Every day | ORAL | 1 refills | Status: DC

## 2021-08-10 NOTE — Progress Notes (Signed)
Wellbutrin 450 mg and BuSpar 7.5 mg p.o. twice daily was refilled x 1 refill.  E prescribed to [facility]

## 2021-08-29 ENCOUNTER — Other Ambulatory Visit: Payer: Self-pay

## 2023-02-12 NOTE — L&D Delivery Note (Signed)
 OB/GYN Faculty Practice Delivery Note  Barbara [name] is a 28 y.o. [id number] s/p SVD at [redacted]w[redacted]d. She was admitted for IOL for gHTN, GDMA1, and polyhydramnios.   ROM: 4h 41m with clear fluid GBS Status: positive, received adequate ppx Maximum Maternal Temperature: 98.59F  Labor Progress: Initial SVE FT/L/H from 10/8, progressed with dual cytotec, foley, pitocin  and SROM to fully dilated/+2  Delivery Date/Time: 11/25/2023 1135 Delivery: Called to room and patient was complete and pushing. Head delivered ROA. No nuchal cord present. Shoulder and body delivered in usual fashion. Infant with spontaneous cry, placed on mother's abdomen, dried and stimulated. Cord clamped x 2 after 1-minute delay, and cut by father of baby. Cord blood drawn. Placenta delivered spontaneously, intact, with 3-vessel cord. Fundus firm with massage and Pitocin . Labia, perineum, vagina, and cervix inspected, no lacerations found.   Placenta: Intact, delivered spontaneously Complications: None Lacerations: None EBL: Analgesia: Epidural  Infant: Viable female  APGARs 7,9  3000g  [name], MD 11/25/2023, 11:47 AM

## 2023-02-19 ENCOUNTER — Emergency Department (HOSPITAL_COMMUNITY)
Admission: EM | Admit: 2023-02-19 | Discharge: 2023-02-20 | Disposition: A | Discharge status: Home / Self Care | Payer: 59 | Attending: Emergency Medicine | Admitting: Emergency Medicine

## 2023-02-19 ENCOUNTER — Encounter (HOSPITAL_COMMUNITY): Payer: Self-pay

## 2023-02-19 ENCOUNTER — Other Ambulatory Visit: Payer: Self-pay

## 2023-02-19 DIAGNOSIS — Z202 Contact with and (suspected) exposure to infections with a predominantly sexual mode of transmission: Secondary | ICD-10-CM | POA: Diagnosis not present

## 2023-02-19 DIAGNOSIS — B9689 Other specified bacterial agents as the cause of diseases classified elsewhere: Secondary | ICD-10-CM | POA: Insufficient documentation

## 2023-02-19 DIAGNOSIS — N390 Urinary tract infection, site not specified: Secondary | ICD-10-CM | POA: Insufficient documentation

## 2023-02-19 DIAGNOSIS — N760 Acute vaginitis: Secondary | ICD-10-CM | POA: Insufficient documentation

## 2023-02-19 DIAGNOSIS — D72829 Elevated white blood cell count, unspecified: Secondary | ICD-10-CM | POA: Insufficient documentation

## 2023-02-19 DIAGNOSIS — R1030 Lower abdominal pain, unspecified: Secondary | ICD-10-CM

## 2023-02-19 LAB — CBC
HCT: 41.6 % (ref 36.0–46.0)
Hemoglobin: 14.1 g/dL (ref 12.0–15.0)
MCH: 28.2 pg (ref 26.0–34.0)
MCHC: 33.9 g/dL (ref 30.0–36.0)
MCV: 83.2 fL (ref 80.0–100.0)
Platelets: 323 10*3/uL (ref 150–400)
RBC: 5 MIL/uL (ref 3.87–5.11)
RDW: 12.6 % (ref 11.5–15.5)
WBC: 14.7 10*3/uL — ABNORMAL HIGH (ref 4.0–10.5)
nRBC: 0 % (ref 0.0–0.2)

## 2023-02-19 LAB — COMPREHENSIVE METABOLIC PANEL
ALT: 51 U/L — ABNORMAL HIGH (ref 0–44)
AST: 22 U/L (ref 15–41)
Albumin: 4 g/dL (ref 3.5–5.0)
Alkaline Phosphatase: 54 U/L (ref 38–126)
Anion gap: 8 (ref 5–15)
BUN: 15 mg/dL (ref 6–20)
CO2: 25 mmol/L (ref 22–32)
Calcium: 9.5 mg/dL (ref 8.9–10.3)
Chloride: 103 mmol/L (ref 98–111)
Creatinine, Ser: 0.69 mg/dL (ref 0.44–1.00)
GFR, Estimated: >60 mL/min (ref >60)
Glucose, Bld: 95 mg/dL (ref 70–99)
Potassium: 3.9 mmol/L (ref 3.5–5.1)
Sodium: 136 mmol/L (ref 135–145)
Total Bilirubin: 0.5 mg/dL (ref 0.0–1.2)
Total Protein: 8.2 g/dL — ABNORMAL HIGH (ref 6.5–8.1)

## 2023-02-19 LAB — LIPASE, BLOOD: Lipase: 35 U/L (ref 11–51)

## 2023-02-19 LAB — URINALYSIS, ROUTINE W REFLEX MICROSCOPIC
Bilirubin Urine: NEGATIVE
Glucose, UA: NEGATIVE mg/dL
Hgb urine dipstick: NEGATIVE
Ketones, ur: NEGATIVE mg/dL
Nitrite: NEGATIVE
Protein, ur: 30 mg/dL — AB
Specific Gravity, Urine: 1.026 (ref 1.005–1.030)
Squamous Epithelial / HPF: >50 /[HPF] (ref 0–5)
WBC, UA: >50 WBC/hpf (ref 0–5)
pH: 5 (ref 5.0–8.0)

## 2023-02-19 LAB — HCG, SERUM, QUALITATIVE: Preg, Serum: NEGATIVE

## 2023-02-19 NOTE — ED Provider Triage Note (Signed)
 Emergency Medicine Provider Triage Evaluation Note  Barbara [name] , a 28 y.o. female  Barbara [name] was evaluated in triage.  Pt complains of lower abd pain, bloating, intermittent nausea that started at 1600 today. However has had abdominal cramping since Nov. Last BM was last night and normal. Acetone vaginal odor. No vaginal discharge. No fevers  Plan b at end of September and has not had full menses since. One episode of spotting two weeks ago.  Interested in STD testing but no known xposure  Review of Systems  Positive: abd pain, bloating, intermittent nausea Negative: fevers  Physical Exam  BP (!) 152/105 (BP Location: Left Arm)   Pulse (!) 107   Temp 97.9 F (36.6 C) (Oral)   Resp 18   Ht 5' 5 (1.651 m)   Wt 102.5 kg   SpO2 94%   BMI 37.60 kg/m  Gen:   Awake, no distress   Resp:  Normal effort  MSK:   Moves extremities without difficulty  Other:  Suprapubic TTP  Medical Decision Making  Medically screening exam initiated at 7:07 PM.  Appropriate orders placed.  Barbara [name] was informed that the remainder of the evaluation will be completed by another provider, this initial triage assessment does not replace that evaluation, and the importance of remaining in the ED until their evaluation is complete.  Labs ordered   Barbara [name], Barbara [name] 02/19/23 1914

## 2023-02-19 NOTE — ED Triage Notes (Signed)
 Pt reports lower abdominal cramping and pelvic pain. Pt reports her last period was in September and she could be pregnant. C.o nausea.

## 2023-02-20 DIAGNOSIS — N390 Urinary tract infection, site not specified: Secondary | ICD-10-CM | POA: Diagnosis not present

## 2023-02-20 LAB — WET PREP, GENITAL
Sperm: NONE SEEN
Trich, Wet Prep: NONE SEEN
WBC, Wet Prep HPF POC: >=10 — AB (ref <10)
Yeast Wet Prep HPF POC: NONE SEEN

## 2023-02-20 MED ORDER — ONDANSETRON HCL 4 MG PO TABS: 4.0000 mg | ORAL_TABLET | Freq: Three times a day (TID) | ORAL | 0 refills | Status: DC | PRN

## 2023-02-20 MED ORDER — DOXYCYCLINE HYCLATE 100 MG PO TABS: 100.0000 mg | ORAL_TABLET | Freq: Two times a day (BID) | ORAL | 0 refills | Status: DC

## 2023-02-20 MED ORDER — METRONIDAZOLE 500 MG PO TABS: 500.0000 mg | ORAL_TABLET | Freq: Two times a day (BID) | ORAL | 0 refills | Status: DC

## 2023-02-20 MED ORDER — LIDOCAINE HCL (PF) 1 % IJ SOLN
1.0000 mL | Freq: Once | INTRAMUSCULAR | Status: AC
  Administered 2023-02-20: 2.1 mL
  Filled 2023-02-20: qty 30

## 2023-02-20 MED ORDER — CEFTRIAXONE SODIUM 1 G IJ SOLR
500.0000 mg | Freq: Once | INTRAMUSCULAR | Status: AC
  Administered 2023-02-20: 500 mg via INTRAMUSCULAR
  Filled 2023-02-20: qty 10

## 2023-02-20 MED ORDER — CEPHALEXIN 500 MG PO CAPS
500.0000 mg | ORAL_CAPSULE | Freq: Once | ORAL | Status: AC
  Administered 2023-02-20: 500 mg via ORAL
  Filled 2023-02-20: qty 1

## 2023-02-20 MED ORDER — CEPHALEXIN 500 MG PO CAPS: 500.0000 mg | ORAL_CAPSULE | Freq: Four times a day (QID) | ORAL | 0 refills | Status: DC

## 2023-02-20 NOTE — Discharge Instructions (Signed)
 You have been sent multiple antibiotics today.  You have a urinary tract infection and prescribed Keflex .  Please take until complete.  Zofran  is for nausea as needed.  The metronidazole  for bacterial vaginosis.  Please take until complete.  The doxycycline  is for possible gonorrhea/chlamydia infection.  If those test are negative on MyChart you may discontinue this medication.  If they are positive you should refrain from sex until the antibiotics are completed and your partner should also be treated and refrain from sex until antibiotics are completed.  If you develop any life-threatening symptoms please return to the emergency department.

## 2023-02-20 NOTE — ED Provider Notes (Signed)
 [place] EMERGENCY DEPARTMENT AT [facility] Provider Note   CSN: [id number] Arrival date & time: 02/19/23  1742     History  Chief Complaint  Patient presents with   Abdominal Pain    Barbara [name] is a 28 y.o. female.  Patient presents to the emergency department complaining of lower abdominal pain with some abdominal cramping.  She reports irregular periods with her last period being in September.  She is concerned she may be pregnant.  She does endorse mild nausea with no emesis.  Patient states she is also concerned that her sexual partner may have exposed her to a potential STI.  She denies vaginal discharge or pain.  Past medical history significant for major depression, back pain, narcolepsy, anxiety, intermittent hemiparesis   Abdominal Pain      Home Medications Prior to Admission medications   Medication Sig Start Date End Date Taking? Authorizing Provider  cephALEXin  (KEFLEX ) 500 MG capsule Take 1 capsule (500 mg total) by mouth 4 (four) times daily. 02/20/23  Yes Logan Ubaldo NOVAK, PA-C  doxycycline  (VIBRA -TABS) 100 MG tablet Take 1 tablet (100 mg total) by mouth 2 (two) times daily. 02/20/23  Yes Logan Ubaldo NOVAK, PA-C  metroNIDAZOLE  (FLAGYL ) 500 MG tablet Take 1 tablet (500 mg total) by mouth 2 (two) times daily. 02/20/23  Yes Logan Ubaldo NOVAK, PA-C  ondansetron  (ZOFRAN ) 4 MG tablet Take 1 tablet (4 mg total) by mouth every 8 (eight) hours as needed for nausea or vomiting. 02/20/23  Yes Logan Ubaldo NOVAK, PA-C  buPROPion  HCl ER, XL, 450 MG TB24 Take 1 tablet by mouth daily. 08/10/21   Ezzard Staci SAILOR, NP  busPIRone  (BUSPAR ) 7.5 MG tablet Take 1 tablet (7.5 mg total) by mouth 2 (two) times daily. 08/10/21   Ezzard Staci SAILOR, NP  traZODone  (DESYREL ) 100 MG tablet Take 1 tablet (100 mg total) by mouth at bedtime as needed for sleep. 06/08/21   Nwoko, Uchenna E, PA      Allergies    Corn-containing products, Percocet [oxycodone -acetaminophen ], and Other    Review  of Systems   Review of Systems  Gastrointestinal:  Positive for abdominal pain.    Physical Exam Updated Vital Signs BP (!) 144/90 (BP Location: Right Arm)   Pulse 79   Temp 98.2 F (36.8 C) (Oral)   Resp 15   Ht 5' 5 (1.651 m)   Wt 102.5 kg   SpO2 95%   BMI 37.60 kg/m  Physical Exam Vitals and nursing note reviewed.  Constitutional:      General: She is not in acute distress.    Appearance: She is well-developed. She is obese.  HENT:     Head: Normocephalic and atraumatic.  Eyes:     Conjunctiva/sclera: Conjunctivae normal.  Cardiovascular:     Rate and Rhythm: Normal rate and regular rhythm.     Heart sounds: No murmur heard. Pulmonary:     Effort: Pulmonary effort is normal. No respiratory distress.     Breath sounds: Normal breath sounds.  Abdominal:     Palpations: Abdomen is soft.     Tenderness: There is no abdominal tenderness.  Genitourinary:    Comments: Deferred Musculoskeletal:        General: No swelling.     Cervical back: Neck supple.  Skin:    General: Skin is warm and dry.     Capillary Refill: Capillary refill takes less than 2 seconds.  Neurological:     Mental Status: She is  alert.  Psychiatric:        Mood and Affect: Mood normal.     ED Results / Procedures / Treatments   Labs (all labs ordered are listed, but only abnormal results are displayed) Labs Reviewed  WET PREP, GENITAL - Abnormal; Notable for the following components:      Result Value   Clue Cells Wet Prep HPF POC PRESENT (*)    WBC, Wet Prep HPF POC >=10 (*)    All other components within normal limits  COMPREHENSIVE METABOLIC PANEL - Abnormal; Notable for the following components:   Total Protein 8.2 (*)    ALT 51 (*)    All other components within normal limits  CBC - Abnormal; Notable for the following components:   WBC 14.7 (*)    All other components within normal limits  URINALYSIS, ROUTINE W REFLEX MICROSCOPIC - Abnormal; Notable for the following components:    APPearance CLOUDY (*)    Protein, ur 30 (*)    Leukocytes,Ua LARGE (*)    Bacteria, UA FEW (*)    Non Squamous Epithelial 0-5 (*)    All other components within normal limits  LIPASE, BLOOD  HCG, SERUM, QUALITATIVE  GC/CHLAMYDIA PROBE AMP ([place]) NOT AT [facility]    EKG None  Radiology No results found.  Procedures Procedures    Medications Ordered in ED Medications  cephALEXin  (KEFLEX ) capsule 500 mg (500 mg Oral Given 02/20/23 0158)  cefTRIAXone  (ROCEPHIN ) injection 500 mg (500 mg Intramuscular Given 02/20/23 0227)  lidocaine  (PF) (XYLOCAINE ) 1 % injection 1-2.1 mL (2.1 mLs Other Given 02/20/23 0228)    ED Course/ Medical Decision Making/ A&P                                 Medical Decision Making Amount and/or Complexity of Data Reviewed Labs: ordered.  Risk Prescription drug management.   This patient presents to the ED for concern of abdominal pain, this involves an extensive number of treatment options, and is a complaint that carries with it a high risk of complications and morbidity.  The differential diagnosis includes acute cystitis, pyelonephritis, appendicitis, cholecystitis, others   Co morbidities that complicate the patient evaluation  Back pain, anxiety, depression   Lab Tests:  I Ordered, and personally interpreted labs.  The pertinent results include: Leukocytosis with white count 14,700, UA with large leukocytes, greater than 50 WBC concerning for infection; wet prep with clue cells and greater than 10 WBC; negative pregnancy test; gonorrhea chlamydia probe pending   Imaging Studies ordered:  Patient has no tenderness on abdominal exam.  No signs of surgical/acute abdomen.  No indication for emergent imaging   Problem List / ED Course / Critical interventions / Medication management   I ordered medication including Rocephin  and Keflex  for antibiotic coverage Reevaluation of the patient after these medicines showed that the patient stayed  the same I have reviewed the patients home medicines and have made adjustments as needed   Social Determinants of Health:  Social Drivers of Health with Concerns   Financial Resource Strain: High Risk (11/12/2022)   Received from [facility]   Overall Financial Resource Strain (CARDIA)    Difficulty of Paying Living Expenses: Very hard  Food Insecurity: Food Insecurity Present (11/12/2022)   [facility] from [facility]   Hunger Vital Sign    Worried About Running Out of Food in the Last Year: Sometimes true  Ran Out of Food in the Last Year: Often true  Transportation Needs: Unmet Transportation Needs (11/12/2022)   Received from [facility] - Transportation    Lack of Transportation (Medical): No    Lack of Transportation (Non-Medical): Yes  Stress: Stress Concern Present (11/12/2022)   Received from [facility] - Occupational Stress Questionnaire    Feeling of Stress : Very much  Social Connections: Somewhat Isolated (11/12/2022)   [facility] from [facility]   Social Network    How would you rate your social network (family, work, friends)?: Restricted participation with some degree of social isolation  Depression (PHQ2-9): High Risk (06/08/2021)   Depression (PHQ2-9)    PHQ-2 Score: 21  Housing: Not on file  Health Literacy: Not on file      Test / Admission - Considered:  Patient with workup concerning for possible UTI, possible STI.  Wet prep consistent with bacterial vaginosis.  Patient with likely exposure to STI.  Will treat empirically at this time.  Patient may follow MyChart for results of gonorrhea chlamydia testing.  If negative she may discontinue doxycycline .  Will discharge with antibiotics for UTI and bacterial vaginosis.  No indication for further emergent workup or admission.         Final Clinical Impression(s) / ED Diagnoses Final diagnoses:  Lower abdominal pain  Lower urinary tract  infectious disease  Bacterial vaginosis  Possible exposure to STI    Rx / DC Orders ED Discharge Orders          Ordered    cephALEXin  (KEFLEX ) 500 MG capsule  4 times daily        02/20/23 0145    ondansetron  (ZOFRAN ) 4 MG tablet  Every 8 hours PRN        02/20/23 0146    doxycycline  (VIBRA -TABS) 100 MG tablet  2 times daily        02/20/23 0218    metroNIDAZOLE  (FLAGYL ) 500 MG tablet  2 times daily        02/20/23 0234              Logan Ubaldo NOVAK, PA-C 02/20/23 0236    Bari Charmaine FALCON, MD 02/20/23 [phone]

## 2023-02-21 LAB — GC/CHLAMYDIA PROBE AMP (~~LOC~~) NOT AT ARMC
Chlamydia: NEGATIVE
Comment: NEGATIVE
Comment: NORMAL
Neisseria Gonorrhea: NEGATIVE

## 2023-06-04 ENCOUNTER — Other Ambulatory Visit (INDEPENDENT_AMBULATORY_CARE_PROVIDER_SITE_OTHER): Payer: Self-pay

## 2023-06-04 ENCOUNTER — Ambulatory Visit

## 2023-06-04 VITALS — BP 115/78 | HR 97 | Wt 242.5 lb

## 2023-06-04 DIAGNOSIS — O3680X0 Pregnancy with inconclusive fetal viability, not applicable or unspecified: Secondary | ICD-10-CM

## 2023-06-04 DIAGNOSIS — O0991 Supervision of high risk pregnancy, unspecified, first trimester: Secondary | ICD-10-CM

## 2023-06-04 DIAGNOSIS — Z1339 Encounter for screening examination for other mental health and behavioral disorders: Secondary | ICD-10-CM

## 2023-06-04 DIAGNOSIS — O0990 Supervision of high risk pregnancy, unspecified, unspecified trimester: Secondary | ICD-10-CM | POA: Insufficient documentation

## 2023-06-04 DIAGNOSIS — Z3A12 12 weeks gestation of pregnancy: Secondary | ICD-10-CM | POA: Diagnosis not present

## 2023-06-04 MED ORDER — BLOOD PRESSURE KIT DEVI: 1.0000 | 0 refills | Status: AC

## 2023-06-04 NOTE — Progress Notes (Signed)
 New OB Intake  I connected with Barbara [name]  on 06/04/23 at  9:15 AM EDT by In Person Visit and verified that I am speaking with the correct person using two identifiers. Nurse is located at [facility] and pt is located at [place].  I discussed the limitations, risks, security and privacy concerns of performing an evaluation and management service by telephone and the availability of in person appointments. I also discussed with the patient that there may be a patient responsible charge related to this service. The patient expressed understanding and agreed to proceed.  I explained I am completing New OB Intake today. We discussed EDD of Not found.. Pt is [id number]. I reviewed her allergies, medications and Medical/Surgical/OB history.    Patient Active Problem List   Diagnosis Date Noted   Mild episode of recurrent major depressive disorder (HCC) 04/26/2021   Trouble in sleeping 04/26/2021   Sleep disturbances 04/26/2021   Anxiety state 04/26/2021   Severe recurrent major depression without psychotic features (HCC) 02/28/2021   BMI 30s 12/28/2020   Supervision of other normal pregnancy, antepartum 12/28/2020   Missed abortion 12/28/2020   History of gestational hypertension 05/01/2017   History of gestational diabetes mellitus (GDM) 03/14/2017   Family history of cleft palate 02/05/2017   Narcolepsy 09/19/2016   Depressed mood with postpartum onset 01/18/2014     Concerns addressed today  Delivery Plans Plans to deliver at [facility]. Discussed the nature of our practice with multiple providers including residents and students. Due to the size of the practice, the delivering provider may not be the same as those providing prenatal care.   Patient is not interested in water birth.  MyChart/Babyscripts MyChart access verified. I explained pt will have some visits in office and some virtually. Babyscripts instructions given and order placed. Patient verifies receipt of registration  text/e-mail. Account successfully created and app downloaded. If patient is a candidate for Optimized scheduling, add to sticky note.   Blood Pressure Cuff/Weight Scale Blood pressure cuff ordered for patient to pick-up from [employer]. Explained after first prenatal appt pt will check weekly and document in Babyscripts. Patient does not have weight scale; patient may purchase if they desire to track weight weekly in Babyscripts.  Anatomy US  Explained first scheduled US  will be around 19 weeks. Anatomy US  scheduled for TBD at TBD.  Is patient a candidate for Babyscripts Optimization? No, due to Risk Factors   First visit review I reviewed new OB appt with patient. Explained pt will be seen by Dr. Willey Harrier at first visit. Discussed [facility] [place] genetic screening with patient. Requests Panorama and Horizon.. Routine prenatal labs collected at today's visit.   Last Pap Diagnosis  Date Value Ref Range Status  06/25/2017   Final   NEGATIVE FOR INTRAEPITHELIAL LESIONS OR MALIGNANCY.    Donette Furlong, RN 06/04/2023  8:46 AM

## 2023-06-04 NOTE — Patient Instructions (Signed)

## 2023-06-05 ENCOUNTER — Encounter: Payer: Self-pay | Admitting: Obstetrics and Gynecology

## 2023-06-05 LAB — CBC/D/PLT+RPR+RH+ABO+RUBIGG...
Antibody Screen: NEGATIVE
Basophils Absolute: 0 10*3/uL (ref 0.0–0.2)
Basos: 0 %
EOS (ABSOLUTE): 0.1 10*3/uL (ref 0.0–0.4)
Eos: 0 %
HCV Ab: NONREACTIVE
HIV Screen 4th Generation wRfx: NONREACTIVE
Hematocrit: 40.4 % (ref 34.0–46.6)
Hemoglobin: 13.2 g/dL (ref 11.1–15.9)
Hepatitis B Surface Ag: NEGATIVE
Immature Grans (Abs): 0.1 10*3/uL (ref 0.0–0.1)
Immature Granulocytes: 1 %
Lymphocytes Absolute: 2.1 10*3/uL (ref 0.7–3.1)
Lymphs: 16 %
MCH: 28.4 pg (ref 26.6–33.0)
MCHC: 32.7 g/dL (ref 31.5–35.7)
MCV: 87 fL (ref 79–97)
Monocytes Absolute: 0.5 10*3/uL (ref 0.1–0.9)
Monocytes: 4 %
Neutrophils Absolute: 10.8 10*3/uL — ABNORMAL HIGH (ref 1.4–7.0)
Neutrophils: 79 %
Platelets: 291 10*3/uL (ref 150–450)
RBC: 4.65 x10E6/uL (ref 3.77–5.28)
RDW: 13.2 % (ref 11.7–15.4)
RPR Ser Ql: NONREACTIVE
Rh Factor: POSITIVE
Rubella Antibodies, IGG: 1.54 {index} (ref >0.99)
WBC: 13.7 10*3/uL — ABNORMAL HIGH (ref 3.4–10.8)

## 2023-06-05 LAB — COMPREHENSIVE METABOLIC PANEL WITH GFR
ALT: 22 IU/L (ref 0–32)
AST: 18 IU/L (ref 0–40)
Albumin: 4.1 g/dL (ref 4.0–5.0)
Alkaline Phosphatase: 59 IU/L (ref 44–121)
BUN/Creatinine Ratio: 14 (ref 9–23)
BUN: 8 mg/dL (ref 6–20)
Bilirubin Total: 0.3 mg/dL (ref 0.0–1.2)
CO2: 19 mmol/L — ABNORMAL LOW (ref 20–29)
Calcium: 9.5 mg/dL (ref 8.7–10.2)
Chloride: 100 mmol/L (ref 96–106)
Creatinine, Ser: 0.57 mg/dL (ref 0.57–1.00)
Globulin, Total: 3 g/dL (ref 1.5–4.5)
Glucose: 85 mg/dL (ref 70–99)
Potassium: 4.2 mmol/L (ref 3.5–5.2)
Sodium: 138 mmol/L (ref 134–144)
Total Protein: 7.1 g/dL (ref 6.0–8.5)
eGFR: 126 mL/min/{1.73_m2} (ref >59)

## 2023-06-05 LAB — HEMOGLOBIN A1C
Est. average glucose Bld gHb Est-mCnc: 111 mg/dL
Hgb A1c MFr Bld: 5.5 % (ref 4.8–5.6)

## 2023-06-05 LAB — HCV INTERPRETATION

## 2023-06-06 LAB — CULTURE, OB URINE

## 2023-06-06 LAB — URINE CULTURE, OB REFLEX

## 2023-06-09 ENCOUNTER — Ambulatory Visit: Admitting: Obstetrics & Gynecology

## 2023-06-09 ENCOUNTER — Other Ambulatory Visit (HOSPITAL_COMMUNITY)
Admission: RE | Admit: 2023-06-09 | Discharge: 2023-06-09 | Disposition: A | Discharge status: Home / Self Care | Source: Ambulatory Visit | Attending: Obstetrics & Gynecology | Admitting: Obstetrics & Gynecology

## 2023-06-09 ENCOUNTER — Encounter: Payer: Self-pay | Admitting: Obstetrics & Gynecology

## 2023-06-09 VITALS — BP 120/89 | HR 82 | Wt 240.0 lb

## 2023-06-09 DIAGNOSIS — O0991 Supervision of high risk pregnancy, unspecified, first trimester: Secondary | ICD-10-CM | POA: Diagnosis not present

## 2023-06-09 DIAGNOSIS — Z8632 Personal history of gestational diabetes: Secondary | ICD-10-CM

## 2023-06-09 DIAGNOSIS — Z124 Encounter for screening for malignant neoplasm of cervix: Secondary | ICD-10-CM | POA: Diagnosis not present

## 2023-06-09 DIAGNOSIS — O0990 Supervision of high risk pregnancy, unspecified, unspecified trimester: Secondary | ICD-10-CM

## 2023-06-09 DIAGNOSIS — Z8759 Personal history of other complications of pregnancy, childbirth and the puerperium: Secondary | ICD-10-CM | POA: Diagnosis not present

## 2023-06-09 DIAGNOSIS — Z3A13 13 weeks gestation of pregnancy: Secondary | ICD-10-CM | POA: Diagnosis present

## 2023-06-09 DIAGNOSIS — Z113 Encounter for screening for infections with a predominantly sexual mode of transmission: Secondary | ICD-10-CM | POA: Insufficient documentation

## 2023-06-09 DIAGNOSIS — Z1151 Encounter for screening for human papillomavirus (HPV): Secondary | ICD-10-CM | POA: Diagnosis not present

## 2023-06-09 NOTE — Progress Notes (Signed)
 Subjective: doing well    Barbara [name] is a [id number] [redacted]w[redacted]d being seen today for her first obstetrical visit.  Her obstetrical history is significant for obesity and h/o GDM . Patient does intend to breast feed. Pregnancy history fully reviewed.  Patient reports no complaints.  Vitals:   06/09/23 1019  BP: 120/89  Pulse: 82  Weight: 240 lb (108.9 kg)    HISTORY: OB History  Gravida Para Term Preterm AB Living  4 2 2  0 1 2  SAB IAB Ectopic Multiple Live Births  1 0 0 0 2    # Outcome Date GA Lbr Len/2nd Weight Sex Type Anes PTL Lv  4 Current           3 SAB 12/2021     SAB     2 Term 05/02/17 [redacted]w[redacted]d 03:30 / 00:20 8 lb 12.2 oz (3.975 kg) M Vag-Spont EPI  LIV  1 Term 02/24/13 [redacted]w[redacted]d 22:29 / 00:19 8 lb 6.4 oz (3.81 kg) M Vag-Spont EPI  LIV   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, also been evaluated by neurology   UTI (urinary tract infection)    Past Surgical History:  Procedure Laterality Date   NO PAST SURGERIES     Family History  Problem Relation Age of Onset   Depression Mother    Schizophrenia Father    Healthy Sister    Healthy Brother    Schizophrenia Maternal Uncle    Hypertension Maternal Grandmother    Depression Maternal Grandmother    Asthma Maternal Grandmother    Diabetes Maternal Grandmother    Heart disease Maternal Grandmother    Hearing loss Maternal Grandfather    COPD Maternal Grandfather      Exam    Uterus:   13 weeks  Pelvic Exam:    Perineum: No Hemorrhoids   Vulva: normal   Vagina:  normal mucosa   pH:    Cervix: no lesions   Adnexa: normal adnexa   Bony Pelvis: average  System: Breast:  normal appearance, no masses or tenderness   Skin: normal coloration and turgor, no  rashes    Neurologic: oriented, normal mood   Extremities: normal strength, tone, and muscle mass   HEENT PERRLA   Mouth/Teeth dental hygiene poor and broken front tooth   Neck supple and no masses   Cardiovascular: regular rate and rhythm, no murmurs or gallops   Respiratory:  appears well, vitals normal, no respiratory distress, acyanotic, normal RR, chest clear, no wheezing, crepitations, rhonchi, normal symmetric air entry   Abdomen: soft, non-tender; bowel sounds normal; no masses,  no organomegaly   Urinary: urethral meatus normal      Assessment:    Pregnancy: [id number] Patient Active Problem List   Diagnosis Date Noted   Supervision of high risk pregnancy, antepartum 06/04/2023   Mild episode of recurrent major depressive disorder (HCC) 04/26/2021   Sleep disturbances 04/26/2021   Anxiety state 04/26/2021   Severe recurrent major depression without psychotic features (HCC) 02/28/2021   BMI 30s 12/28/2020   History of gestational hypertension 05/01/2017   History of gestational diabetes mellitus (GDM) 03/14/2017   Family history of cleft palate 02/05/2017  Narcolepsy 09/19/2016        Plan:     Initial labs drawn. Prenatal vitamins. Problem list reviewed and updated. Genetic Screening discussed : results reviewed.  Ultrasound discussed; fetal survey: ordered.  Follow up in 4 weeks. 50% of 30 min visit spent on counseling and coordination of care.  Consider dental referral   Onnie Bilis 06/09/2023

## 2023-06-09 NOTE — Progress Notes (Signed)
   See note  Attestation of Attending Supervision of Medical Student: Evaluation and management procedures were performed by the medical student under my supervision and collaboration.  I have reviewed the student's note and chart, and I agree with the management and plan.  Onnie Bilis, MD, FACOG Attending Obstetrician & Gynecologist Faculty Practice, [facility]

## 2023-06-11 LAB — CYTOLOGY - PAP
Comment: NEGATIVE
Diagnosis: NEGATIVE
Diagnosis: REACTIVE
High risk HPV: NEGATIVE

## 2023-06-11 LAB — HORIZON CUSTOM: REPORT SUMMARY: NEGATIVE

## 2023-06-12 LAB — CERVICOVAGINAL ANCILLARY ONLY
Chlamydia: NEGATIVE
Comment: NEGATIVE
Comment: NORMAL
Neisseria Gonorrhea: NEGATIVE

## 2023-06-16 LAB — PANORAMA PRENATAL TEST FULL PANEL:PANORAMA TEST PLUS 5 ADDITIONAL MICRODELETIONS: FETAL FRACTION: 2.9

## 2023-06-18 ENCOUNTER — Other Ambulatory Visit

## 2023-06-18 DIAGNOSIS — Z3482 Encounter for supervision of other normal pregnancy, second trimester: Secondary | ICD-10-CM

## 2023-06-20 ENCOUNTER — Other Ambulatory Visit (HOSPITAL_COMMUNITY): Payer: Self-pay

## 2023-06-25 ENCOUNTER — Other Ambulatory Visit: Payer: Self-pay

## 2023-06-25 MED ORDER — PROMETHAZINE HCL 25 MG PO TABS: 25.0000 mg | ORAL_TABLET | Freq: Four times a day (QID) | ORAL | 1 refills | Status: DC | PRN

## 2023-06-25 NOTE — Progress Notes (Signed)
 Phenergan  rx sent per protocol for nausea

## 2023-06-26 LAB — PANORAMA PRENATAL TEST FULL PANEL:PANORAMA TEST PLUS 5 ADDITIONAL MICRODELETIONS: FETAL FRACTION: 4.1

## 2023-06-27 ENCOUNTER — Ambulatory Visit: Payer: Self-pay | Admitting: Obstetrics and Gynecology

## 2023-07-08 ENCOUNTER — Ambulatory Visit (INDEPENDENT_AMBULATORY_CARE_PROVIDER_SITE_OTHER): Admitting: Obstetrics & Gynecology

## 2023-07-08 ENCOUNTER — Encounter: Payer: Self-pay | Admitting: Obstetrics & Gynecology

## 2023-07-08 VITALS — BP 118/80 | HR 92 | Wt 246.1 lb

## 2023-07-08 DIAGNOSIS — O0990 Supervision of high risk pregnancy, unspecified, unspecified trimester: Secondary | ICD-10-CM

## 2023-07-08 DIAGNOSIS — Z8632 Personal history of gestational diabetes: Secondary | ICD-10-CM

## 2023-07-08 DIAGNOSIS — Z3A17 17 weeks gestation of pregnancy: Secondary | ICD-10-CM | POA: Diagnosis not present

## 2023-07-08 DIAGNOSIS — Z8759 Personal history of other complications of pregnancy, childbirth and the puerperium: Secondary | ICD-10-CM | POA: Diagnosis not present

## 2023-07-08 DIAGNOSIS — O0992 Supervision of high risk pregnancy, unspecified, second trimester: Secondary | ICD-10-CM

## 2023-07-08 NOTE — Progress Notes (Signed)
   PRENATAL VISIT NOTE  Subjective:  Barbara [name] is a 28 y.o. [phone] at [redacted]w[redacted]d being seen today for ongoing prenatal care.  She is currently monitored for the following issues for this high-risk pregnancy and has Narcolepsy; Family history of cleft palate; History of gestational diabetes mellitus (GDM); History of gestational hypertension; BMI 30s; Severe recurrent major depression without psychotic features (HCC); Mild episode of recurrent major depressive disorder (HCC); Sleep disturbances; Anxiety state; and Supervision of high risk pregnancy, antepartum on their problem list.  Patient reports round ligament pain.  Contractions: Not present. Vag. Bleeding: None.  Movement: Present. Denies leaking of fluid.   The following portions of the patient's history were reviewed and updated as appropriate: allergies, current medications, past family history, past medical history, past social history, past surgical history and problem list.   Objective:    Vitals:   07/08/23 0923  BP: 118/80  Pulse: 92  Weight: 246 lb 1.6 oz (111.6 kg)    Fetal Status:  Fetal Heart Rate (bpm): 145   Movement: Present    General: Alert, oriented and cooperative. Patient is in no acute distress.  Skin: Skin is warm and dry. No rash noted.   Cardiovascular: Normal heart rate noted  Respiratory: Normal respiratory effort, no problems with respiration noted  Abdomen: Soft, gravid, appropriate for gestational age.  Pain/Pressure: Present     Pelvic: Cervical exam deferred        Extremities: Normal range of motion.  Edema: None  Mental Status: Normal mood and affect. Normal behavior. Normal judgment and thought content.   Assessment and Plan:  Pregnancy: [id number] at [redacted]w[redacted]d 1. Supervision of high risk pregnancy, antepartum (Primary) Orders Placed This Encounter  Procedures   AFP, Serum, Open Spina Bifida    Is patient insulin dependent?:   No    Weight (lbs):   246    Gestational Age (GA), weeks:   17.3     Date on which patient was at this GA:   07/08/2023    GA Calculation Method:   Ultrasound    Number of fetuses:   1    Donor egg?:   N    Remote health to draw?:   No     2. History of gestational hypertension Nl BP  3. History of gestational diabetes mellitus (GDM)   Preterm labor symptoms and general obstetric precautions including but not limited to vaginal bleeding, contractions, leaking of fluid and fetal movement were reviewed in detail with the patient. Please refer to After Visit Summary for other counseling recommendations.   Return in about 4 weeks (around 08/05/2023).  Future Appointments  Date Time Provider Department Center  07/28/2023  8:00 AM [facility] PROVIDER 1 [facility]  07/28/2023  8:30 AM [facility] US1 [facility]    Onnie Bilis, MD

## 2023-07-08 NOTE — Progress Notes (Signed)
 Sharp pains in lower abdomen and left side since yesterday (scale of 10).  No other concerns at this time.

## 2023-07-10 LAB — AFP, SERUM, OPEN SPINA BIFIDA
AFP MoM: 0.76
AFP Value: 22.2 ng/mL
Gest. Age on Collection Date: 17.3 wk
Maternal Age At EDD: 29.7 a
OSBR Risk 1 IN: 10000
Test Results:: NEGATIVE
Weight: 246 [lb_av]

## 2023-07-28 ENCOUNTER — Other Ambulatory Visit: Payer: Self-pay | Admitting: *Deleted

## 2023-07-28 ENCOUNTER — Ambulatory Visit: Attending: Obstetrics & Gynecology

## 2023-07-28 ENCOUNTER — Ambulatory Visit: Admitting: Maternal & Fetal Medicine

## 2023-07-28 ENCOUNTER — Ambulatory Visit

## 2023-07-28 VITALS — BP 130/77 | HR 97

## 2023-07-28 DIAGNOSIS — Z8279 Family history of other congenital malformations, deformations and chromosomal abnormalities: Secondary | ICD-10-CM | POA: Insufficient documentation

## 2023-07-28 DIAGNOSIS — Z8759 Personal history of other complications of pregnancy, childbirth and the puerperium: Secondary | ICD-10-CM | POA: Insufficient documentation

## 2023-07-28 DIAGNOSIS — Z3A20 20 weeks gestation of pregnancy: Secondary | ICD-10-CM

## 2023-07-28 DIAGNOSIS — E669 Obesity, unspecified: Secondary | ICD-10-CM

## 2023-07-28 DIAGNOSIS — Z8632 Personal history of gestational diabetes: Secondary | ICD-10-CM

## 2023-07-28 DIAGNOSIS — O99212 Obesity complicating pregnancy, second trimester: Secondary | ICD-10-CM

## 2023-07-28 DIAGNOSIS — O09292 Supervision of pregnancy with other poor reproductive or obstetric history, second trimester: Secondary | ICD-10-CM | POA: Diagnosis not present

## 2023-07-28 DIAGNOSIS — O0990 Supervision of high risk pregnancy, unspecified, unspecified trimester: Secondary | ICD-10-CM | POA: Diagnosis present

## 2023-07-28 MED ORDER — ASPIRIN 81 MG PO TBEC: 81.0000 mg | DELAYED_RELEASE_TABLET | Freq: Every day | ORAL | 4 refills | Status: DC

## 2023-07-28 NOTE — Progress Notes (Signed)
 Patient information  Patient Name: Barbara [name]  Patient MRN:   [id number]  Referring practice: MFM Referring Provider: [facility]  Problem List   Patient Active Problem List   Diagnosis Date Noted   Supervision of high risk pregnancy, antepartum 06/04/2023   Mild episode of recurrent major depressive disorder (HCC) 04/26/2021   Sleep disturbances 04/26/2021   Anxiety state 04/26/2021   Severe recurrent major depression without psychotic features (HCC) 02/28/2021   BMI 30s 12/28/2020   History of gestational hypertension 05/01/2017   History of gestational diabetes mellitus (GDM) 03/14/2017   Family history of cleft palate 02/05/2017   Narcolepsy 09/19/2016   Maternal Fetal Medicine Consult Barbara [name] is a 28 y.o. [id number] at [redacted]w[redacted]d here for ultrasound and consultation. She had Low risk of a female fetus. Carrier screening was Negative for the basic screening (SMA, alpha-thal, beta-thal, and cystic fibroisis. Maternal serum AFPwas negative. She has no acute concerns. Today we focused on the following:   Elevated BMI and history of GHTN and GDM: I discussed the potential complications associated with obesity in pregnancy.  These complications include but are not limited to increased risk of excessive maternal weight gain, fetal growth abnormalities, fetal congenital disorders, inability to visualize fetal anatomic structures on ultrasound, gestational diabetes, hypertensive disorders of pregnancy, operative birth including cesarean delivery or assisted vaginal delivery, delayed wound healing and many long-term health complications.  I discussed the need for continued growth ultrasounds and possibly antenatal testing depending upon how the pregnancy course progresses.  Maternal weight gain should be limited to 10 to 20 pounds during the pregnancy.  While normal weight loss may occur during the first and early second trimester, efforts to actively lose weight with the use of  medication is not recommended during pregnancy.  A whole food diet and regular exercise of at least 15 to 30 minutes of moderately strenuous activity is recommended in the absence of any contraindications. Weight loss with the use of medications is not recommended during pregnancy.  I discussed the risk and impact of preeclampsia on her pregnancy and the role of baseline laboratory assessment of kidney, liver and platelet count as well as the role of low dose Aspirin to the reduce the risk of developing preeclampsia. I reassured the patient that we expect a favorable pregnancy outcome but due to her pregnancy complications she will need a higher level of monitoring for her pregnancy compared to a pregnancy without complications. The patient had time to ask questions that were answered to her satisfaction. She verbalized understanding of our discussion and agreed to proceed with the plan outlined in the recommendations.   Family history of cleft lip/palate: the patient's mother has this history and no other people on either side have a cleft lip/palate.   Sonographic findings Single intrauterine pregnancy at 20w 2d  Fetal cardiac activity:  Observed and appears normal. Presentation: Cephalic. The anatomic structures that were well seen appear normal without evidence of soft markers. Due to poor acoustic windows some structures remain suboptimally visualized. Fetal biometry shows the estimated fetal weight at the 59 percentile.  Amniotic fluid: Within normal limits.  MVP: 4.25 cm. Placenta: Posterior Fundal. Adnexa: No abnormality visualized. Cervical length: 4.4 cm.  Recommendations - EDD should be 12/13/2023 based on  Early Ultrasound  (06/04/23). - Anatomy ultrasound was done today with the above findings (see report). - Aspirin 81 mg will be prescribed for preeclampsia prophylaxis. Despite having a corn allergy, the patient has taken  aspirin without complication. - Baseline labs: CMP, CBC,  urine protein creatinine ratio. - Blood pressure goal of < 140 systolic and < 90 diastolic. Antihypertensive medication should be added/adjusted until BP goal is achieved.  - Early Glucola screening (or A1c of patient declines). - Serial growth ultrasounds every 4 weeks starting at 24 to 28 weeks until delivery. - Delivery likely around 39-[redacted] weeks gestation or sooner if indicated.  Review of Systems: A review of systems was performed and was negative except per HPI   Past Obstetrical History:  OB History  Gravida Para Term Preterm AB Living  4 2 2  0 1 2  SAB IAB Ectopic Multiple Live Births  1 0 0 0 2    # Outcome Date GA Lbr Len/2nd Weight Sex Type Anes PTL Lv  4 Current           3 SAB 12/2021     SAB     2 Term 05/02/17 [redacted]w[redacted]d 03:30 / 00:20 8 lb 12.2 oz (3.975 kg) M Vag-Spont EPI  LIV  1 Term 02/24/13 [redacted]w[redacted]d 22:29 / 00:19 8 lb 6.4 oz (3.81 kg) M Vag-Spont EPI  LIV     Past Medical History:  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, also been evaluated by neurology   UTI (urinary tract infection)      Past Surgical History:    Past Surgical History:  Procedure Laterality Date   NO PAST SURGERIES       Home Medications:   Current Outpatient Medications on File Prior to Visit  Medication Sig Dispense Refill   hydrOXYzine  (ATARAX ) 25 MG tablet Take 25 mg by mouth 3 (three) times daily as needed.     ondansetron  (ZOFRAN ) 4 MG tablet Take 1 tablet (4 mg total) by mouth every 8 (eight) hours as needed for nausea or vomiting. 20 tablet 0   Prenatal Vit-Fe Fumarate-FA (MULTIVITAMIN-PRENATAL) 27-0.8 MG TABS tablet Take 1 tablet by mouth daily.     promethazine  (PHENERGAN ) 25 MG tablet Take 1 tablet (25 mg total) by mouth every 6  (six) hours as needed for nausea or vomiting. 30 tablet 1   sertraline  (ZOLOFT ) 50 MG tablet Take 50 mg by mouth daily.     Blood Pressure Monitoring (BLOOD PRESSURE KIT) DEVI 1 Device by Does not apply route once a week. (Patient not taking: Reported on 07/08/2023) 1 each 0   ondansetron  (ZOFRAN -ODT) 8 MG disintegrating tablet Take 8 mg by mouth every 8 (eight) hours as needed for nausea or vomiting. (Patient not taking: Reported on 06/04/2023)     No current facility-administered medications on file prior to visit.      Allergies:   Allergies  Allergen Reactions   Corn-Containing Products Anaphylaxis and Swelling    Pt states that it is the silk that she is allergic to. Can eat can corn. Cannot shuck and touch skin and silk.   Percocet [Oxycodone -Acetaminophen ] Anaphylaxis and Other (See Comments)    States feels like chest is tightening up.   Other Itching and Nausea And Vomiting    [place]     Physical Exam:   Vitals:   07/28/23 0750  BP: 130/77  Pulse:  97   Sitting comfortably on the sonogram table Nonlabored breathing Normal rate and rhythm Abdomen is nontender  Thank you for the opportunity to be involved with this patient's care. Please let us  know if we can be of any further assistance.   30 minutes of time was spent reviewing the patient's chart including labs, imaging and documentation.  At least 50% of this time was spent with direct patient care discussing the diagnosis, management and prognosis of her care.  Darris Emery MFM, [place]   07/28/2023  9:31 AM

## 2023-08-05 ENCOUNTER — Encounter: Admitting: Advanced Practice Midwife

## 2023-08-06 ENCOUNTER — Ambulatory Visit: Admitting: Family Medicine

## 2023-08-06 VITALS — BP 117/85 | HR 98 | Wt 246.0 lb

## 2023-08-06 DIAGNOSIS — Z8759 Personal history of other complications of pregnancy, childbirth and the puerperium: Secondary | ICD-10-CM | POA: Diagnosis not present

## 2023-08-06 DIAGNOSIS — Z8632 Personal history of gestational diabetes: Secondary | ICD-10-CM | POA: Diagnosis not present

## 2023-08-06 DIAGNOSIS — G47411 Narcolepsy with cataplexy: Secondary | ICD-10-CM | POA: Diagnosis not present

## 2023-08-06 DIAGNOSIS — F332 Major depressive disorder, recurrent severe without psychotic features: Secondary | ICD-10-CM

## 2023-08-06 DIAGNOSIS — O0990 Supervision of high risk pregnancy, unspecified, unspecified trimester: Secondary | ICD-10-CM

## 2023-08-06 DIAGNOSIS — Z3A21 21 weeks gestation of pregnancy: Secondary | ICD-10-CM

## 2023-08-06 NOTE — Progress Notes (Unsigned)
 Pt states her appetite is gone, has to make herself to eat - then she gets sick.  Recommended smoothies/ protein shakes.  Pt also has Narcolepsy, would like to know if she can take any for it.

## 2023-08-07 ENCOUNTER — Other Ambulatory Visit

## 2023-08-07 DIAGNOSIS — O0990 Supervision of high risk pregnancy, unspecified, unspecified trimester: Secondary | ICD-10-CM

## 2023-08-07 MED ORDER — HYDROXYZINE HCL 25 MG PO TABS: 25.0000 mg | ORAL_TABLET | Freq: Three times a day (TID) | ORAL | 0 refills | Status: DC | PRN

## 2023-08-07 MED ORDER — SERTRALINE HCL 50 MG PO TABS: 50.0000 mg | ORAL_TABLET | Freq: Every day | ORAL | 0 refills | Status: DC

## 2023-08-07 NOTE — Progress Notes (Signed)
   PRENATAL VISIT NOTE  Subjective:  Barbara [name] is a 28 y.o. [phone] at [redacted]w[redacted]d being seen today for ongoing prenatal care.  She is currently monitored for the following issues for this high-risk pregnancy and has Narcolepsy; Family history of cleft palate; History of gestational diabetes mellitus (GDM); History of gestational hypertension; BMI 30s; Severe recurrent major depression without psychotic features (HCC); Mild episode of recurrent major depressive disorder (HCC); Anxiety state; and Supervision of high risk pregnancy, antepartum on their problem list.  Patient reports no bleeding, no contractions, no cramping, and no leaking.  Contractions: Not present. Vag. Bleeding: None.  Movement: Present. Denies leaking of fluid.   The following portions of the patient's history were reviewed and updated as appropriate: allergies, current medications, past family history, past medical history, past social history, past surgical history and problem list.   Objective:    Vitals:   08/06/23 1558  BP: 117/85  Pulse: 98  Weight: 246 lb (111.6 kg)    Fetal Status:  Fetal Heart Rate (bpm): 134   Movement: Present    General: Alert, oriented and cooperative. Patient is in no acute distress.  Skin: Skin is warm and dry. No rash noted.   Cardiovascular: Normal heart rate noted  Respiratory: Normal respiratory effort, no problems with respiration noted  Abdomen: Soft, gravid, appropriate for gestational age.  Pain/Pressure: Present     Pelvic: Cervical exam deferred        Extremities: Normal range of motion.     Mental Status: Normal mood and affect. Normal behavior. Normal judgment and thought content.   Assessment and Plan:  Pregnancy: [id number] at [redacted]w[redacted]d 1. Supervision of high risk pregnancy, antepartum (Primary) FHR BP appropriate today Continue routine prenatal care Discussed 4-week follow-up and GTT in 8 weeks  2. History of gestational hypertension Blood pressure within normal  limits today On aspirin  3. History of gestational diabetes mellitus (GDM) Patient needs to be scheduled for early GTT as soon as possible  4. Primary narcolepsy with cataplexy Patient previously prescribed methylphenidate.  Reports modafinil  does not work.  5. [redacted] weeks gestation of pregnancy  6. Severe recurrent major depression without psychotic features (HCC) Refilled Zoloft  today  Preterm labor symptoms and general obstetric precautions including but not limited to vaginal bleeding, contractions, leaking of fluid and fetal movement were reviewed in detail with the patient. Please refer to After Visit Summary for other counseling recommendations.   No follow-ups on file.  Future Appointments  Date Time Provider Department Center  09/01/2023  2:15 PM [facility] PROVIDER 1 [facility]  09/01/2023  2:30 PM [facility] US1 [facility]  09/02/2023  8:15 AM [facility] [place] LAB[facility], CNM [facility] None    Norleen LULLA Rover, MD

## 2023-08-08 ENCOUNTER — Other Ambulatory Visit: Payer: Self-pay

## 2023-08-08 ENCOUNTER — Encounter: Payer: Self-pay | Admitting: Obstetrics and Gynecology

## 2023-08-08 ENCOUNTER — Ambulatory Visit: Payer: Self-pay | Admitting: Obstetrics and Gynecology

## 2023-08-08 DIAGNOSIS — O24419 Gestational diabetes mellitus in pregnancy, unspecified control: Secondary | ICD-10-CM

## 2023-08-08 DIAGNOSIS — O0990 Supervision of high risk pregnancy, unspecified, unspecified trimester: Secondary | ICD-10-CM

## 2023-08-08 DIAGNOSIS — O24410 Gestational diabetes mellitus in pregnancy, diet controlled: Secondary | ICD-10-CM

## 2023-08-08 LAB — RPR: RPR Ser Ql: NONREACTIVE

## 2023-08-08 LAB — CBC
Hematocrit: 33.7 % — ABNORMAL LOW (ref 34.0–46.6)
Hemoglobin: 11.1 g/dL (ref 11.1–15.9)
MCH: 28.3 pg (ref 26.6–33.0)
MCHC: 32.9 g/dL (ref 31.5–35.7)
MCV: 86 fL (ref 79–97)
Platelets: 242 10*3/uL (ref 150–450)
RBC: 3.92 x10E6/uL (ref 3.77–5.28)
RDW: 13.1 % (ref 11.7–15.4)
WBC: 10.3 10*3/uL (ref 3.4–10.8)

## 2023-08-08 LAB — HIV ANTIBODY (ROUTINE TESTING W REFLEX): HIV Screen 4th Generation wRfx: NONREACTIVE

## 2023-08-08 LAB — GLUCOSE TOLERANCE, 2 HOURS W/ 1HR
Glucose, 1 hour: 204 mg/dL — ABNORMAL HIGH (ref 70–179)
Glucose, 2 hour: 134 mg/dL (ref 70–152)
Glucose, Fasting: 86 mg/dL (ref 70–91)

## 2023-08-08 MED ORDER — ACCU-CHEK SOFTCLIX LANCETS MISC: 12 refills | Status: DC

## 2023-08-08 MED ORDER — GLUCOSE BLOOD VI STRP: ORAL_STRIP | 12 refills | Status: DC

## 2023-08-08 MED ORDER — ACCU-CHEK GUIDE W/DEVICE KIT: 1.0000 | PACK | Freq: Four times a day (QID) | 0 refills | Status: DC

## 2023-08-20 ENCOUNTER — Ambulatory Visit

## 2023-08-20 DIAGNOSIS — O24419 Gestational diabetes mellitus in pregnancy, unspecified control: Secondary | ICD-10-CM

## 2023-08-25 ENCOUNTER — Other Ambulatory Visit: Payer: Self-pay

## 2023-08-25 DIAGNOSIS — F411 Generalized anxiety disorder: Secondary | ICD-10-CM

## 2023-08-25 DIAGNOSIS — F330 Major depressive disorder, recurrent, mild: Secondary | ICD-10-CM

## 2023-09-01 ENCOUNTER — Other Ambulatory Visit: Payer: Self-pay | Admitting: *Deleted

## 2023-09-01 ENCOUNTER — Ambulatory Visit: Attending: Obstetrics | Admitting: Maternal & Fetal Medicine

## 2023-09-01 ENCOUNTER — Ambulatory Visit (HOSPITAL_BASED_OUTPATIENT_CLINIC_OR_DEPARTMENT_OTHER)

## 2023-09-01 VITALS — BP 132/70 | HR 105

## 2023-09-01 DIAGNOSIS — O09292 Supervision of pregnancy with other poor reproductive or obstetric history, second trimester: Secondary | ICD-10-CM | POA: Diagnosis not present

## 2023-09-01 DIAGNOSIS — O24410 Gestational diabetes mellitus in pregnancy, diet controlled: Secondary | ICD-10-CM

## 2023-09-01 DIAGNOSIS — O99212 Obesity complicating pregnancy, second trimester: Secondary | ICD-10-CM | POA: Insufficient documentation

## 2023-09-01 DIAGNOSIS — O0990 Supervision of high risk pregnancy, unspecified, unspecified trimester: Secondary | ICD-10-CM

## 2023-09-01 DIAGNOSIS — Z8759 Personal history of other complications of pregnancy, childbirth and the puerperium: Secondary | ICD-10-CM | POA: Diagnosis present

## 2023-09-01 DIAGNOSIS — Z3A25 25 weeks gestation of pregnancy: Secondary | ICD-10-CM | POA: Insufficient documentation

## 2023-09-01 DIAGNOSIS — Z8632 Personal history of gestational diabetes: Secondary | ICD-10-CM | POA: Diagnosis present

## 2023-09-01 DIAGNOSIS — E669 Obesity, unspecified: Secondary | ICD-10-CM

## 2023-09-01 NOTE — Progress Notes (Signed)
 Patient information  Patient Name: Barbara [name]  Patient MRN:   [id number]  Referring practice: MFM Referring Provider: [facility]  Problem List   Patient Active Problem List   Diagnosis Date Noted   Supervision of high risk pregnancy, antepartum 06/04/2023   Mild episode of recurrent major depressive disorder (HCC) 04/26/2021   Anxiety state 04/26/2021   Severe recurrent major depression without psychotic features (HCC) 02/28/2021   BMI 30s 12/28/2020   History of gestational hypertension 05/01/2017   GDM (gestational diabetes mellitus) 03/14/2017   Family history of cleft palate 02/05/2017   Narcolepsy 09/19/2016    Maternal Fetal medicine Consult  Barbara [name] is a 28 y.o. [id number] at [redacted]w[redacted]d here for ultrasound and consultation. Barbara [name] is doing well today with no acute concerns. Today we focused on the following:   GDM: The patient reports that her blood sugars are suboptimally controlled.  Her fasting blood sugars are in the 90s to 100s and her postprandials are widely variable but as high as 150.  She is going to attempt to perform dietary discretion and increase her exercise for the next 1 to 2 weeks and bring her blood glucose log to her OB provider.  She reports that she has been on metformin in the past prior to pregnancy for weight loss and is agreeable to restart that if she needs to.  We discussed the importance of proper glycemic control and the potential obstetric implications as well as the need for future growth ultrasounds and possibly antenatal testing if blood sugars remain suboptimally controlled.  The patient had time to ask questions that were answered to her satisfaction.  She verbalized understanding and agrees to proceed with the plan below.  Sonographic findings Single intrauterine pregnancy at 25w 2d.  Fetal cardiac activity:  Observed and appears normal. Presentation: Breech. Interval fetal anatomy appears normal. Fetal biometry  shows the estimated fetal weight at the 59 percentile. Amniotic fluid volume: Within normal limits. MVP: 4.2 cm. Placenta: Posterior Fundal.  There are limitations of prenatal ultrasound such as the inability to detect certain abnormalities due to poor visualization. Various factors such as fetal position, gestational age and maternal body habitus may increase the difficulty in visualizing the fetal anatomy.    Recommendations - Follow-up growth ultrasound in 5 weeks - If greater than 50% of blood glucose values are not at goal then antidiabetic medication should be started, consider insulin first-line and then metformin second line.  Review of Systems: A review of systems was performed and was negative except per HPI   Vitals and Physical Exam    09/01/2023    2:25 PM 08/06/2023    3:58 PM 07/28/2023    7:50 AM  Vitals with BMI  Weight  246 lbs   Systolic 132 117 869  Diastolic 70 85 77  Pulse 105 98 97    Sitting comfortably on the sonogram table Nonlabored breathing Normal rate and rhythm Abdomen is nontender  Past pregnancies OB History  Gravida Para Term Preterm AB Living  4 2 2  0 1 2  SAB IAB Ectopic Multiple Live Births  1 0 0 0 2    # Outcome Date GA Lbr Len/2nd Weight Sex Type Anes PTL Lv  4 Current           28 SAB 12/2021     SAB     2 Term 05/02/17 [redacted]w[redacted]d 03:30 / 00:20 8 lb 12.2 oz (3.975 kg) M Vag-Spont EPI  LIV  1 Term 02/24/13 [redacted]w[redacted]d 22:29 / 00:19 8 lb 6.4 oz (3.81 kg) M Vag-Spont EPI  LIV     I spent 20 minutes reviewing the patients chart, including labs and images as well as counseling the patient about her medical conditions. Greater than 50% of the time was spent in direct face-to-face patient counseling.  Delora Smaller  MFM, [facility]   09/01/2023  3:37 PM

## 2023-09-02 ENCOUNTER — Encounter: Payer: Self-pay | Admitting: Advanced Practice Midwife

## 2023-09-02 ENCOUNTER — Ambulatory Visit (INDEPENDENT_AMBULATORY_CARE_PROVIDER_SITE_OTHER): Payer: Self-pay | Admitting: Advanced Practice Midwife

## 2023-09-02 ENCOUNTER — Other Ambulatory Visit (HOSPITAL_COMMUNITY): Payer: Self-pay

## 2023-09-02 VITALS — BP 132/73 | HR 104 | Wt 255.6 lb

## 2023-09-02 DIAGNOSIS — O24410 Gestational diabetes mellitus in pregnancy, diet controlled: Secondary | ICD-10-CM

## 2023-09-02 DIAGNOSIS — G47411 Narcolepsy with cataplexy: Secondary | ICD-10-CM

## 2023-09-02 DIAGNOSIS — O0990 Supervision of high risk pregnancy, unspecified, unspecified trimester: Secondary | ICD-10-CM

## 2023-09-02 DIAGNOSIS — O99352 Diseases of the nervous system complicating pregnancy, second trimester: Secondary | ICD-10-CM | POA: Diagnosis not present

## 2023-09-02 DIAGNOSIS — O99343 Other mental disorders complicating pregnancy, third trimester: Secondary | ICD-10-CM | POA: Diagnosis not present

## 2023-09-02 DIAGNOSIS — Z3A25 25 weeks gestation of pregnancy: Secondary | ICD-10-CM

## 2023-09-02 DIAGNOSIS — Z5971 Insufficient health insurance coverage: Secondary | ICD-10-CM

## 2023-09-02 DIAGNOSIS — Z8759 Personal history of other complications of pregnancy, childbirth and the puerperium: Secondary | ICD-10-CM

## 2023-09-02 DIAGNOSIS — F330 Major depressive disorder, recurrent, mild: Secondary | ICD-10-CM

## 2023-09-02 MED ORDER — BUPROPION HCL ER (XL) 300 MG PO TB24
300.0000 mg | ORAL_TABLET | Freq: Every day | ORAL | 5 refills | Status: AC
  Filled 2023-09-02 – 2023-09-03 (×2): qty 30, 30d supply, fill #0
  Filled 2023-09-30 – 2023-10-01 (×2): qty 30, 30d supply, fill #1

## 2023-09-02 MED ORDER — FAMOTIDINE 20 MG PO TABS
20.0000 mg | ORAL_TABLET | Freq: Two times a day (BID) | ORAL | 5 refills | Status: AC
  Filled 2023-09-02 – 2023-09-03 (×2): qty 60, 30d supply, fill #0
  Filled 2023-09-30 – 2023-10-01 (×2): qty 60, 30d supply, fill #1

## 2023-09-02 MED ORDER — TRAZODONE HCL 50 MG PO TABS
50.0000 mg | ORAL_TABLET | Freq: Every day | ORAL | 1 refills | Status: DC
  Filled 2023-09-02 – 2023-09-03 (×2): qty 30, 30d supply, fill #0
  Filled 2023-09-30: qty 30, 30d supply, fill #1

## 2023-09-02 MED ORDER — DEXCOM G7 SENSOR MISC
1.0000 | Freq: Every day | 5 refills | Status: DC
  Filled 2023-09-02 – 2023-09-07 (×4): qty 3, 30d supply, fill #0
  Filled 2023-09-12: qty 3, 3d supply, fill #0

## 2023-09-02 NOTE — Progress Notes (Signed)
 PRENATAL VISIT NOTE  Subjective:  Barbara [name] is a 28 y.o. [phone] at [redacted]w[redacted]d being seen today for ongoing prenatal care.  She is currently monitored for the following issues for this low-risk pregnancy and has Narcolepsy; Family history of cleft palate; GDM (gestational diabetes mellitus); History of gestational hypertension; BMI 30s; Severe recurrent major depression without psychotic features (HCC); Mild episode of recurrent major depressive disorder (HCC); Anxiety state; and Supervision of high risk pregnancy, antepartum on their problem list.  Patient reports heartburn.  Contractions: Not present. Vag. Bleeding: None.  Movement: Present. Denies leaking of fluid.   The following portions of the patient's history were reviewed and updated as appropriate: allergies, current medications, past family history, past medical history, past social history, past surgical history and problem list.   Objective:    Vitals:   09/02/23 0809  BP: 132/73  Pulse: (!) 104  Weight: 115.9 kg    Fetal Status:  Fetal Heart Rate (bpm): 138   Movement: Present    General: Alert, oriented and cooperative. Patient is in no acute distress.  Skin: Skin is warm and dry. No rash noted.   Cardiovascular: Normal heart rate noted  Respiratory: Normal respiratory effort, no problems with respiration noted  Abdomen: Soft, gravid, appropriate for gestational age.  Pain/Pressure: Present     Pelvic: Cervical exam deferred        Extremities: Normal range of motion.  Edema: Trace  Mental Status: Normal mood and affect. Normal behavior. Normal judgment and thought content.   Assessment and Plan:  Pregnancy: [id number] at [redacted]w[redacted]d 1. Supervision of high risk pregnancy, antepartum (Primary) BP WNL, good fetal movement  2. History of gestational hypertension Taking ASA, BP WNL  3. [redacted] weeks gestation of pregnancy Having heartburn and would like Rx. Pepcid  to pharmacy.   4. Mild episode of recurrent major  depressive disorder (HCC) Taking Zoloft  50mg  & waiting for appt with [facility]  5. Diet controlled gestational diabetes mellitus (GDM) in third trimester Has not met with Diabetes Educator, missed appt due to her son having an appt around the same time. She has been checking fastings 85-115s and 20 minutes after meals as that is what her grandmother with diabetes does. Discussed rescheduling missed appt & provided Dexcom in office today.  --Rx for Dexcom  6. Primary narcolepsy with cataplexy --Pt was taking narcolepsy medications that helped but can't take these in pregnancy. She also has cost/insurance issues (see below). --Discussed what has helped her in the past.  Pt was taking Wellbutrin  during the day and trazodone  at night before pregnancy.  Given pt debilitating symptoms of somnolence during the day, benefits of these medications outweigh any small risks in pregnancy. Rx for both sent to [facility] pharmacy.  - Ambulatory referral to Neurology  7. Insurance coverage problems --Pt has private insurance and pregnancy Medicaid, so is paying for medications despite Medicaid coverage. If Dexcom not covered, can change to lower cost meter. --Pregnancy navigator to follow up with patient and assist her in cancelling private insurance if possible --Medications sent to [facility] pharmacy so pharmacist can assist with filing proper insurance.  Follow up with [facility] indicates that medications are covered when both insurances are filed.  Pt to move other medications to [facility] as they need to be filled.  Preterm labor symptoms and general obstetric precautions including but not limited to vaginal bleeding, contractions, leaking of fluid and fetal movement were reviewed in detail with the patient. Please refer to After Visit Summary for  other counseling recommendations.   Follow up in 4 weeks  Future Appointments  Date Time Provider Department Center  10/06/2023 11:15 AM [facility] PROVIDER 1 [facility]  10/06/2023 11:30 AM  [facility] US4 [facility]     Elenor Mole, [facility] 09/02/23  Midwife Attestation:  I personally saw and evaluated the patient, performing the key elements of the service. I developed and verified the management plan that is described in the resident's/student's note, and I agree with the content with my edits above. VSS, HRR&R, Resp unlabored, Legs neg.    Olam Boards, CNM 5:40 PM

## 2023-09-02 NOTE — Progress Notes (Signed)
 Pt brought blood sugar log on her phone.  Pt states she had ultrasound yesterday. The Dr yesterday said something to her about going back on metformin.  Would like to discuss what to expect at 28 week visit.

## 2023-09-03 ENCOUNTER — Other Ambulatory Visit: Payer: Self-pay

## 2023-09-03 ENCOUNTER — Other Ambulatory Visit (HOSPITAL_COMMUNITY): Payer: Self-pay

## 2023-09-04 ENCOUNTER — Other Ambulatory Visit: Payer: Self-pay

## 2023-09-04 ENCOUNTER — Other Ambulatory Visit (HOSPITAL_COMMUNITY): Payer: Self-pay

## 2023-09-08 ENCOUNTER — Other Ambulatory Visit: Payer: Self-pay | Admitting: Advanced Practice Midwife

## 2023-09-08 ENCOUNTER — Other Ambulatory Visit: Payer: Self-pay

## 2023-09-08 ENCOUNTER — Other Ambulatory Visit (HOSPITAL_COMMUNITY): Payer: Self-pay

## 2023-09-08 ENCOUNTER — Ambulatory Visit: Admitting: Licensed Clinical Social Worker

## 2023-09-08 DIAGNOSIS — Z3A25 25 weeks gestation of pregnancy: Secondary | ICD-10-CM

## 2023-09-08 DIAGNOSIS — G47411 Narcolepsy with cataplexy: Secondary | ICD-10-CM

## 2023-09-08 NOTE — Progress Notes (Signed)
 Referral changed to [facility] r/t dx of narcolepsy.

## 2023-09-10 ENCOUNTER — Other Ambulatory Visit (HOSPITAL_COMMUNITY): Payer: Self-pay

## 2023-09-12 ENCOUNTER — Other Ambulatory Visit (HOSPITAL_COMMUNITY): Payer: Self-pay

## 2023-09-13 ENCOUNTER — Other Ambulatory Visit (HOSPITAL_COMMUNITY): Payer: Self-pay

## 2023-09-15 NOTE — BH Specialist Note (Signed)
 Virtual link was sent to the patient on this date and a call was provided. Patient no showed for visit.

## 2023-09-16 ENCOUNTER — Other Ambulatory Visit (HOSPITAL_COMMUNITY): Payer: Self-pay

## 2023-09-16 ENCOUNTER — Telehealth (HOSPITAL_COMMUNITY): Payer: Self-pay

## 2023-09-16 ENCOUNTER — Other Ambulatory Visit

## 2023-09-16 ENCOUNTER — Other Ambulatory Visit: Payer: Self-pay

## 2023-09-16 MED ORDER — DEXCOM G7 SENSOR MISC
1.0000 | Freq: Every day | 5 refills | Status: DC
  Filled 2023-09-16 – 2023-09-30 (×3): qty 3, 30d supply, fill #0

## 2023-09-17 ENCOUNTER — Other Ambulatory Visit (HOSPITAL_COMMUNITY): Payer: Self-pay

## 2023-09-17 ENCOUNTER — Other Ambulatory Visit (HOSPITAL_BASED_OUTPATIENT_CLINIC_OR_DEPARTMENT_OTHER): Payer: Self-pay

## 2023-09-17 ENCOUNTER — Telehealth (HOSPITAL_COMMUNITY): Payer: Self-pay

## 2023-09-17 NOTE — Progress Notes (Deleted)
 Patient was seen for Gestational Diabetes on 09/18/2023  Start time *** and End time ***   Estimated due date: 12/13/2023; [redacted]w[redacted]d   Clinical: Medications: *** Medical History: *** Lab Results  Component Value Date   HGBA1C 5.5 06/04/2023   Dietary and Lifestyle History: ***  Physical Activity: *** Stress: *** Sleep: ***  24 hr Recall:  First Meal:  *** Snack:  *** Second meal:  *** Snack:  *** Third meal:  *** Snack:  *** Beverages:  ***  NUTRITION INTERVENTION  Nutrition education (E-1) on the following topics:   Initial Follow-up  []  []  Definition of Gestational Diabetes []  []  Why dietary management is important in controlling blood glucose []  []  Effects each nutrient has on blood glucose levels []  []  Simple carbohydrates vs complex carbohydrates []  []  Fluid intake []  []  Creating a balanced meal plan []  []  Carbohydrate counting  []  []  When to check blood glucose levels []  []  Proper blood glucose monitoring techniques []  []  Effect of stress and stress reduction techniques  []  []  Exercise effect on blood glucose levels, appropriate exercise during pregnancy []  []  Importance of limiting caffeine and abstaining from alcohol and smoking []  []  Medications used for blood sugar control during pregnancy []  []  Hypoglycemia and rule of 15 []  []  Postpartum self care  Blood glucose monitor given: *** Lot # *** Exp: *** CBG: *** mg/dL  *** Patient has a meter prior to visit. Patient is *** testing pre breakfast and 2 hours after each meal. FBS: *** Postprandial: ***  Patient instructed to monitor glucose levels: FBS: 60 - <= 95 mg/dL; 2 hour: <= 879 mg/dL  Patient received handouts: Nutrition Diabetes and Pregnancy Carbohydrate Counting List Blood glucose log Snack ideas for diabetes during pregnancy  Patient will be seen for follow-up as needed.

## 2023-09-17 NOTE — Telephone Encounter (Signed)
 Pharmacy Patient Advocate Encounter  Received notification from [facility] that Prior Authorization for Dexcom G7 Sensor  has been DENIED.  Full denial letter will be uploaded to the media tab. See denial reason below.     PA #/Case ID/Reference #: [id number]

## 2023-09-17 NOTE — Telephone Encounter (Signed)
 PA request has been Received. New Encounter has been or will be created for follow up. For additional info see Pharmacy Prior Auth telephone encounter from 09/17/23.

## 2023-09-17 NOTE — Telephone Encounter (Signed)
 Pharmacy Patient Advocate Encounter   Received notification from Pt Calls Messages that prior authorization for Dexcom G7 Sensor  is required/requested.   Insurance verification completed.   The patient is insured through [facility] .   Per test claim: PA required; PA submitted to above mentioned insurance via CoverMyMeds Key/confirmation #/EOC [id number] Status is pending

## 2023-09-18 ENCOUNTER — Other Ambulatory Visit

## 2023-09-18 ENCOUNTER — Other Ambulatory Visit (HOSPITAL_COMMUNITY): Payer: Self-pay

## 2023-09-18 DIAGNOSIS — O24419 Gestational diabetes mellitus in pregnancy, unspecified control: Secondary | ICD-10-CM

## 2023-09-19 ENCOUNTER — Other Ambulatory Visit (HOSPITAL_COMMUNITY): Payer: Self-pay

## 2023-09-22 ENCOUNTER — Other Ambulatory Visit: Payer: Self-pay

## 2023-09-22 ENCOUNTER — Other Ambulatory Visit (HOSPITAL_COMMUNITY): Payer: Self-pay

## 2023-09-22 ENCOUNTER — Inpatient Hospital Stay (HOSPITAL_COMMUNITY)
Admission: AD | Admit: 2023-09-22 | Discharge: 2023-09-22 | Disposition: A | Discharge status: Home / Self Care | Attending: Family Medicine | Admitting: Family Medicine

## 2023-09-22 ENCOUNTER — Encounter (HOSPITAL_COMMUNITY): Payer: Self-pay | Admitting: Family Medicine

## 2023-09-22 DIAGNOSIS — Z3689 Encounter for other specified antenatal screening: Secondary | ICD-10-CM

## 2023-09-22 DIAGNOSIS — R110 Nausea: Secondary | ICD-10-CM

## 2023-09-22 DIAGNOSIS — O368130 Decreased fetal movements, third trimester, not applicable or unspecified: Secondary | ICD-10-CM | POA: Diagnosis present

## 2023-09-22 DIAGNOSIS — Z3A28 28 weeks gestation of pregnancy: Secondary | ICD-10-CM | POA: Diagnosis not present

## 2023-09-22 LAB — URINALYSIS, ROUTINE W REFLEX MICROSCOPIC
Bilirubin Urine: NEGATIVE
Glucose, UA: NEGATIVE mg/dL
Ketones, ur: NEGATIVE mg/dL
Nitrite: NEGATIVE
Protein, ur: 30 mg/dL — AB
Specific Gravity, Urine: >1.03 — ABNORMAL HIGH (ref 1.005–1.030)
pH: 6 (ref 5.0–8.0)

## 2023-09-22 LAB — URINALYSIS, MICROSCOPIC (REFLEX): RBC / HPF: NONE SEEN RBC/hpf (ref 0–5)

## 2023-09-22 MED ORDER — ONDANSETRON 4 MG PO TBDP
4.0000 mg | ORAL_TABLET | Freq: Three times a day (TID) | ORAL | 0 refills | Status: DC | PRN
  Filled 2023-09-22: qty 15, 5d supply, fill #0

## 2023-09-22 MED ORDER — ONDANSETRON 4 MG PO TBDP
8.0000 mg | ORAL_TABLET | Freq: Once | ORAL | Status: AC
  Administered 2023-09-22 (×2): 8 mg via ORAL
  Filled 2023-09-22: qty 2

## 2023-09-22 NOTE — MAU Note (Signed)
 Barbara [name] is a 28 y.o. at [redacted]w[redacted]d here in MAU reporting: has not felt FM since yesterday morning at 0300. Reports N/V yesterday with stomach tightening. Denies current pain, VB, or LOF.   LMP: NA Onset of complaint: 0300 yesterday  Pain score: 0 Vitals:   09/22/23 0542  BP: 138/89  Pulse: (!) 112  Resp: 18  Temp: 98 F (36.7 C)  SpO2: 100%     FHT: 142  Lab orders placed from triage: UA

## 2023-09-22 NOTE — MAU Provider Note (Signed)
 DFM    S [name] is a 28 y.o. [phone] pregnant female at [redacted]w[redacted]d who presents to MAU today with complaint of DFM. Pt states hasn't felt FM since 0300 on 8/10. States had some N/V 8/10 with some stomach tightening.  She states she had tried drinking water and getting in the bath tub however nothing has worked so decided to present for evaluation. Currently no pain, VB, or LOF.  FHT 142 bpm in triage.     Receives care at [facility]. Prenatal records reviewed. Posterior fundal placenta, followed by MFM for A1GDM and obesity and hx of GHTN  7/21 @[redacted]w[redacted]d , 844g, EFW 59%, AC 69%, nrl AFI (LVP 4.2cm)  Pertinent items noted in HPI and remainder of comprehensive ROS otherwise negative.   O BP 138/89 (BP Location: Right Arm)   Pulse (!) 112   Temp 98 F (36.7 C) (Oral)   Resp 18   Ht 5' 5 (1.651 m)   Wt 115.1 kg   LMP  (LMP Unknown)   SpO2 100%   BMI 42.23 kg/m  Physical Exam Constitutional:      General: She is not in acute distress.    Appearance: Normal appearance. She is obese. She is not ill-appearing.  HENT:     Head: Normocephalic and atraumatic.     Right Ear: External ear normal.     Left Ear: External ear normal.     Nose: Nose normal. No congestion.     Mouth/Throat:     Mouth: Mucous membranes are moist.     Pharynx: Oropharynx is clear.  Eyes:     Extraocular Movements: Extraocular movements intact.     Conjunctiva/sclera: Conjunctivae normal.  Cardiovascular:     Rate and Rhythm: Normal rate.  Pulmonary:     Effort: Pulmonary effort is normal. No respiratory distress.  Abdominal:     General: There is no distension.     Palpations: Abdomen is soft.     Tenderness: There is no abdominal tenderness.  Musculoskeletal:        General: No swelling. Normal range of motion.     Cervical back: Normal range of motion.  Skin:    General: Skin is warm and dry.  Neurological:     Mental Status: She is alert and oriented to person, place, and time. Mental status is  at baseline.     Motor: No weakness.     Gait: Gait normal.  Psychiatric:        Mood and Affect: Mood normal.        Behavior: Behavior normal.    Pt informed that the ultrasound is considered a limited OB ultrasound and is not intended to be a complete ultrasound exam.  Patient also informed that the ultrasound is not being completed with the intent of assessing for fetal or placental anomalies or any pelvic abnormalities.  Explained that the purpose of today's ultrasound is to assess for  viability.  Patient acknowledges the purpose of the exam and the limitations of the study.    My interpretation: FHR 135bpm and noted to have some slight movement on US   NST: 135bpm, moderate variability, +accels, no decels, no ctx, adequate self indicated movements   MDM: moderate MAU Course:  Pt nausea resolved with zofran .  Pt reports increased fetal movement back to baseline and reassuring NST.  Stable for d/c.    AP #[redacted] weeks gestation #DFM, resolved  #Reactive NST - fetal kick counts  - zofran  sent  -  f/u UA   Discharge from MAU in stable condition with strict/usual precautions Follow up at [facility] as scheduled for ongoing prenatal care  Allergies as of 09/22/2023       Reactions   Corn-containing Products Anaphylaxis, Swelling   Pt states that it is the silk that she is allergic to. Can eat can corn. Cannot shuck and touch skin and silk.   Percocet [oxycodone -acetaminophen ] Anaphylaxis, Other (See Comments)   States feels like chest is tightening up.   Other Itching, Nausea And Vomiting   Sherbert        Medication List     STOP taking these medications    ondansetron  4 MG tablet Commonly known as: Zofran        TAKE these medications    Accu-Chek Guide w/Device Kit 1 Device by Does not apply route 4 (four) times daily.   Accu-Chek Softclix Lancets lancets Check blood sugars 4 times daily; once when waking up, and 2 hours after each meal.   aspirin  EC 81 MG  tablet Take 1 tablet (81 mg total) by mouth daily. Swallow whole.   Blood Pressure Kit Devi 1 Device by Does not apply route once a week.   buPROPion  300 MG 24 hr tablet Commonly known as: Wellbutrin  XL Take 1 tablet (300 mg total) by mouth daily.   Dexcom G7 Sensor Misc Use as directed to check blood sugar continuously. Change sensor every 10 days   famotidine  20 MG tablet Commonly known as: Pepcid  Take 1 tablet (20 mg total) by mouth 2 (two) times daily.   glucose blood test strip Check blood sugars 4 times daily; once when waking up, and 2 hours after each meal.   hydrOXYzine  25 MG tablet Commonly known as: ATARAX  Take 1 tablet (25 mg total) by mouth 3 (three) times daily as needed.   multivitamin-prenatal 27-0.8 MG Tabs tablet Take 1 tablet by mouth daily.   ondansetron  8 MG disintegrating tablet Commonly known as: ZOFRAN -ODT Take 8 mg by mouth every 8 (eight) hours as needed for nausea or vomiting. What changed: Another medication with the same name was added. Make sure you understand how and when to take each.   ondansetron  4 MG disintegrating tablet Commonly known as: ZOFRAN -ODT Take 1 tablet (4 mg total) by mouth every 8 (eight) hours as needed for nausea or vomiting. What changed: You were already taking a medication with the same name, and this prescription was added. Make sure you understand how and when to take each.   promethazine  25 MG tablet Commonly known as: PHENERGAN  Take 1 tablet (25 mg total) by mouth every 6 (six) hours as needed for nausea or vomiting.   sertraline  50 MG tablet Commonly known as: ZOLOFT  Take 1 tablet (50 mg total) by mouth daily.   traZODone  50 MG tablet Commonly known as: DESYREL  Take 1 tablet (50 mg total) by mouth at bedtime.        Barbara Augustin BROCKS, MD 09/22/2023 7:09 AM

## 2023-09-23 ENCOUNTER — Other Ambulatory Visit: Payer: Self-pay

## 2023-09-23 ENCOUNTER — Other Ambulatory Visit (HOSPITAL_COMMUNITY): Payer: Self-pay

## 2023-09-23 ENCOUNTER — Encounter: Payer: Self-pay | Admitting: Pharmacist

## 2023-09-23 ENCOUNTER — Other Ambulatory Visit: Payer: Self-pay | Admitting: Advanced Practice Midwife

## 2023-09-23 MED ORDER — SERTRALINE HCL 50 MG PO TABS: 50.0000 mg | ORAL_TABLET | Freq: Every day | ORAL | 3 refills | Status: AC

## 2023-09-24 ENCOUNTER — Other Ambulatory Visit (HOSPITAL_COMMUNITY): Payer: Self-pay

## 2023-09-26 ENCOUNTER — Other Ambulatory Visit: Payer: Self-pay

## 2023-09-26 ENCOUNTER — Other Ambulatory Visit (HOSPITAL_COMMUNITY): Payer: Self-pay

## 2023-09-30 ENCOUNTER — Other Ambulatory Visit (HOSPITAL_BASED_OUTPATIENT_CLINIC_OR_DEPARTMENT_OTHER): Payer: Self-pay

## 2023-09-30 ENCOUNTER — Other Ambulatory Visit: Payer: Self-pay

## 2023-09-30 ENCOUNTER — Encounter: Payer: Self-pay | Admitting: Advanced Practice Midwife

## 2023-09-30 ENCOUNTER — Other Ambulatory Visit (HOSPITAL_COMMUNITY): Payer: Self-pay

## 2023-09-30 ENCOUNTER — Ambulatory Visit (INDEPENDENT_AMBULATORY_CARE_PROVIDER_SITE_OTHER): Admitting: Advanced Practice Midwife

## 2023-09-30 VITALS — BP 122/77 | HR 93 | Wt 254.4 lb

## 2023-09-30 DIAGNOSIS — Z3A29 29 weeks gestation of pregnancy: Secondary | ICD-10-CM | POA: Diagnosis not present

## 2023-09-30 DIAGNOSIS — O24410 Gestational diabetes mellitus in pregnancy, diet controlled: Secondary | ICD-10-CM | POA: Diagnosis not present

## 2023-09-30 DIAGNOSIS — F411 Generalized anxiety disorder: Secondary | ICD-10-CM

## 2023-09-30 DIAGNOSIS — Z8759 Personal history of other complications of pregnancy, childbirth and the puerperium: Secondary | ICD-10-CM

## 2023-09-30 DIAGNOSIS — O0990 Supervision of high risk pregnancy, unspecified, unspecified trimester: Secondary | ICD-10-CM | POA: Diagnosis not present

## 2023-09-30 DIAGNOSIS — F330 Major depressive disorder, recurrent, mild: Secondary | ICD-10-CM

## 2023-09-30 DIAGNOSIS — G47411 Narcolepsy with cataplexy: Secondary | ICD-10-CM

## 2023-09-30 MED ORDER — TRAZODONE HCL 50 MG PO TABS
100.0000 mg | ORAL_TABLET | Freq: Every day | ORAL | 1 refills | Status: AC
  Filled 2023-09-30: qty 30, 15d supply, fill #0

## 2023-09-30 NOTE — Progress Notes (Signed)
 Pt states she has been checking blood sugars, but she does not have her log with her today. She states her readings have been good.   Pt states insurance wont cover Dexcom.

## 2023-09-30 NOTE — Progress Notes (Signed)
   PRENATAL VISIT NOTE  Subjective:  Barbara [name] is a 28 y.o. [phone] at [redacted]w[redacted]d being seen today for ongoing prenatal care.  She is currently monitored for the following issues for this high-risk pregnancy and has Narcolepsy; Family history of cleft palate; GDM (gestational diabetes mellitus); History of gestational hypertension; BMI 30s; Severe recurrent major depression without psychotic features (HCC); Mild episode of recurrent major depressive disorder (HCC); Anxiety state; and Supervision of high risk pregnancy, antepartum on their problem list.  Patient reports no complaints.  Contractions: Irritability. Vag. Bleeding: None.  Movement: Increased. Denies leaking of fluid.   The following portions of the patient's history were reviewed and updated as appropriate: allergies, current medications, past family history, past medical history, past social history, past surgical history and problem list.   Objective:    Vitals:   09/30/23 0939  BP: 122/77  Pulse: 93  Weight: 115.4 kg    Fetal Status:  Fetal Heart Rate (bpm): 152 Fundal Height: 31 cm Movement: Increased    General: Alert, oriented and cooperative. Patient is in no acute distress.  Skin: Skin is warm and dry. No rash noted.   Cardiovascular: Normal heart rate noted  Respiratory: Normal respiratory effort, no problems with respiration noted  Abdomen: Soft, gravid, appropriate for gestational age.  Pain/Pressure: Present     Pelvic: Cervical exam deferred        Extremities: Normal range of motion.  Edema: Trace  Mental Status: Normal mood and affect. Normal behavior. Normal judgment and thought content.   Assessment and Plan:  Pregnancy: [id number] at [redacted]w[redacted]d 1. Supervision of high risk pregnancy, antepartum (Primary) Doing well. BP and FHR normal today.  2. [redacted] weeks gestation of pregnancy Follow up 2 weeks for ROB.  3. Diet controlled gestational diabetes mellitus (GDM) in third trimester Average fasting BS 90 and PP  average around 100 with one reading being 128 after eating a chocolate breakfast bar.  Follow up u/s 8/25.  4. History of gestational hypertension Taking Baby ASA daily. BP normal today  5. Primary narcolepsy with cataplexy Patient taking Wellbutrin  300 mg daily and Trazodone  50 mg at bedtime. Reports that she is doing well on meds. Pt requests an increase in Trazodone  dose. Reviewed literature and discussed risks vs benefits with patient. Discussed sedation risks later with newborn.  Rx for 100 mg Q hs PRN sent to pharmacy.   6. Mild episode of recurrent major depressive disorder (HCC) Stable on Zoloft  50mg  daily  7. Anxiety state Stable on Zoloft  50mg  daily   Preterm labor symptoms and general obstetric precautions including but not limited to vaginal bleeding, contractions, leaking of fluid and fetal movement were reviewed in detail with the patient. Please refer to After Visit Summary for other counseling recommendations.   Return in about 2 weeks (around 10/14/2023) for ROB.  Future Appointments  Date Time Provider Department Center  10/06/2023 11:15 AM [facility] PROVIDER 1 [facility]  10/06/2023 11:30 AM [facility] US4 [facility]    Ariel JINNY Freund, NP Student  Midwife Attestation:  I personally saw and evaluated the patient, performing the key elements of the service.  I developed and verified the management plan that is described in the resident's/student's note, and I agree with the content with my edits above. VSS, HRR&R, Resp unlabored, Legs neg.    Olam Boards, CNM 10:55 AM

## 2023-10-01 ENCOUNTER — Other Ambulatory Visit (HOSPITAL_COMMUNITY): Payer: Self-pay

## 2023-10-01 ENCOUNTER — Encounter: Payer: Self-pay | Admitting: Pharmacist

## 2023-10-01 ENCOUNTER — Other Ambulatory Visit: Payer: Self-pay

## 2023-10-02 ENCOUNTER — Encounter: Payer: Self-pay | Admitting: Pharmacist

## 2023-10-02 ENCOUNTER — Other Ambulatory Visit: Payer: Self-pay

## 2023-10-06 ENCOUNTER — Other Ambulatory Visit: Payer: Self-pay

## 2023-10-06 ENCOUNTER — Other Ambulatory Visit: Payer: Self-pay | Admitting: *Deleted

## 2023-10-06 ENCOUNTER — Ambulatory Visit (HOSPITAL_BASED_OUTPATIENT_CLINIC_OR_DEPARTMENT_OTHER)

## 2023-10-06 ENCOUNTER — Ambulatory Visit (HOSPITAL_BASED_OUTPATIENT_CLINIC_OR_DEPARTMENT_OTHER): Attending: Maternal & Fetal Medicine | Admitting: Obstetrics

## 2023-10-06 VITALS — BP 129/74 | HR 91

## 2023-10-06 DIAGNOSIS — E669 Obesity, unspecified: Secondary | ICD-10-CM

## 2023-10-06 DIAGNOSIS — O0990 Supervision of high risk pregnancy, unspecified, unspecified trimester: Secondary | ICD-10-CM

## 2023-10-06 DIAGNOSIS — Z8759 Personal history of other complications of pregnancy, childbirth and the puerperium: Secondary | ICD-10-CM

## 2023-10-06 DIAGNOSIS — O09293 Supervision of pregnancy with other poor reproductive or obstetric history, third trimester: Secondary | ICD-10-CM | POA: Insufficient documentation

## 2023-10-06 DIAGNOSIS — O99213 Obesity complicating pregnancy, third trimester: Secondary | ICD-10-CM

## 2023-10-06 DIAGNOSIS — Z3A30 30 weeks gestation of pregnancy: Secondary | ICD-10-CM | POA: Diagnosis not present

## 2023-10-06 DIAGNOSIS — O24410 Gestational diabetes mellitus in pregnancy, diet controlled: Secondary | ICD-10-CM | POA: Diagnosis not present

## 2023-10-06 DIAGNOSIS — O24913 Unspecified diabetes mellitus in pregnancy, third trimester: Secondary | ICD-10-CM

## 2023-10-06 DIAGNOSIS — O99212 Obesity complicating pregnancy, second trimester: Secondary | ICD-10-CM

## 2023-10-06 NOTE — Progress Notes (Signed)
 MFM Consult Note  Barbara [name] is currently at 30 weeks and 2 days.  Barbara [name] has been followed due to maternal obesity with a BMI of 40 and diet-controlled gestational diabetes.    Barbara [name] denies any problems since her last exam and reports that her fingerstick values have mostly been within normal limits.  On today's exam, the overall EFW of 3 pounds 15 ounces measures at the 80th percentile for her gestational age.    There was normal amniotic fluid noted with a total AFI of 16.87 cm.  Due to gestational diabetes and maternal obesity, we will start weekly fetal testing at 34 weeks.    Barbara [name] will return in 4 weeks for a BPP.  Should her fetal growth and fetal testing remain within normal limits, delivery is recommended at around 39 weeks.    The patient stated that all of her questions were answered today.  A total of 20 minutes was spent counseling and coordinating the care for this patient.  Greater than 50% of the time was spent in direct face-to-face contact.

## 2023-10-07 ENCOUNTER — Other Ambulatory Visit: Payer: Self-pay

## 2023-10-15 ENCOUNTER — Ambulatory Visit (INDEPENDENT_AMBULATORY_CARE_PROVIDER_SITE_OTHER): Admitting: Obstetrics & Gynecology

## 2023-10-15 VITALS — BP 119/81 | HR 98 | Wt 257.0 lb

## 2023-10-15 DIAGNOSIS — O0990 Supervision of high risk pregnancy, unspecified, unspecified trimester: Secondary | ICD-10-CM | POA: Diagnosis not present

## 2023-10-15 DIAGNOSIS — Z3A31 31 weeks gestation of pregnancy: Secondary | ICD-10-CM | POA: Diagnosis not present

## 2023-10-15 DIAGNOSIS — Z8759 Personal history of other complications of pregnancy, childbirth and the puerperium: Secondary | ICD-10-CM

## 2023-10-15 DIAGNOSIS — O24410 Gestational diabetes mellitus in pregnancy, diet controlled: Secondary | ICD-10-CM | POA: Diagnosis not present

## 2023-10-15 NOTE — Progress Notes (Signed)
   PRENATAL VISIT NOTE  Subjective:  Barbara [name] is a 28 y.o. [phone] at [redacted]w[redacted]d being seen today for ongoing prenatal care.  She is currently monitored for the following issues for this high-risk pregnancy and has Narcolepsy; Family history of cleft palate; GDM (gestational diabetes mellitus); History of gestational hypertension; BMI 30s; Severe recurrent major depression without psychotic features (HCC); Mild episode of recurrent major depressive disorder (HCC); Anxiety state; and Supervision of high risk pregnancy, antepartum on their problem list.  Patient reports no complaints.  Contractions: Not present. Vag. Bleeding: None.  Movement: Present. Denies leaking of fluid.   The following portions of the patient's history were reviewed and updated as appropriate: allergies, current medications, past family history, past medical history, past social history, past surgical history and problem list.   Objective:    Vitals:   10/15/23 1001  BP: 119/81  Pulse: 98  Weight: 257 lb (116.6 kg)    Fetal Status:  Fetal Heart Rate (bpm): 130   Movement: Present    General: Alert, oriented and cooperative. Patient is in no acute distress.  Skin: Skin is warm and dry. No rash noted.   Cardiovascular: Normal heart rate noted  Respiratory: Normal respiratory effort, no problems with respiration noted  Abdomen: Soft, gravid, appropriate for gestational age.  Pain/Pressure: Present     Pelvic: Cervical exam deferred        Extremities: Normal range of motion.     Mental Status: Normal mood and affect. Normal behavior. Normal judgment and thought content.   Assessment and Plan:  Pregnancy: [id number] at [redacted]w[redacted]d 1. [redacted] weeks gestation of pregnancy (Primary) [redacted]w[redacted]d  2. Diet controlled gestational diabetes mellitus (GDM) in third trimester Well controlled with diet: she did not bring her log  3. History of gestational hypertension   4. Supervision of high risk pregnancy, antepartum   Preterm  labor symptoms and general obstetric precautions including but not limited to vaginal bleeding, contractions, leaking of fluid and fetal movement were reviewed in detail with the patient. Please refer to After Visit Summary for other counseling recommendations.   Return in about 2 weeks (around 10/29/2023).  Future Appointments  Date Time Provider Department Center  11/04/2023 11:15 AM [facility] PROVIDER 1 [facility]  11/04/2023 11:30 AM [facility] US5 [facility]  11/11/2023 11:15 AM [facility] PROVIDER 1 [facility]  11/11/2023 11:30 AM [facility] US2 [facility]  11/18/2023 11:00 AM [facility] PROVIDER 1 [facility]  11/18/2023 11:30 AM [facility] US4 [facility]  11/25/2023 11:00 AM [facility] PROVIDER 1 [facility]  11/25/2023 11:30 AM [facility] US1 [facility]  12/02/2023 11:00 AM [facility] PROVIDER 1 [facility]  12/03/2023 11:30 AM [facility] US4 [facility]    Lynwood Solomons, MD

## 2023-10-29 ENCOUNTER — Encounter: Payer: Self-pay | Admitting: Obstetrics

## 2023-10-29 ENCOUNTER — Ambulatory Visit: Admitting: Obstetrics

## 2023-10-29 ENCOUNTER — Encounter: Admitting: Obstetrics

## 2023-10-29 VITALS — BP 138/89 | HR 92 | Wt 267.7 lb

## 2023-10-29 DIAGNOSIS — O24410 Gestational diabetes mellitus in pregnancy, diet controlled: Secondary | ICD-10-CM | POA: Diagnosis not present

## 2023-10-29 DIAGNOSIS — O0990 Supervision of high risk pregnancy, unspecified, unspecified trimester: Secondary | ICD-10-CM

## 2023-10-29 DIAGNOSIS — F332 Major depressive disorder, recurrent severe without psychotic features: Secondary | ICD-10-CM | POA: Diagnosis not present

## 2023-10-29 DIAGNOSIS — Z8759 Personal history of other complications of pregnancy, childbirth and the puerperium: Secondary | ICD-10-CM | POA: Diagnosis not present

## 2023-10-29 NOTE — Progress Notes (Signed)
 Pt presents for rob. Pt has no questions or concerns at this time.

## 2023-10-29 NOTE — Progress Notes (Signed)
 Subjective:  Barbara [name] is a 28 y.o. [phone] at 105w4d being seen today for ongoing prenatal care.  She is currently monitored for the following issues for this high-risk pregnancy and has Narcolepsy; Family history of cleft palate; GDM (gestational diabetes mellitus); History of gestational hypertension; BMI 30s; Severe recurrent major depression without psychotic features (HCC); Mild episode of recurrent major depressive disorder (HCC); Anxiety state; and Supervision of high risk pregnancy, antepartum on their problem list.  Patient reports heartburn.  Contractions: Irritability. Vag. Bleeding: None.  Movement: Present. Denies leaking of fluid.   The following portions of the patient's history were reviewed and updated as appropriate: allergies, current medications, past family history, past medical history, past social history, past surgical history and problem list. Problem list updated.  Objective:   Vitals:   10/29/23 1030  BP: 138/89  Pulse: 92  Weight: 267 lb 11.2 oz (121.4 kg)    Fetal Status: Fetal Heart Rate (bpm): 145   Movement: Present     General:  Alert, oriented and cooperative. Patient is in no acute distress.  Skin: Skin is warm and dry. No rash noted.   Cardiovascular: Normal heart rate noted  Respiratory: Normal respiratory effort, no problems with respiration noted  Abdomen: Soft, gravid, appropriate for gestational age. Pain/Pressure: Present     Pelvic:  Cervical exam deferred        Extremities: Normal range of motion.  Edema: Trace (Feet and hands)  Mental Status: Normal mood and affect. Normal behavior. Normal judgment and thought content.   Urinalysis:      Assessment and Plan:  Pregnancy: [id number] at [redacted]w[redacted]d  1. Supervision of high risk pregnancy, antepartum (Primary)  2. History of gestational hypertension - BP clinically stable  3. Diet controlled gestational diabetes mellitus (GDM), antepartum - good glucose control:  FBS < 100   and   2 hour PP  < 120  4. Severe recurrent major depression without psychotic features (HCC) - clinically stable    Preterm labor symptoms and general obstetric precautions including but not limited to vaginal bleeding, contractions, leaking of fluid and fetal movement were reviewed in detail with the patient. Please refer to After Visit Summary for other counseling recommendations.   Return in about 2 weeks (around 11/12/2023) for [facility].   Rudy Carlin LABOR, MD 10/29/2023

## 2023-10-31 ENCOUNTER — Other Ambulatory Visit: Payer: Self-pay

## 2023-10-31 ENCOUNTER — Encounter (HOSPITAL_COMMUNITY): Payer: Self-pay | Admitting: Obstetrics and Gynecology

## 2023-10-31 ENCOUNTER — Inpatient Hospital Stay (HOSPITAL_COMMUNITY)
Admission: AD | Admit: 2023-10-31 | Discharge: 2023-11-01 | Disposition: A | Discharge status: Home / Self Care | Attending: Family Medicine | Admitting: Family Medicine

## 2023-10-31 DIAGNOSIS — O24419 Gestational diabetes mellitus in pregnancy, unspecified control: Secondary | ICD-10-CM | POA: Diagnosis not present

## 2023-10-31 DIAGNOSIS — O23592 Infection of other part of genital tract in pregnancy, second trimester: Secondary | ICD-10-CM | POA: Diagnosis not present

## 2023-10-31 DIAGNOSIS — O133 Gestational [pregnancy-induced] hypertension without significant proteinuria, third trimester: Secondary | ICD-10-CM | POA: Diagnosis not present

## 2023-10-31 DIAGNOSIS — Z3A33 33 weeks gestation of pregnancy: Secondary | ICD-10-CM

## 2023-10-31 DIAGNOSIS — B9689 Other specified bacterial agents as the cause of diseases classified elsewhere: Secondary | ICD-10-CM | POA: Diagnosis not present

## 2023-10-31 DIAGNOSIS — O4703 False labor before 37 completed weeks of gestation, third trimester: Secondary | ICD-10-CM | POA: Diagnosis present

## 2023-10-31 DIAGNOSIS — Z79899 Other long term (current) drug therapy: Secondary | ICD-10-CM | POA: Diagnosis not present

## 2023-10-31 DIAGNOSIS — O479 False labor, unspecified: Secondary | ICD-10-CM | POA: Diagnosis not present

## 2023-10-31 DIAGNOSIS — Z3A34 34 weeks gestation of pregnancy: Secondary | ICD-10-CM | POA: Insufficient documentation

## 2023-10-31 DIAGNOSIS — O0990 Supervision of high risk pregnancy, unspecified, unspecified trimester: Secondary | ICD-10-CM

## 2023-10-31 DIAGNOSIS — O26893 Other specified pregnancy related conditions, third trimester: Secondary | ICD-10-CM | POA: Diagnosis not present

## 2023-10-31 DIAGNOSIS — R519 Headache, unspecified: Secondary | ICD-10-CM | POA: Insufficient documentation

## 2023-10-31 DIAGNOSIS — O0993 Supervision of high risk pregnancy, unspecified, third trimester: Secondary | ICD-10-CM | POA: Insufficient documentation

## 2023-10-31 LAB — COMPREHENSIVE METABOLIC PANEL WITH GFR
ALT: 11 U/L (ref 0–44)
AST: 20 U/L (ref 15–41)
Albumin: 2.8 g/dL — ABNORMAL LOW (ref 3.5–5.0)
Alkaline Phosphatase: 116 U/L (ref 38–126)
Anion gap: 15 (ref 5–15)
BUN: 6 mg/dL (ref 6–20)
CO2: 18 mmol/L — ABNORMAL LOW (ref 22–32)
Calcium: 9.4 mg/dL (ref 8.9–10.3)
Chloride: 103 mmol/L (ref 98–111)
Creatinine, Ser: 0.56 mg/dL (ref 0.44–1.00)
GFR, Estimated: >60 mL/min (ref >60)
Glucose, Bld: 90 mg/dL (ref 70–99)
Potassium: 3.5 mmol/L (ref 3.5–5.1)
Sodium: 136 mmol/L (ref 135–145)
Total Bilirubin: 0.7 mg/dL (ref 0.0–1.2)
Total Protein: 6.8 g/dL (ref 6.5–8.1)

## 2023-10-31 LAB — URINALYSIS, ROUTINE W REFLEX MICROSCOPIC
Bilirubin Urine: NEGATIVE
Glucose, UA: NEGATIVE mg/dL
Hgb urine dipstick: NEGATIVE
Ketones, ur: NEGATIVE mg/dL
Nitrite: NEGATIVE
Protein, ur: 100 mg/dL — AB
Specific Gravity, Urine: 1.029 (ref 1.005–1.030)
pH: 5 (ref 5.0–8.0)

## 2023-10-31 LAB — CBC
HCT: 36.1 % (ref 36.0–46.0)
Hemoglobin: 12.2 g/dL (ref 12.0–15.0)
MCH: 27.6 pg (ref 26.0–34.0)
MCHC: 33.8 g/dL (ref 30.0–36.0)
MCV: 81.7 fL (ref 80.0–100.0)
Platelets: 241 K/uL (ref 150–400)
RBC: 4.42 MIL/uL (ref 3.87–5.11)
RDW: 14.4 % (ref 11.5–15.5)
WBC: 11.3 K/uL — ABNORMAL HIGH (ref 4.0–10.5)
nRBC: 0 % (ref 0.0–0.2)

## 2023-10-31 LAB — WET PREP, GENITAL
Sperm: NONE SEEN
Trich, Wet Prep: NONE SEEN
WBC, Wet Prep HPF POC: >=10 — AB (ref <10)
Yeast Wet Prep HPF POC: NONE SEEN

## 2023-10-31 LAB — PROTEIN / CREATININE RATIO, URINE
Creatinine, Urine: 356 mg/dL
Protein Creatinine Ratio: 0.11 mg/mg{creat} (ref 0.00–0.15)
Total Protein, Urine: 39 mg/dL

## 2023-10-31 MED ORDER — LACTATED RINGERS IV BOLUS
1000.0000 mL | Freq: Once | INTRAVENOUS | Status: AC
  Administered 2023-10-31: 1000 mL via INTRAVENOUS

## 2023-10-31 MED ORDER — MORPHINE SULFATE (PF) 4 MG/ML IV SOLN
4.0000 mg | Freq: Once | INTRAVENOUS | Status: AC
  Administered 2023-10-31: 4 mg via INTRAVENOUS
  Filled 2023-10-31: qty 1

## 2023-10-31 MED ORDER — CYCLOBENZAPRINE HCL 5 MG PO TABS
5.0000 mg | ORAL_TABLET | ORAL | Status: AC
Start: 2023-10-31 — End: 2023-10-31
  Administered 2023-10-31: 5 mg via ORAL
  Filled 2023-10-31: qty 1

## 2023-10-31 MED ORDER — ONDANSETRON HCL 4 MG/2ML IJ SOLN
4.0000 mg | Freq: Once | INTRAMUSCULAR | Status: AC
  Administered 2023-10-31: 4 mg via INTRAVENOUS
  Filled 2023-10-31: qty 2

## 2023-10-31 MED ORDER — NIFEDIPINE ER OSMOTIC RELEASE 30 MG PO TB24
30.0000 mg | ORAL_TABLET | Freq: Once | ORAL | Status: AC
  Administered 2023-11-01: 30 mg via ORAL
  Filled 2023-10-31: qty 1

## 2023-10-31 NOTE — MAU Provider Note (Incomplete Revision)
 History     CSN: [id number]  Arrival date and time: 10/31/23 1727 First Provider Initiated Contact with Patient 10/31/2023  6:12 PM  Chief Complaint  Patient presents with   Contractions    HPI RAUL WINTERHALTER is a 28 y.o. H5E7987 at [redacted]w[redacted]d, 12/13/2023, by Ultrasound, who presents to the Maternity Assessment Unit for contractions for about 8 hours. They began as pressure in her pelvis and has moved up to feel like squeezing over her entire belly. The squeezing is frequent and she has to stop what she is doing when they start. She endorses white vaginal discharge.  ROS (+) ctx?, FM,  (-) VB, LOF,   Medications Prior to Admission  Medication Sig Dispense Refill Last Dose/Taking   aspirin  EC 81 MG tablet Take 1 tablet (81 mg total) by mouth daily. Swallow whole. 30 tablet 4 10/31/2023   buPROPion  (WELLBUTRIN  XL) 300 MG 24 hr tablet Take 1 tablet (300 mg total) by mouth daily. 30 tablet 5 10/31/2023   famotidine  (PEPCID ) 20 MG tablet Take 1 tablet (20 mg total) by mouth 2 (two) times daily. 60 tablet 5 10/31/2023   Prenatal Vit-Fe Fumarate-FA (MULTIVITAMIN-PRENATAL) 27-0.8 MG TABS tablet Take 1 tablet by mouth daily.   10/31/2023   sertraline  (ZOLOFT ) 50 MG tablet Take 1 tablet (50 mg total) by mouth daily. 90 tablet 3 10/31/2023   traZODone  (DESYREL ) 50 MG tablet Take 2 tablets (100 mg total) by mouth at bedtime. 30 tablet 1 10/31/2023   Accu-Chek Softclix Lancets lancets Check blood sugars 4 times daily; once when waking up, and 2 hours after each meal. 100 each 12    Blood Glucose Monitoring Suppl (ACCU-CHEK GUIDE) w/Device KIT 1 Device by Does not apply route 4 (four) times daily. 1 kit 0    Blood Pressure Monitoring (BLOOD PRESSURE KIT) DEVI 1 Device by Does not apply route once a week. (Patient not taking: Reported on 10/29/2023) 1 each 0    glucose blood test strip Check blood sugars 4 times daily; once when waking up, and 2 hours after each meal. 100 each 12    hydrOXYzine  (ATARAX ) 25 MG  tablet Take 1 tablet (25 mg total) by mouth 3 (three) times daily as needed. 30 tablet 0 More than a month   ondansetron  (ZOFRAN -ODT) 4 MG disintegrating tablet Take 1 tablet (4 mg total) by mouth every 8 (eight) hours as needed for nausea or vomiting. 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 Can eat can corn. Cannot shuck and touch skin and silk.   Percocet [Oxycodone -Acetaminophen ] Anaphylaxis and Other (See Comments)    States feels like chest is tightening up.   Other Itching and Nausea And Vomiting    Sherbert    ROS reviewed and pertinent positives  and negatives as documented in HPI.    Physical Exam  BP (!) 143/94   Pulse 97   Temp 97.8 F (36.6 C) (Oral)   Resp 18   Ht 5' 5 (1.651 m)   Wt 119.6 kg   LMP  (LMP Unknown)   SpO2 100%   BMI 43.87 kg/m   Gen: alert, no acute distress CV: tachycardic Resp: nonlabored Abd: gravid, tender on R lumbar and RUQ overlying baby  Cervical Exam Closed, posterior, cervix is soft  FHT Baseline: 140 bpm Variability: Good {> 6 bpm) Accelerations: Reactive Decelerations: Absent Uterine activity: Frequency: Every 3 minutes  Labs A/Positive/-- (04/23 1032)   Results for orders placed or performed during the hospital encounter of 10/31/23 (from the past 24 hours)  Urinalysis, Routine w reflex microscopic -Urine, Clean Catch      Status: Abnormal   Collection Time: 10/31/23  6:16 PM  Result Value Ref Range   Color, Urine AMBER (A) YELLOW   APPearance CLOUDY (A) CLEAR   Specific Gravity, Urine 1.029 1.005 - 1.030   pH 5.0 5.0 - 8.0   Glucose, UA NEGATIVE NEGATIVE mg/dL   Hgb urine dipstick NEGATIVE NEGATIVE   Bilirubin Urine NEGATIVE NEGATIVE   Ketones, ur NEGATIVE NEGATIVE mg/dL   Protein, ur 899 (A) NEGATIVE mg/dL   Nitrite NEGATIVE NEGATIVE   Leukocytes,Ua MODERATE (A) NEGATIVE   RBC / HPF 11-20 0 - 5 RBC/hpf   WBC, UA 21-50 0 - 5 WBC/hpf   Bacteria, UA RARE (A) NONE SEEN   Squamous Epithelial / HPF 21-50 0 - 5 /HPF   Mucus PRESENT    Ca Oxalate Crys, UA PRESENT   Protein / creatinine ratio, urine     Status: None   Collection Time: 10/31/23  6:16 PM  Result Value Ref Range   Creatinine, Urine 356 mg/dL   Total Protein, Urine 39 mg/dL   Protein Creatinine Ratio 0.11 0.00 - 0.15 mg/mg[Cre]  Wet prep, genital     Status: Abnormal   Collection Time: 10/31/23  7:49 PM   Specimen: PATH Cytology Cervicovaginal Ancillary Only  Result Value Ref Range   Yeast Wet Prep HPF POC NONE SEEN NONE SEEN   Trich, Wet Prep NONE SEEN NONE SEEN   Clue Cells Wet Prep HPF POC PRESENT (A) NONE SEEN   WBC, Wet Prep HPF POC >=10 (A) <10   Sperm NONE SEEN   CBC     Status: Abnormal   Collection Time: 10/31/23  7:49 PM  Result Value Ref Range   WBC 11.3 (H) 4.0 - 10.5 K/uL   RBC 4.42 3.87 - 5.11 MIL/uL   Hemoglobin 12.2 12.0 - 15.0 g/dL   HCT 63.8 63.9 - 53.9 %   MCV 81.7 80.0 - 100.0 fL   MCH 27.6 26.0 - 34.0 pg   MCHC 33.8 30.0 - 36.0 g/dL   RDW 85.5 88.4 - 84.4 %   Platelets 241 150 - 400 K/uL   nRBC 0.0 0.0 - 0.2 %  Comprehensive metabolic panel with GFR     Status: Abnormal   Collection Time: 10/31/23  7:49 PM  Result Value Ref Range   Sodium 136 135 - 145 mmol/L   Potassium 3.5 3.5 - 5.1 mmol/L   Chloride 103 98 - 111 mmol/L   CO2 18 (L) 22 - 32 mmol/L   Glucose, Bld 90 70 - 99 mg/dL   BUN 6 6 - 20 mg/dL    Creatinine, Ser 9.43 0.44 - 1.00 mg/dL  Calcium 9.4 8.9 - 10.3 mg/dL   Total Protein 6.8 6.5 - 8.1 g/dL   Albumin 2.8 (L) 3.5 - 5.0 g/dL   AST 20 15 - 41 U/L   ALT 11 0 - 44 U/L   Alkaline Phosphatase 116 38 - 126 U/L   Total Bilirubin 0.7 0.0 - 1.2 mg/dL   GFR, Estimated >39 >39 mL/min   Anion gap 15 5 - 15    Imaging No results found.   Assessment and Plan  MDM MCKENZE SLONE is a 28 y.o. H5E7987 at [redacted]w[redacted]d, 12/13/2023, by Ultrasound, who presents to the MAU for pelvic and abdominal pain. Ddx: BH ctx, vaginitis, UTI, dehydration, latent labor.   1. Supervision of high risk pregnancy, antepartum (Primary)  2. False labor  3. Bacterial vaginosis in pregnancy  4. [redacted] weeks gestation of pregnancy   Dispo: This patient was signed out to my colleague pending recheck after Flexeril  and repeat cervical exam.    Barabara Maier, DO FMOB Fellow, Faculty Practice [facility], Center for [facility]  Reassessment (10:28 PM) -Results as above. -Patient reports some relief with flexeril , but now experiencing nausea/vomiting. -Discussed additional fluids and antiemetic, patient agreeable.   -Patient also requesting additional medication.

## 2023-10-31 NOTE — Discharge Instructions (Signed)
 Please return to the MAU (Maternity Assessment Unit) if you experience vaginal bleeding,leaking/gush of fluid like your water broke, notice decreased movement from your baby after doing kick counts, or contractions the are becoming more intense or more frequent.   Fetal Movement Counts When you're pregnant, you might start feeling your baby move around the middle of your pregnancy. At first, these movements might feel like flutters, rolls, or swishes. As your baby grows, you might feel more kicks and jabs. Around week 28 of your pregnancy, your health care team may ask you to count how often your baby moves. This is important for all pregnancies, but especially for high-risk ones. Counting movements can help lessen the risk of stillbirth.  What is a fetal movement count? A fetal movement count is the number of times that you feel your baby move during a certain amount of time. This may also be called a kick count. There are many ways to do a kick count. Ask your team what is best for you. Pay attention to when your baby is most active. You may notice your baby's sleep and wake cycles. You may also notice things that make your baby move more. When you do a kick count, try to do it: When your baby is normally most active. At the same time each day.  How do I count fetal movements? Find a quiet, comfortable area. Sit or lie down. Write down the date, the start time, and the number of movements you feel. Count kicks, flutters, swishes, rolls, and jabs. Usually, you will feel at least 10 movements within 2 hours. Stop counting after you have felt 10 movements or if you have been counting for 2 hours. Write down the stop time.  Contact a health care provider if:  You don't feel 10 movements in 2 hours. Your baby isn't moving as it usually does. Your baby isn't moving at all. If you're not able to reach your provider, go to an emergency room. This information is not intended to replace advice given  to you by your health care provider. Make sure you discuss any questions you have with your health care provider.  Document Revised: 02/21/2023 Document Reviewed: 02/13/2022 Elsevier Patient Education  2025 [employer].  Signs and Symptoms of Labor Labor is the body's natural process of moving the baby and the placenta out of the uterus. The process of labor usually starts when the baby is full-term, 37 weeks and 0 days or more.   As your body prepares for labor and the birth of your baby, you may notice the following symptoms in the weeks and days before true labor starts: Your baby dropping lower into your pelvis to get into position for birth (lightening). When this happens, you may feel more pressure on your bladder and pelvic bone and less pressure on your ribs. This may make it easier to breathe. It may also cause you to need to urinate more often and have problems with bowel movements. Having practice contractions, also called Braxton Hicks contractions or false labor. These occur at irregular (unevenly spaced) intervals that are more than 10 minutes apart. False labor contractions are common after exercise or sexual activity. They will stop if you change position, rest, or drink fluids. These contractions are usually mild and do not get stronger over time. They may feel like: A backache or back pain. Mild cramps, similar to menstrual cramps. Tightening or pressure in your abdomen. Passing a small amount of thick, bloody mucus from  your vagina. This is called normal bloody show or losing your mucus plug. This may happen more than a week before labor begins, or right before labor begins, as the opening of the cervix starts to widen (dilate). For some women, the entire mucus plug passes at once. For others, pieces of the mucus plug may gradually pass over several days.  Signs and symptoms that labor has begun Signs that you are in labor may include: Having contractions that come at  regular (evenly spaced) intervals and increase in intensity. This may feel like more intense tightening or pressure in your abdomen that moves to your back. Feeling pressure in the vaginal area. Your water breaking (called rupture of membranes). This is when the sac of fluid that surrounds your baby breaks. Fluid leaking from your vagina may be clear or blood-tinged. Labor usually starts within 24 hours of your water breaking, but it may take longer to begin. Some people may feel a sudden gush of fluid; others may notice repeatedly damp underwear.  Go to the hospital when  Your water breaks. Your labor has started: painful, regular contractions that are 5 minutes apart or less. You have a fever. You have bright red blood coming from your vagina. You do not feel your baby moving. You have a severe headache with or without vision problems. You have chest pain or shortness of breath. These symptoms may represent a serious problem that is an emergency. Do not wait to see if the symptoms will go away. Get medical help right away. Call your local emergency services (911 in the [place] Do not drive yourself to the hospital.  Summary Labor is your body's natural process of moving your baby and the placenta out of your uterus. The process of labor usually starts when your baby is full-term When labor starts, or if your water breaks, call your health care provider or nurse care line. Based on your situation, they will determine when you should go in for an exam. This information is not intended to replace advice given to you by your health care provider. Make sure you discuss any questions you have with your health care provider.  Document Revised: 06/13/2020 Document Reviewed: 06/13/2020-- adapted Elsevier Patient Education  2024 [employer].

## 2023-10-31 NOTE — MAU Provider Note (Addendum)
 History     CSN: [id number]  Arrival date and time: 10/31/23 1727 First Provider Initiated Contact with Patient 10/31/2023  6:12 PM  Chief Complaint  Patient presents with   Contractions    HPI Barbara [name] is a 28 y.o. [id number] at [redacted]w[redacted]d, 12/13/2023, by Ultrasound, who presents to the Maternity Assessment Unit for contractions for about 8 hours. They began as pressure in her pelvis and has moved up to feel like squeezing over her entire belly. The squeezing is frequent and she has to stop what she is doing when they start. She endorses white vaginal discharge.  ROS (+) ctx?, FM,  (-) VB, LOF,   Medications Prior to Admission  Medication Sig Dispense Refill Last Dose/Taking   aspirin  EC 81 MG tablet Take 1 tablet (81 mg total) by mouth daily. Swallow whole. 30 tablet 4 10/31/2023   buPROPion  (WELLBUTRIN  XL) 300 MG 24 hr tablet Take 1 tablet (300 mg total) by mouth daily. 30 tablet 5 10/31/2023   famotidine  (PEPCID ) 20 MG tablet Take 1 tablet (20 mg total) by mouth 2 (two) times daily. 60 tablet 5 10/31/2023   Prenatal Vit-Fe Fumarate-FA (MULTIVITAMIN-PRENATAL) 27-0.8 MG TABS tablet Take 1 tablet by mouth daily.   10/31/2023   sertraline  (ZOLOFT ) 50 MG tablet Take 1 tablet (50 mg total) by mouth daily. 90 tablet 3 10/31/2023   traZODone  (DESYREL ) 50 MG tablet Take 2 tablets (100 mg total) by mouth at bedtime. 30 tablet 1 10/31/2023   Accu-Chek Softclix Lancets lancets Check blood sugars 4 times daily; once when waking up, and 2 hours after each meal. 100 each 12    Blood Glucose Monitoring Suppl (ACCU-CHEK GUIDE) w/Device KIT 1 Device by Does not apply route 4 (four) times daily. 1 kit 0    Blood Pressure Monitoring (BLOOD PRESSURE KIT) DEVI 1 Device by Does not apply route once a week. (Patient not taking: Reported on 10/29/2023) 1 each 0    glucose blood test strip Check blood sugars 4 times daily; once when waking up, and 2 hours after each meal. 100 each 12    hydrOXYzine  (ATARAX ) 25 MG  tablet Take 1 tablet (25 mg total) by mouth 3 (three) times daily as needed. 30 tablet 0 More than a month   ondansetron  (ZOFRAN -ODT) 4 MG disintegrating tablet Take 1 tablet (4 mg total) by mouth every 8 (eight) hours as needed for nausea or vomiting. 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 Can eat can corn. Cannot shuck and touch skin and silk.   Percocet [Oxycodone -Acetaminophen ] Anaphylaxis and Other (See Comments)    States feels like chest is tightening up.   Other Itching and Nausea And Vomiting    Sherbert    ROS reviewed and pertinent positives  and negatives as documented in HPI.    Physical Exam  BP (!) 143/94   Pulse 97   Temp 97.8 F (36.6 C) (Oral)   Resp 18   Ht 5' 5 (1.651 m)   Wt 119.6 kg   LMP  (LMP Unknown)   SpO2 100%   BMI 43.87 kg/m   Gen: alert, no acute distress CV: tachycardic Resp: nonlabored Abd: gravid, tender on R lumbar and RUQ overlying baby  Cervical Exam Closed, posterior, cervix is soft  FHT Baseline: 140 bpm Variability: Good {> 6 bpm) Accelerations: Reactive Decelerations: Absent Uterine activity: Frequency: Every 3 minutes  Labs A/Positive/-- (04/23 1032)   Results for orders placed or performed during the hospital encounter of 10/31/23 (from the past 24 hours)  Urinalysis, Routine w reflex microscopic -Urine, Clean Catch      Status: Abnormal   Collection Time: 10/31/23  6:16 PM  Result Value Ref Range   Color, Urine AMBER (A) YELLOW   APPearance CLOUDY (A) CLEAR   Specific Gravity, Urine 1.029 1.005 - 1.030   pH 5.0 5.0 - 8.0   Glucose, UA NEGATIVE NEGATIVE mg/dL   Hgb urine dipstick NEGATIVE NEGATIVE   Bilirubin Urine NEGATIVE NEGATIVE   Ketones, ur NEGATIVE NEGATIVE mg/dL   Protein, ur 899 (A) NEGATIVE mg/dL   Nitrite NEGATIVE NEGATIVE   Leukocytes,Ua MODERATE (A) NEGATIVE   RBC / HPF 11-20 0 - 5 RBC/hpf   WBC, UA 21-50 0 - 5 WBC/hpf   Bacteria, UA RARE (A) NONE SEEN   Squamous Epithelial / HPF 21-50 0 - 5 /HPF   Mucus PRESENT    Ca Oxalate Crys, UA PRESENT   Protein / creatinine ratio, urine     Status: None   Collection Time: 10/31/23  6:16 PM  Result Value Ref Range   Creatinine, Urine 356 mg/dL   Total Protein, Urine 39 mg/dL   Protein Creatinine Ratio 0.11 0.00 - 0.15 mg/mg[Cre]  Wet prep, genital     Status: Abnormal   Collection Time: 10/31/23  7:49 PM   Specimen: PATH Cytology Cervicovaginal Ancillary Only  Result Value Ref Range   Yeast Wet Prep HPF POC NONE SEEN NONE SEEN   Trich, Wet Prep NONE SEEN NONE SEEN   Clue Cells Wet Prep HPF POC PRESENT (A) NONE SEEN   WBC, Wet Prep HPF POC >=10 (A) <10   Sperm NONE SEEN   CBC     Status: Abnormal   Collection Time: 10/31/23  7:49 PM  Result Value Ref Range   WBC 11.3 (H) 4.0 - 10.5 K/uL   RBC 4.42 3.87 - 5.11 MIL/uL   Hemoglobin 12.2 12.0 - 15.0 g/dL   HCT 63.8 63.9 - 53.9 %   MCV 81.7 80.0 - 100.0 fL   MCH 27.6 26.0 - 34.0 pg   MCHC 33.8 30.0 - 36.0 g/dL   RDW 85.5 88.4 - 84.4 %   Platelets 241 150 - 400 K/uL   nRBC 0.0 0.0 - 0.2 %  Comprehensive metabolic panel with GFR     Status: Abnormal   Collection Time: 10/31/23  7:49 PM  Result Value Ref Range   Sodium 136 135 - 145 mmol/L   Potassium 3.5 3.5 - 5.1 mmol/L   Chloride 103 98 - 111 mmol/L   CO2 18 (L) 22 - 32 mmol/L   Glucose, Bld 90 70 - 99 mg/dL   BUN 6 6 - 20 mg/dL    Creatinine, Ser 9.43 0.44 - 1.00 mg/dL  Calcium 9.4 8.9 - 10.3 mg/dL   Total Protein 6.8 6.5 - 8.1 g/dL   Albumin 2.8 (L) 3.5 - 5.0 g/dL   AST 20 15 - 41 U/L   ALT 11 0 - 44 U/L   Alkaline Phosphatase 116 38 - 126 U/L   Total Bilirubin 0.7 0.0 - 1.2 mg/dL   GFR, Estimated >39 >39 mL/min   Anion gap 15 5 - 15    Imaging No results found.   Assessment and Plan  MDM JERRIS KELTZ is a 28 y.o. H5E7987 at [redacted]w[redacted]d, 12/13/2023, by Ultrasound, who presents to the MAU for pelvic and abdominal pain. Ddx: BH ctx, vaginitis, UTI, dehydration, latent labor.   1. Supervision of high risk pregnancy, antepartum (Primary)  2. False labor  3. Bacterial vaginosis in pregnancy  4. [redacted] weeks gestation of pregnancy   Dispo: This patient was signed out to my colleague pending recheck after Flexeril  and repeat cervical exam.    Barabara Maier, DO FMOB Fellow, Faculty Practice [facility], Center for [facility]  Reassessment (10:28 PM) -Results as above. -Dr. IVAR Birk consulted and informed of patient status, evaluation, interventions, and results. Advised: *Agreeable with procardia  30mg  daily -Patient reports some relief with flexeril , but now experiencing nausea/vomiting. -Discussed additional fluids and antiemetic, patient agreeable.   -Patient also requesting additional pain medication. Will give IV morphine . -Discussed GHTN diagnosis considering elevated blood pressures. Reviewed daily dosing of procardia  and patient agreeable. -Give initial dose now. -Plan to have nurse reassess and if improved with discharge to home.  Reassessment (12:12 AM) -Nurse reports patient reporting improvement. -Rx sent.  -Nurse to give precautions. -Message sent to office to schedule for appt next week.  -Discharged to home in stable condition.  Harlene LITTIE Duncans MSN, CNM Advanced Practice Provider, Center for [employer]

## 2023-10-31 NOTE — MAU Note (Signed)
 Barbara [name] is a 28 y.o. at [redacted]w[redacted]d here in MAU reporting: she's been having Braxton Hicks/ctxs since 1000 this morning.  Reports pain was intermittent but is constant.  Denies VB or LOF, has a white discharge.  Reports +FM.  LMP: NA Onset of complaint: today @ 1000 Pain score: 8 Vitals:   10/31/23 1813  BP: (!) 141/88  Pulse: (!) 110  Resp: 18  Temp: 97.8 F (36.6 C)  SpO2: 100%     FHT: 135 bpm  Lab orders placed from triage: UA

## 2023-11-01 ENCOUNTER — Other Ambulatory Visit: Payer: Self-pay

## 2023-11-01 ENCOUNTER — Encounter (HOSPITAL_COMMUNITY): Payer: Self-pay | Admitting: Obstetrics and Gynecology

## 2023-11-01 ENCOUNTER — Other Ambulatory Visit (HOSPITAL_COMMUNITY): Payer: Self-pay

## 2023-11-01 ENCOUNTER — Inpatient Hospital Stay (EMERGENCY_DEPARTMENT_HOSPITAL)
Admission: AD | Admit: 2023-11-01 | Discharge: 2023-11-01 | Disposition: A | Discharge status: Home / Self Care | Source: Home / Self Care | Attending: Obstetrics and Gynecology | Admitting: Obstetrics and Gynecology

## 2023-11-01 DIAGNOSIS — O26893 Other specified pregnancy related conditions, third trimester: Secondary | ICD-10-CM | POA: Insufficient documentation

## 2023-11-01 DIAGNOSIS — Z79899 Other long term (current) drug therapy: Secondary | ICD-10-CM | POA: Insufficient documentation

## 2023-11-01 DIAGNOSIS — Z3A34 34 weeks gestation of pregnancy: Secondary | ICD-10-CM | POA: Insufficient documentation

## 2023-11-01 DIAGNOSIS — O133 Gestational [pregnancy-induced] hypertension without significant proteinuria, third trimester: Secondary | ICD-10-CM | POA: Insufficient documentation

## 2023-11-01 DIAGNOSIS — O24419 Gestational diabetes mellitus in pregnancy, unspecified control: Secondary | ICD-10-CM | POA: Insufficient documentation

## 2023-11-01 DIAGNOSIS — O4703 False labor before 37 completed weeks of gestation, third trimester: Secondary | ICD-10-CM | POA: Diagnosis not present

## 2023-11-01 DIAGNOSIS — R519 Headache, unspecified: Secondary | ICD-10-CM

## 2023-11-01 LAB — COMPREHENSIVE METABOLIC PANEL WITH GFR
ALT: 12 U/L (ref 0–44)
AST: 18 U/L (ref 15–41)
Albumin: 2.6 g/dL — ABNORMAL LOW (ref 3.5–5.0)
Alkaline Phosphatase: 101 U/L (ref 38–126)
Anion gap: 11 (ref 5–15)
BUN: 5 mg/dL — ABNORMAL LOW (ref 6–20)
CO2: 20 mmol/L — ABNORMAL LOW (ref 22–32)
Calcium: 9.4 mg/dL (ref 8.9–10.3)
Chloride: 106 mmol/L (ref 98–111)
Creatinine, Ser: 0.57 mg/dL (ref 0.44–1.00)
GFR, Estimated: >60 mL/min (ref >60)
Glucose, Bld: 95 mg/dL (ref 70–99)
Potassium: 3.5 mmol/L (ref 3.5–5.1)
Sodium: 137 mmol/L (ref 135–145)
Total Bilirubin: 0.4 mg/dL (ref 0.0–1.2)
Total Protein: 6.3 g/dL — ABNORMAL LOW (ref 6.5–8.1)

## 2023-11-01 LAB — CBC
HCT: 32.9 % — ABNORMAL LOW (ref 36.0–46.0)
Hemoglobin: 11.2 g/dL — ABNORMAL LOW (ref 12.0–15.0)
MCH: 27.7 pg (ref 26.0–34.0)
MCHC: 34 g/dL (ref 30.0–36.0)
MCV: 81.2 fL (ref 80.0–100.0)
Platelets: 219 K/uL (ref 150–400)
RBC: 4.05 MIL/uL (ref 3.87–5.11)
RDW: 13.9 % (ref 11.5–15.5)
WBC: 9.9 K/uL (ref 4.0–10.5)
nRBC: 0 % (ref 0.0–0.2)

## 2023-11-01 LAB — PROTEIN / CREATININE RATIO, URINE
Creatinine, Urine: 57 mg/dL
Total Protein, Urine: <6 mg/dL

## 2023-11-01 LAB — CULTURE, OB URINE

## 2023-11-01 MED ORDER — LABETALOL HCL 200 MG PO TABS: 200.0000 mg | ORAL_TABLET | Freq: Two times a day (BID) | ORAL | 3 refills | Status: DC

## 2023-11-01 MED ORDER — ACETAMINOPHEN-CAFFEINE 500-65 MG PO TABS
2.0000 | ORAL_TABLET | Freq: Once | ORAL | Status: AC
  Administered 2023-11-01: 2 via ORAL
  Filled 2023-11-01: qty 2

## 2023-11-01 MED ORDER — CYCLOBENZAPRINE HCL 5 MG PO TABS
10.0000 mg | ORAL_TABLET | Freq: Once | ORAL | Status: AC
  Administered 2023-11-01: 10 mg via ORAL
  Filled 2023-11-01: qty 2

## 2023-11-01 MED ORDER — METRONIDAZOLE 0.75 % VA GEL
1.0000 | Freq: Every day | VAGINAL | 1 refills | Status: DC
  Filled 2023-11-01: qty 70, 7d supply, fill #0

## 2023-11-01 MED ORDER — METOCLOPRAMIDE HCL 10 MG PO TABS
10.0000 mg | ORAL_TABLET | Freq: Once | ORAL | Status: AC
  Administered 2023-11-01: 10 mg via ORAL
  Filled 2023-11-01: qty 1

## 2023-11-01 MED ORDER — LACTATED RINGERS IV BOLUS
1000.0000 mL | Freq: Once | INTRAVENOUS | Status: AC
  Administered 2023-11-01: 1000 mL via INTRAVENOUS

## 2023-11-01 MED ORDER — NIFEDIPINE ER OSMOTIC RELEASE 30 MG PO TB24
30.0000 mg | ORAL_TABLET | Freq: Every day | ORAL | 2 refills | Status: DC
Start: 2023-11-01 — End: 2023-11-01
  Filled 2023-11-01: qty 30, 30d supply, fill #0

## 2023-11-01 NOTE — MAU Provider Note (Signed)
 Chief Complaint:  Hypertension and Headache   HPI   None     Barbara [name] is a 28 y.o. H5E7987 at [redacted]w[redacted]d who presents to maternity admissions for elevated blood pressures. She reports a home BP reading of 157/93 just before coming to the MAU today. She was seen in the MAU yesterday and diagnosed with gHTN, started nifedipine  30 mg daily. She took her first dose at 1130 today. She endorses a headache that started at 0900. Describes headache as 7/10 pounding pain behind her eyes, no relief after Tylenol  at 0930. Denies changes in vision, abdominal pain. Endorses good fetal movement.  Pregnancy Course: Receives care at [facility] . Prenatal records reviewed. Pregnancy complicated by gDM, gHTN, narcolepsy, BMI >30.  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, also been evaluated by neurology   Sleep disturbances 04/26/2021   UTI (urinary tract infection)    OB History  Gravida Para Term Preterm AB Living  4 2 2  0 1 2  SAB IAB Ectopic Multiple Live Births  1 0 0 0 2    # Outcome Date GA Lbr Len/2nd Weight Sex Type Anes PTL Lv  4 Current           3 SAB 12/2021     SAB     2 Term 05/02/17 [redacted]w[redacted]d 03:30 / 00:20 3975 g M Vag-Spont EPI  LIV  1 Term 02/24/13 [redacted]w[redacted]d 22:29 / 00:19 3810 g M Vag-Spont EPI  LIV   Past Surgical History:  Procedure Laterality Date   NO PAST SURGERIES     Family History  Problem Relation Age of Onset   Depression Mother    Schizophrenia Father    Healthy Sister    Healthy Brother    Schizophrenia Maternal Uncle    Hypertension Maternal Grandmother    Depression Maternal Grandmother    Asthma Maternal Grandmother    Diabetes Maternal Grandmother    Heart disease Maternal  Grandmother    Hearing loss Maternal Grandfather    COPD Maternal Grandfather    Social History   Tobacco Use   Smoking status: Never   Smokeless tobacco: Never  Vaping Use   Vaping status: Never Used  Substance Use Topics   Alcohol use: No    Alcohol/week: 0.0 standard drinks of alcohol   Drug use: No   Allergies  Allergen Reactions   Corn Fiber Anaphylaxis and Swelling    Pt states that it is the silk that she is allergic to. Can eat can corn. Cannot shuck and touch skin and silk.   Percocet [Oxycodone -Acetaminophen ] Anaphylaxis and Other (See Comments)    States feels like chest is tightening up.   Other Itching and Nausea And Vomiting    Sherbert   Medications Prior to Admission  Medication Sig Dispense Refill Last Dose/Taking   Accu-Chek Softclix Lancets lancets Check blood sugars 4 times daily; once when waking up, and 2 hours after each meal. 100 each 12 Past Week   aspirin  EC 81 MG tablet Take 1 tablet (81 mg total) by mouth daily. Swallow whole. 30 tablet 4 10/31/2023   Blood Glucose Monitoring Suppl (ACCU-CHEK GUIDE) w/Device KIT 1  Device by Does not apply route 4 (four) times daily. 1 kit 0 Past Week   Blood Pressure Monitoring (BLOOD PRESSURE KIT) DEVI 1 Device by Does not apply route once a week. 1 each 0 11/01/2023   buPROPion  (WELLBUTRIN  XL) 300 MG 24 hr tablet Take 1 tablet (300 mg total) by mouth daily. 30 tablet 5 11/01/2023   famotidine  (PEPCID ) 20 MG tablet Take 1 tablet (20 mg total) by mouth 2 (two) times daily. 60 tablet 5 10/31/2023   glucose blood test strip Check blood sugars 4 times daily; once when waking up, and 2 hours after each meal. 100 each 12 Past Week   metroNIDAZOLE  (METROGEL ) 0.75 % vaginal gel Place 1 Applicatorful vaginally at bedtime. Apply one applicatorful to vagina at bedtime for 5 days 70 g 1 10/31/2023   NIFEdipine  (PROCARDIA  XL) 30 MG 24 hr tablet Take 1 tablet (30 mg total) by mouth daily. 30 tablet 2 11/01/2023   ondansetron  (ZOFRAN -ODT)  4 MG disintegrating tablet Take 1 tablet (4 mg total) by mouth every 8 (eight) hours as needed for nausea or vomiting. 15 tablet 0 Past Week   Prenatal Vit-Fe Fumarate-FA (MULTIVITAMIN-PRENATAL) 27-0.8 MG TABS tablet Take 1 tablet by mouth daily.   11/01/2023   sertraline  (ZOLOFT ) 50 MG tablet Take 1 tablet (50 mg total) by mouth daily. 90 tablet 3 Past Week   traZODone  (DESYREL ) 50 MG tablet Take 2 tablets (100 mg total) by mouth at bedtime. 30 tablet 1 11/01/2023   hydrOXYzine  (ATARAX ) 25 MG tablet Take 1 tablet (25 mg total) by mouth 3 (three) times daily as needed. 30 tablet 0 Unknown    I have reviewed patient's Past Medical Hx, Surgical Hx, Family Hx, Social Hx, medications and allergies.   ROS  Pertinent items noted in HPI and remainder of comprehensive ROS otherwise negative.   PHYSICAL EXAM  Patient Vitals for the past 24 hrs:  BP Temp Temp src Pulse Resp SpO2 Height Weight  11/01/23 1630 129/85 -- -- 90 -- -- -- --  11/01/23 1600 130/87 -- -- 86 -- -- -- --  11/01/23 1530 133/83 -- -- 92 -- 98 % -- --  11/01/23 1520 131/79 98.3 F (36.8 C) Oral 93 18 98 % -- --  11/01/23 1508 -- -- -- -- -- -- 5' 5 (1.651 m) 121.4 kg    Constitutional: Well-developed, well-nourished female in no acute distress. Patient tearful due to pain. HEENT: atraumatic, normocephalic. Neck has normal ROM. EOM intact. Cardiovascular: normal rate & rhythm, warm and well-perfused Respiratory: normal effort, no problems with respiration noted GI: Abd soft, non-tender, non-distended MSK: Extremities nontender, no edema, normal ROM Skin: warm and dry. Acyanotic, no jaundice or pallor. Neurologic: Alert and oriented x 4. No abnormal coordination. Psychiatric: Normal mood. Speech not slurred, not rapid/pressured. Patient is cooperative. GU: no CVA tenderness Pelvic exam: deferred  Fetal Tracing: Baseline FHR: 125 per minute Fetal heart variability: moderate Fetal Heart Rate accelerations: yes Fetal Heart  Rate decelerations: none Fetal Non-stress Test: Category I (reactive) Toco: no uterine contractions   Labs: Results for orders placed or performed during the hospital encounter of 11/01/23 (from the past 24 hours)  CBC     Status: Abnormal   Collection Time: 11/01/23  3:17 PM  Result Value Ref Range   WBC 9.9 4.0 - 10.5 K/uL   RBC 4.05 3.87 - 5.11 MIL/uL   Hemoglobin 11.2 (L) 12.0 - 15.0 g/dL   HCT 67.0 (L) 63.9 - 53.9 %  MCV 81.2 80.0 - 100.0 fL   MCH 27.7 26.0 - 34.0 pg   MCHC 34.0 30.0 - 36.0 g/dL   RDW 86.0 88.4 - 84.4 %   Platelets 219 150 - 400 K/uL   nRBC 0.0 0.0 - 0.2 %  Comprehensive metabolic panel with GFR     Status: Abnormal   Collection Time: 11/01/23  3:17 PM  Result Value Ref Range   Sodium 137 135 - 145 mmol/L   Potassium 3.5 3.5 - 5.1 mmol/L   Chloride 106 98 - 111 mmol/L   CO2 20 (L) 22 - 32 mmol/L   Glucose, Bld 95 70 - 99 mg/dL   BUN 5 (L) 6 - 20 mg/dL   Creatinine, Ser 9.42 0.44 - 1.00 mg/dL   Calcium 9.4 8.9 - 89.6 mg/dL   Total Protein 6.3 (L) 6.5 - 8.1 g/dL   Albumin 2.6 (L) 3.5 - 5.0 g/dL   AST 18 15 - 41 U/L   ALT 12 0 - 44 U/L   Alkaline Phosphatase 101 38 - 126 U/L   Total Bilirubin 0.4 0.0 - 1.2 mg/dL   GFR, Estimated >39 >39 mL/min   Anion gap 11 5 - 15  Protein / creatinine ratio, urine     Status: None   Collection Time: 11/01/23  5:03 PM  Result Value Ref Range   Creatinine, Urine 57 mg/dL   Total Protein, Urine <6 mg/dL   Protein Creatinine Ratio        0.00 - 0.15 mg/mg[Cre]   Imaging:  No results found.  MDM & MAU COURSE  MDM: High  MAU Course: -Vital signs within normal limits. Normotensive. -CMP, CBC, urine protein/creatinine ratio to rule out preeclampsia.  -Trial Excedrin-Tension, Flexeril , Reglan  for headache. -CMP stable. CBC shows decrease in Hgb from 12.2 yesterday to 11.2 today. -Endorses relief of headache pain after medications down to 4/10. -BP has remained within normal limits while in MAU. Suspect headache  may be secondary to nifedipine . -Discussed option of MRI to rule out intracranial abnormality, patient declines. Her biggest worry was preeclampsia, states that this is not the worst headache of her life. -urine protein/creatinine ratio within normal limits. -Switching nifedipine  to labetalol  due to headache. Discussed with Dr. Izell, switch to labetalol  200 mg BID.  Differential diagnosis considered for elevated blood pressure includes but is not limited to: high risk conditions like preeclampsia or gestational hypertension; chronic hypertension; transient spurious elevated blood pressure   Orders Placed This Encounter  Procedures   CBC   Comprehensive metabolic panel with GFR   Protein / creatinine ratio, urine   Discharge patient   Meds ordered this encounter  Medications   cyclobenzaprine  (FLEXERIL ) tablet 10 mg   metoCLOPramide  (REGLAN ) tablet 10 mg   acetaminophen -caffeine  (EXCEDRIN TENSION HEADACHE) 500-65 MG per tablet 2 tablet   lactated ringers  bolus 1,000 mL   labetalol  (NORMODYNE ) 200 MG tablet    Sig: Take 1 tablet (200 mg total) by mouth 2 (two) times daily.    Dispense:  60 tablet    Refill:  3    ASSESSMENT   1. Pregnancy headache in third trimester   2. Gestational hypertension, third trimester   3. [redacted] weeks gestation of pregnancy     PLAN  Discharge home in stable condition with return precautions.  Use Benadryl  at home for more relief of headache.  Discontinue nifedipine  due to headache. Switch to labetalol  200 mg BID. BP check in the office on 9/22 or 9/23. Message sent  to office staff to schedule.  Follow-up Information     [facility] for [facility] Follow up in 2 day(s).   Specialty: Obstetrics and Gynecology Why: blood pressure check Contact information: [address] [place]  [address] [phone]                 Allergies as of 11/01/2023       Reactions   Corn Fiber  Anaphylaxis, Swelling   Pt states that it is the silk that she is allergic to. Can eat can corn. Cannot shuck and touch skin and silk.   Percocet [oxycodone -acetaminophen ] Anaphylaxis, Other (See Comments)   States feels like chest is tightening up.   Other Itching, Nausea And Vomiting   Sherbert        Medication List     STOP taking these medications    NIFEdipine  30 MG 24 hr tablet Commonly known as: Procardia  XL       TAKE these medications    Accu-Chek Guide w/Device Kit 1 Device by Does not apply route 4 (four) times daily.   Accu-Chek Softclix Lancets lancets Check blood sugars 4 times daily; once when waking up, and 2 hours after each meal.   aspirin  EC 81 MG tablet Take 1 tablet (81 mg total) by mouth daily. Swallow whole.   Blood Pressure Kit Devi 1 Device by Does not apply route once a week.   buPROPion  300 MG 24 hr tablet Commonly known as: Wellbutrin  XL Take 1 tablet (300 mg total) by mouth daily.   famotidine  20 MG tablet Commonly known as: Pepcid  Take 1 tablet (20 mg total) by mouth 2 (two) times daily.   glucose blood test strip Check blood sugars 4 times daily; once when waking up, and 2 hours after each meal.   hydrOXYzine  25 MG tablet Commonly known as: ATARAX  Take 1 tablet (25 mg total) by mouth 3 (three) times daily as needed.   labetalol  200 MG tablet Commonly known as: NORMODYNE  Take 1 tablet (200 mg total) by mouth 2 (two) times daily.   metroNIDAZOLE  0.75 % vaginal gel Commonly known as: METROGEL  Place 1 Applicatorful vaginally at bedtime. Apply one applicatorful to vagina at bedtime for 5 days   multivitamin-prenatal 27-0.8 MG Tabs tablet Take 1 tablet by mouth daily.   ondansetron  4 MG disintegrating tablet Commonly known as: ZOFRAN -ODT Take 1 tablet (4 mg total) by mouth every 8 (eight) hours as needed for nausea or vomiting.   sertraline  50 MG tablet Commonly known as: ZOLOFT  Take 1 tablet (50 mg total) by mouth daily.    traZODone  50 MG tablet Commonly known as: DESYREL  Take 2 tablets (100 mg total) by mouth at bedtime.        Joesph DELENA Sear, PA

## 2023-11-01 NOTE — MAU Note (Signed)
 Barbara [name] is a 28 y.o. at [redacted]w[redacted]d here in MAU reporting: headache with elevated BP reading at home today (157/93). Headache began 0900 today describes as pounding and 7/10. Pt was seen in MAU last night and was prescribed 30mg  nifedipine  for HTN. Pt. Took first dose today at 1130 am. Took Tylenol  for HA at 0930 with no relief. Has had two bottles of water today. Baby is moving well today. Denies ctxs, VB, LOF.   Onset of complaint: 11/01/23 0900 Pain score: 7/10 Vitals:   11/01/23 1520  BP: 131/79  Pulse: 93  Resp: 18  Temp: 98.3 F (36.8 C)  SpO2: 98%     FHT: 125

## 2023-11-01 NOTE — Discharge Instructions (Signed)
 STOP taking nifedipine  since this is likely what caused your headache. START labetalol  200 mg twice daily. I recommend taking it first thing after waking up and then again about 12 hours later. When you check your blood pressure at home, make sure that it has been about 30-60 minutes since you took a dose of labetalol . The office will call you to schedule a blood pressure check there on Monday or Tuesday.  Reasons to return to MAU at [facility]: Your blood pressure is >160/110. You have a headache that will not go away after having something to eat, something to drink, and Tylenol . You have changes in your vision or upper abdominal pain that does not get better with Tylenol . Less than 36 weeks: Contractions feels like menstrual cramps. You should go to the hospital if you have more than 6 contractions in an hour, even after you have rested and drank at least 16 ounces of water.  More than 36 weeks: You begin to have strong, frequent contractions 5 minutes apart or less, each last 1 minute, these have been going on for 1-2 hours, and you cannot walk or talk during them. Your water breaks.  Sometimes it is a big gush of fluid. However, many times it may it may be much more subtle. You should go to the hospital if you have a constant leakage of fluid from your vagina, enough to soak a pad when you are walking around.  You have vaginal bleeding.  It is normal to have a small amount of spotting if your cervix was checked. If you have bleeding requiring the use of a pad, go to the hospital. You don't feel your baby moving like normal.  If you think that you baby's movement is decreased, eat a snack and rest on your left side in a quiet room for one hour. If you have not felt the baby move more than 6 times in an hour GO TO THE HOSPITAL.

## 2023-11-03 ENCOUNTER — Encounter: Payer: Self-pay | Admitting: Obstetrics and Gynecology

## 2023-11-03 ENCOUNTER — Ambulatory Visit (INDEPENDENT_AMBULATORY_CARE_PROVIDER_SITE_OTHER): Admitting: Obstetrics and Gynecology

## 2023-11-03 VITALS — BP 134/88 | HR 92 | Wt 263.0 lb

## 2023-11-03 DIAGNOSIS — O99210 Obesity complicating pregnancy, unspecified trimester: Secondary | ICD-10-CM | POA: Diagnosis not present

## 2023-11-03 DIAGNOSIS — O139 Gestational [pregnancy-induced] hypertension without significant proteinuria, unspecified trimester: Secondary | ICD-10-CM | POA: Insufficient documentation

## 2023-11-03 DIAGNOSIS — O0990 Supervision of high risk pregnancy, unspecified, unspecified trimester: Secondary | ICD-10-CM

## 2023-11-03 DIAGNOSIS — O24410 Gestational diabetes mellitus in pregnancy, diet controlled: Secondary | ICD-10-CM

## 2023-11-03 LAB — GC/CHLAMYDIA PROBE AMP (~~LOC~~) NOT AT ARMC
Chlamydia: NEGATIVE
Comment: NEGATIVE
Comment: NORMAL
Neisseria Gonorrhea: NEGATIVE

## 2023-11-03 NOTE — Progress Notes (Signed)
 Pt is in office for eval of BP. Pt recently seen in MAU with increase in BP's. Pt is taking Labetalol  as prescribed, states blood sugars are good. Pt has BPP tomorrow

## 2023-11-03 NOTE — Progress Notes (Signed)
   PRENATAL VISIT NOTE  Subjective:  Barbara [name] is a 28 y.o. [phone] at [redacted]w[redacted]d being seen today for ongoing prenatal care.  She is currently monitored for the following issues for this high-risk pregnancy and has Narcolepsy; Family history of cleft palate; GDM (gestational diabetes mellitus); History of gestational hypertension; BMI 30s; Severe recurrent major depression without psychotic features (HCC); Mild episode of recurrent major depressive disorder (HCC); Anxiety state; Supervision of high risk pregnancy, antepartum; Maternal obesity affecting pregnancy, antepartum; and Gestational hypertension, antepartum on their problem list.  Patient reports no complaints.  Contractions: Irregular. Vag. Bleeding: None.  Movement: Present. Denies leaking of fluid.   The following portions of the patient's history were reviewed and updated as appropriate: allergies, current medications, past family history, past medical history, past social history, past surgical history and problem list.   Objective:    Vitals:   11/03/23 1314  BP: 134/88  Pulse: 92  Weight: 263 lb (119.3 kg)    Fetal Status:  Fetal Heart Rate (bpm): 130   Movement: Present    General: Alert, oriented and cooperative. Patient is in no acute distress.  Skin: Skin is warm and dry. No rash noted.   Cardiovascular: Normal heart rate noted  Respiratory: Normal respiratory effort, no problems with respiration noted  Abdomen: Soft, gravid, appropriate for gestational age.  Pain/Pressure: Present     Pelvic: Cervical exam deferred        Extremities: Normal range of motion.     Mental Status: Normal mood and affect. Normal behavior. Normal judgment and thought content.   Assessment and Plan:  Pregnancy: [id number] at [redacted]w[redacted]d 1. Supervision of high risk pregnancy, antepartum (Primary) Patient is doing well without complaints Cultures next visit   2. Diet controlled gestational diabetes mellitus (GDM) in third trimester Patient  did not bring CBG log and reports all values within range  3. Obesity affecting pregnancy, antepartum, unspecified obesity type   4. Gestational hypertension, antepartum BP well controlled on labetalol  PLan for IOL at 37 weeks Antenatal testing scheduled to start tomorrow  Preterm labor symptoms and general obstetric precautions including but not limited to vaginal bleeding, contractions, leaking of fluid and fetal movement were reviewed in detail with the patient. Please refer to After Visit Summary for other counseling recommendations.   Return in about 1 week (around 11/10/2023) for in person, ROB, High risk.  Future Appointments  Date Time Provider Department Center  11/04/2023 11:15 AM [facility] PROVIDER 1 [facility]  11/04/2023 11:30 AM [facility] US5 [facility]  11/11/2023 11:15 AM [facility] PROVIDER 1 [facility]  11/11/2023 11:30 AM [facility] US2 [facility]  11/12/2023 10:15 AM Delores Nidia CROME, FNP [facility] None  11/18/2023 11:00 AM [facility] PROVIDER 1 [facility]  11/18/2023 11:30 AM [facility] US4 [facility]  11/25/2023 11:00 AM [facility] PROVIDER 1 [facility]  11/25/2023 11:30 AM [facility] US1 [facility]  12/02/2023 11:00 AM [facility] PROVIDER 1 [facility]  12/03/2023 11:30 AM [facility] US4 [facility]    Kamil Hanigan, MD

## 2023-11-04 ENCOUNTER — Other Ambulatory Visit: Payer: Self-pay | Admitting: Obstetrics

## 2023-11-04 ENCOUNTER — Ambulatory Visit (HOSPITAL_BASED_OUTPATIENT_CLINIC_OR_DEPARTMENT_OTHER)

## 2023-11-04 ENCOUNTER — Ambulatory Visit: Attending: Obstetrics and Gynecology | Admitting: Obstetrics and Gynecology

## 2023-11-04 VITALS — BP 132/83 | HR 103

## 2023-11-04 DIAGNOSIS — O24410 Gestational diabetes mellitus in pregnancy, diet controlled: Secondary | ICD-10-CM | POA: Insufficient documentation

## 2023-11-04 DIAGNOSIS — Z3A34 34 weeks gestation of pregnancy: Secondary | ICD-10-CM

## 2023-11-04 DIAGNOSIS — O139 Gestational [pregnancy-induced] hypertension without significant proteinuria, unspecified trimester: Secondary | ICD-10-CM

## 2023-11-04 DIAGNOSIS — O0990 Supervision of high risk pregnancy, unspecified, unspecified trimester: Secondary | ICD-10-CM

## 2023-11-04 DIAGNOSIS — O24913 Unspecified diabetes mellitus in pregnancy, third trimester: Secondary | ICD-10-CM

## 2023-11-04 DIAGNOSIS — O09293 Supervision of pregnancy with other poor reproductive or obstetric history, third trimester: Secondary | ICD-10-CM | POA: Diagnosis not present

## 2023-11-04 DIAGNOSIS — E6689 Other obesity not elsewhere classified: Secondary | ICD-10-CM | POA: Diagnosis not present

## 2023-11-04 DIAGNOSIS — O99212 Obesity complicating pregnancy, second trimester: Secondary | ICD-10-CM | POA: Diagnosis not present

## 2023-11-04 DIAGNOSIS — O99213 Obesity complicating pregnancy, third trimester: Secondary | ICD-10-CM

## 2023-11-04 DIAGNOSIS — E669 Obesity, unspecified: Secondary | ICD-10-CM

## 2023-11-04 NOTE — Progress Notes (Signed)
 Maternal-Fetal Medicine Consultation  Name: Barbara [name]  MRN: [id number]  GA: [id number] [redacted]w[redacted]d   Gestational diabetes.  Well-controlled on diet. Pregravid BMI 40.  Ultrasound Fetal growth is appropriate for gestational age.  Abdominal circumference measurement is at the 98th percentile.  Amniotic fluid is normal with good fetal activity seen.  Antenatal testing is reassuring.  BPP 8/8. I reassured the patient of the findings.  I explained the the reason for weekly antenatal testing from now till delivery. Grade 3 obesity is independently associated with increased risk of stillbirth (2.5- to 3-fold), but the absolute risk is very small.  Recommendations - Continue weekly antenatal testing till delivery.     Consultation including face-to-face (more than 50%) counseling 10 minutes.

## 2023-11-11 ENCOUNTER — Ambulatory Visit: Attending: Obstetrics | Admitting: Obstetrics

## 2023-11-11 ENCOUNTER — Ambulatory Visit

## 2023-11-11 VITALS — BP 135/93 | HR 136

## 2023-11-11 VITALS — BP 146/92 | HR 99

## 2023-11-11 DIAGNOSIS — O403XX0 Polyhydramnios, third trimester, not applicable or unspecified: Secondary | ICD-10-CM | POA: Diagnosis not present

## 2023-11-11 DIAGNOSIS — O24410 Gestational diabetes mellitus in pregnancy, diet controlled: Secondary | ICD-10-CM | POA: Diagnosis not present

## 2023-11-11 DIAGNOSIS — O139 Gestational [pregnancy-induced] hypertension without significant proteinuria, unspecified trimester: Secondary | ICD-10-CM

## 2023-11-11 DIAGNOSIS — Z362 Encounter for other antenatal screening follow-up: Secondary | ICD-10-CM | POA: Diagnosis not present

## 2023-11-11 DIAGNOSIS — E669 Obesity, unspecified: Secondary | ICD-10-CM | POA: Diagnosis not present

## 2023-11-11 DIAGNOSIS — O09293 Supervision of pregnancy with other poor reproductive or obstetric history, third trimester: Secondary | ICD-10-CM | POA: Diagnosis not present

## 2023-11-11 DIAGNOSIS — O10013 Pre-existing essential hypertension complicating pregnancy, third trimester: Secondary | ICD-10-CM

## 2023-11-11 DIAGNOSIS — O0990 Supervision of high risk pregnancy, unspecified, unspecified trimester: Secondary | ICD-10-CM

## 2023-11-11 DIAGNOSIS — O99213 Obesity complicating pregnancy, third trimester: Secondary | ICD-10-CM | POA: Diagnosis not present

## 2023-11-11 DIAGNOSIS — Z3A35 35 weeks gestation of pregnancy: Secondary | ICD-10-CM

## 2023-11-11 DIAGNOSIS — O24913 Unspecified diabetes mellitus in pregnancy, third trimester: Secondary | ICD-10-CM

## 2023-11-11 DIAGNOSIS — O99210 Obesity complicating pregnancy, unspecified trimester: Secondary | ICD-10-CM

## 2023-11-11 NOTE — Progress Notes (Signed)
 MFM Consult Note  Barbara [name] is currently at 35 weeks and 3 days.  She has been followed due to maternal obesity with a BMI of 40 and diet-controlled gestational diabetes.    She reports that she has not been monitoring her fingerstick values for the past week because she has been busy with her kids.    Her blood pressures today were 135/93 and 146/92.  A biophysical profile performed today was 8/8 .  Polyhydramnios with a total AFI of 29.54 cm was noted.    The fetus was in the vertex presentation.  Due to potentially uncontrolled gestational diabetes, polyhydramnios, and her mildly elevated blood pressures, delivery is recommended at around 37 weeks (after next week).  She will discuss scheduling an induction with you during her prenatal visit tomorrow.    She was advised that the polyhydramnios noted today may be an indicator of poor glycemic control.  She will return to our office in 1 week for another BPP.  Preeclampsia precautions were reviewed today.  The patient stated that all of her questions were answered today.  A total of 20 minutes was spent counseling and coordinating the care for this patient.  Greater than 50% of the time was spent in direct face-to-face contact.

## 2023-11-12 ENCOUNTER — Encounter: Payer: Self-pay | Admitting: Obstetrics and Gynecology

## 2023-11-12 ENCOUNTER — Other Ambulatory Visit: Payer: Self-pay | Admitting: Obstetrics and Gynecology

## 2023-11-12 ENCOUNTER — Other Ambulatory Visit (HOSPITAL_COMMUNITY)
Admission: RE | Admit: 2023-11-12 | Discharge: 2023-11-12 | Disposition: A | Discharge status: Home / Self Care | Source: Ambulatory Visit | Attending: Obstetrics and Gynecology | Admitting: Obstetrics and Gynecology

## 2023-11-12 ENCOUNTER — Ambulatory Visit (INDEPENDENT_AMBULATORY_CARE_PROVIDER_SITE_OTHER): Admitting: Obstetrics and Gynecology

## 2023-11-12 VITALS — BP 146/98 | HR 100 | Wt 263.8 lb

## 2023-11-12 DIAGNOSIS — O24410 Gestational diabetes mellitus in pregnancy, diet controlled: Secondary | ICD-10-CM

## 2023-11-12 DIAGNOSIS — Z3A36 36 weeks gestation of pregnancy: Secondary | ICD-10-CM | POA: Insufficient documentation

## 2023-11-12 DIAGNOSIS — O0990 Supervision of high risk pregnancy, unspecified, unspecified trimester: Secondary | ICD-10-CM

## 2023-11-12 DIAGNOSIS — Z3A35 35 weeks gestation of pregnancy: Secondary | ICD-10-CM | POA: Diagnosis not present

## 2023-11-12 DIAGNOSIS — O403XX0 Polyhydramnios, third trimester, not applicable or unspecified: Secondary | ICD-10-CM

## 2023-11-12 DIAGNOSIS — Z3483 Encounter for supervision of other normal pregnancy, third trimester: Secondary | ICD-10-CM | POA: Insufficient documentation

## 2023-11-12 DIAGNOSIS — O99210 Obesity complicating pregnancy, unspecified trimester: Secondary | ICD-10-CM

## 2023-11-12 DIAGNOSIS — O133 Gestational [pregnancy-induced] hypertension without significant proteinuria, third trimester: Secondary | ICD-10-CM

## 2023-11-12 NOTE — Progress Notes (Signed)
   PRENATAL VISIT NOTE  Subjective:  Barbara [name] is a 28 y.o. [phone] at [redacted]w[redacted]d being seen today for ongoing prenatal care.  She is currently monitored for the following issues for this high-risk pregnancy and has Narcolepsy; Family history of cleft palate; GDM (gestational diabetes mellitus); History of gestational hypertension; BMI 30s; Severe recurrent major depression without psychotic features (HCC); Mild episode of recurrent major depressive disorder; Anxiety state; Supervision of high risk pregnancy, antepartum; Maternal obesity affecting pregnancy, antepartum; and Gestational hypertension, antepartum on their problem list.  Patient reports no complaints.  Contractions: Irritability. Vag. Bleeding: None.  Movement: Present. Denies leaking of fluid.   The following portions of the patient's history were reviewed and updated as appropriate: allergies, current medications, past family history, past medical history, past social history, past surgical history and problem list.   Objective:    Vitals:   11/12/23 0959 11/12/23 1010  BP: (!) 144/98 (!) 146/98  Pulse: (!) 106 100  Weight: 263 lb 12.8 oz (119.7 kg)     Fetal Status:  Fetal Heart Rate (bpm): 139   Movement: Present    General: Alert, oriented and cooperative. Patient is in no acute distress.  Skin: Skin is warm and dry. No rash noted.   Cardiovascular: Normal heart rate noted  Respiratory: Normal respiratory effort, no problems with respiration noted  Abdomen: Soft, gravid, appropriate for gestational age.  Pain/Pressure: Present     Pelvic: Cervical exam deferred        Extremities: Normal range of motion.  Edema: Trace  Mental Status: Normal mood and affect. Normal behavior. Normal judgment and thought content.   Assessment and Plan:  Pregnancy: [id number] at [redacted]w[redacted]d 1. Supervision of high risk pregnancy, antepartum (Primary)   2. Diet controlled gestational diabetes mellitus (GDM) in third trimester Was previously  checking sugars and controlled with diet, has not been checking sugars mfm recommended IOL d/t GDM and poly  Encouraged checking sugars monitoring diet   3. [redacted] weeks gestation of pregnancy Swabs today  - Cervicovaginal ancillary only( [place]) - Culture, beta strep (group b only)  4. Obesity affecting pregnancy, antepartum, unspecified obesity type Following MFM  5. Polyhydramnios in third trimester complication, single or unspecified fetus 9/30 afi 29.54 received message from MFM to schedule IOL 37 weeks  6. Gestational hypertension, third trimester Elevated today No pec s&s, precautions discussed when to go to hospital  Checking labs today  Labetalol  200mg  BID  IOL 10/13  Preterm labor symptoms and general obstetric precautions including but not limited to vaginal bleeding, contractions, leaking of fluid and fetal movement were reviewed in detail with the patient. Please refer to After Visit Summary for other counseling recommendations.    Future Appointments  Date Time Provider Department Center  11/18/2023 11:00 AM [facility] PROVIDER 1 [facility]  11/18/2023 11:30 AM [facility] US4 [facility]  11/24/2023  7:15 AM [facility] SCHED ROOM [facility] None    Nidia Daring, FNP

## 2023-11-12 NOTE — Progress Notes (Signed)
 GDM; pt states she hasn't checked blood sugars this week due to child having surgery (broken arm).   PHQ score of 9 and GAD @ 5; referral sent; pt aware and agreeable to counselor.   BP 144/98 and 146/98; pt on labetolol. Pt states she had high BP at her US  yesterday as well. Denies dizziness or vision changes.

## 2023-11-13 ENCOUNTER — Inpatient Hospital Stay (HOSPITAL_COMMUNITY): Admit: 2023-11-13 | Admitting: Obstetrics and Gynecology

## 2023-11-13 ENCOUNTER — Inpatient Hospital Stay (HOSPITAL_COMMUNITY): Admission: AD | Admit: 2023-11-13 | Discharge: 2023-11-14 | Disposition: A | Discharge status: Home / Self Care

## 2023-11-13 DIAGNOSIS — O09293 Supervision of pregnancy with other poor reproductive or obstetric history, third trimester: Secondary | ICD-10-CM | POA: Insufficient documentation

## 2023-11-13 DIAGNOSIS — O139 Gestational [pregnancy-induced] hypertension without significant proteinuria, unspecified trimester: Secondary | ICD-10-CM | POA: Insufficient documentation

## 2023-11-13 DIAGNOSIS — Z3A35 35 weeks gestation of pregnancy: Secondary | ICD-10-CM | POA: Insufficient documentation

## 2023-11-13 DIAGNOSIS — O0993 Supervision of high risk pregnancy, unspecified, third trimester: Secondary | ICD-10-CM | POA: Insufficient documentation

## 2023-11-13 DIAGNOSIS — O4703 False labor before 37 completed weeks of gestation, third trimester: Secondary | ICD-10-CM | POA: Insufficient documentation

## 2023-11-13 DIAGNOSIS — O479 False labor, unspecified: Secondary | ICD-10-CM

## 2023-11-13 DIAGNOSIS — O10919 Unspecified pre-existing hypertension complicating pregnancy, unspecified trimester: Secondary | ICD-10-CM

## 2023-11-13 DIAGNOSIS — O0990 Supervision of high risk pregnancy, unspecified, unspecified trimester: Secondary | ICD-10-CM

## 2023-11-13 DIAGNOSIS — O09299 Supervision of pregnancy with other poor reproductive or obstetric history, unspecified trimester: Secondary | ICD-10-CM

## 2023-11-13 LAB — COMPREHENSIVE METABOLIC PANEL WITH GFR
ALT: 8 IU/L (ref 0–32)
AST: 15 IU/L (ref 0–40)
Albumin: 3.8 g/dL — ABNORMAL LOW (ref 4.0–5.0)
Alkaline Phosphatase: 159 IU/L — ABNORMAL HIGH (ref 41–116)
BUN/Creatinine Ratio: 17 (ref 9–23)
BUN: 9 mg/dL (ref 6–20)
Bilirubin Total: 0.2 mg/dL (ref 0.0–1.2)
CO2: 16 mmol/L — ABNORMAL LOW (ref 20–29)
Calcium: 9.7 mg/dL (ref 8.7–10.2)
Chloride: 104 mmol/L (ref 96–106)
Creatinine, Ser: 0.53 mg/dL — ABNORMAL LOW (ref 0.57–1.00)
Globulin, Total: 2.7 g/dL (ref 1.5–4.5)
Glucose: 81 mg/dL (ref 70–99)
Potassium: 4.3 mmol/L (ref 3.5–5.2)
Sodium: 132 mmol/L — ABNORMAL LOW (ref 134–144)
Total Protein: 6.5 g/dL (ref 6.0–8.5)
eGFR: 128 mL/min/1.73 (ref >59)

## 2023-11-13 LAB — CBC
Hematocrit: 39.9 % (ref 34.0–46.6)
Hemoglobin: 13 g/dL (ref 11.1–15.9)
MCH: 27.3 pg (ref 26.6–33.0)
MCHC: 32.6 g/dL (ref 31.5–35.7)
MCV: 84 fL (ref 79–97)
Platelets: 247 x10E3/uL (ref 150–450)
RBC: 4.76 x10E6/uL (ref 3.77–5.28)
RDW: 14.3 % (ref 11.7–15.4)
WBC: 13.3 x10E3/uL — ABNORMAL HIGH (ref 3.4–10.8)

## 2023-11-13 LAB — PROTEIN / CREATININE RATIO, URINE
Creatinine, Urine: 103.8 mg/dL
Protein, Ur: 28.3 mg/dL
Protein/Creat Ratio: 273 mg/g{creat} — ABNORMAL HIGH (ref 0–200)

## 2023-11-13 LAB — CERVICOVAGINAL ANCILLARY ONLY
Bacterial Vaginitis (gardnerella): POSITIVE — AB
Candida Glabrata: NEGATIVE
Candida Vaginitis: NEGATIVE
Chlamydia: NEGATIVE
Comment: NEGATIVE
Comment: NEGATIVE
Comment: NEGATIVE
Comment: NEGATIVE
Comment: NEGATIVE
Comment: NORMAL
Neisseria Gonorrhea: NEGATIVE
Trichomonas: NEGATIVE

## 2023-11-14 ENCOUNTER — Ambulatory Visit: Payer: Self-pay | Admitting: Obstetrics and Gynecology

## 2023-11-14 ENCOUNTER — Other Ambulatory Visit (HOSPITAL_COMMUNITY): Payer: Self-pay

## 2023-11-14 ENCOUNTER — Other Ambulatory Visit: Payer: Self-pay

## 2023-11-14 DIAGNOSIS — O09293 Supervision of pregnancy with other poor reproductive or obstetric history, third trimester: Secondary | ICD-10-CM | POA: Diagnosis not present

## 2023-11-14 DIAGNOSIS — O139 Gestational [pregnancy-induced] hypertension without significant proteinuria, unspecified trimester: Secondary | ICD-10-CM | POA: Diagnosis not present

## 2023-11-14 DIAGNOSIS — O0993 Supervision of high risk pregnancy, unspecified, third trimester: Secondary | ICD-10-CM | POA: Diagnosis present

## 2023-11-14 DIAGNOSIS — O4703 False labor before 37 completed weeks of gestation, third trimester: Secondary | ICD-10-CM | POA: Diagnosis not present

## 2023-11-14 DIAGNOSIS — Z3A35 35 weeks gestation of pregnancy: Secondary | ICD-10-CM | POA: Diagnosis not present

## 2023-11-14 DIAGNOSIS — O10913 Unspecified pre-existing hypertension complicating pregnancy, third trimester: Secondary | ICD-10-CM | POA: Diagnosis not present

## 2023-11-14 LAB — CBC WITH DIFFERENTIAL/PLATELET
Abs Immature Granulocytes: 0.09 K/uL — ABNORMAL HIGH (ref 0.00–0.07)
Basophils Absolute: 0 K/uL (ref 0.0–0.1)
Basophils Relative: 0 %
Eosinophils Absolute: 0.1 K/uL (ref 0.0–0.5)
Eosinophils Relative: 1 %
HCT: 35.8 % — ABNORMAL LOW (ref 36.0–46.0)
Hemoglobin: 12.3 g/dL (ref 12.0–15.0)
Immature Granulocytes: 1 %
Lymphocytes Relative: 22 %
Lymphs Abs: 2.5 K/uL (ref 0.7–4.0)
MCH: 27.2 pg (ref 26.0–34.0)
MCHC: 34.4 g/dL (ref 30.0–36.0)
MCV: 79.2 fL — ABNORMAL LOW (ref 80.0–100.0)
Monocytes Absolute: 0.9 K/uL (ref 0.1–1.0)
Monocytes Relative: 8 %
Neutro Abs: 7.7 K/uL (ref 1.7–7.7)
Neutrophils Relative %: 68 %
Platelets: 235 K/uL (ref 150–400)
RBC: 4.52 MIL/uL (ref 3.87–5.11)
RDW: 13.8 % (ref 11.5–15.5)
WBC: 11.2 K/uL — ABNORMAL HIGH (ref 4.0–10.5)
nRBC: 0 % (ref 0.0–0.2)

## 2023-11-14 LAB — PROTEIN / CREATININE RATIO, URINE
Creatinine, Urine: 189 mg/dL
Protein Creatinine Ratio: 0.24 mg/mg{creat} — ABNORMAL HIGH (ref 0.00–0.15)
Total Protein, Urine: 45 mg/dL

## 2023-11-14 LAB — COMPREHENSIVE METABOLIC PANEL WITH GFR
ALT: 10 U/L (ref 0–44)
AST: 15 U/L (ref 15–41)
Albumin: 2.6 g/dL — ABNORMAL LOW (ref 3.5–5.0)
Alkaline Phosphatase: 114 U/L (ref 38–126)
Anion gap: 9 (ref 5–15)
BUN: 9 mg/dL (ref 6–20)
CO2: 18 mmol/L — ABNORMAL LOW (ref 22–32)
Calcium: 9.4 mg/dL (ref 8.9–10.3)
Chloride: 109 mmol/L (ref 98–111)
Creatinine, Ser: 0.52 mg/dL (ref 0.44–1.00)
GFR, Estimated: >60 mL/min (ref >60)
Glucose, Bld: 112 mg/dL — ABNORMAL HIGH (ref 70–99)
Potassium: 4 mmol/L (ref 3.5–5.1)
Sodium: 136 mmol/L (ref 135–145)
Total Bilirubin: 0.5 mg/dL (ref 0.0–1.2)
Total Protein: 6.5 g/dL (ref 6.5–8.1)

## 2023-11-14 MED ORDER — NIFEDIPINE 10 MG PO CAPS
10.0000 mg | ORAL_CAPSULE | Freq: Three times a day (TID) | ORAL | 0 refills | Status: DC | PRN
  Filled 2023-11-14: qty 30, 10d supply, fill #0

## 2023-11-14 MED ORDER — METRONIDAZOLE 0.75 % VA GEL
1.0000 | Freq: Every day | VAGINAL | 0 refills | Status: DC
  Filled 2023-11-14: qty 70, 5d supply, fill #0

## 2023-11-14 MED ORDER — LABETALOL HCL 200 MG PO TABS
200.0000 mg | ORAL_TABLET | Freq: Three times a day (TID) | ORAL | 0 refills | Status: DC
  Filled 2023-11-14: qty 90, 30d supply, fill #0

## 2023-11-14 MED ORDER — NIFEDIPINE 10 MG PO CAPS
10.0000 mg | ORAL_CAPSULE | Freq: Once | ORAL | Status: AC
  Administered 2023-11-14: 10 mg via ORAL
  Filled 2023-11-14: qty 1

## 2023-11-14 NOTE — MAU Note (Cosign Needed)
 Maternal Assessment Unit Provider Note  Subjective: Barbara [name] is a 28 y.o. [phone] pregnant female at [redacted]w[redacted]d who presents to MAU today with complaint of concern about lab results.   Patient had preeclampsia labs done on 10/1.  She received the results through MyChart and noted several values marked as abnormal and got really worried due to a history of preeclampsia with her last child so came to the MAU.    She reports a migraine on 10/2 that went away with Tylenol . Currently asymptomatic no headache, no vision changes no right upper quadrant pain, no significant lower extremity edema.  She does note irregular contractions that started today, no vaginal bleeding, leaking fluid, +good fetal movement.  Receives care at [place]. Prenatal records reviewed.  Pertinent items noted in HPI and remainder of comprehensive ROS otherwise negative.   Objective: BP (!) 138/96   Pulse 85   Temp 97.8 F (36.6 C)   Resp 17   Ht 5' 5 (1.651 m)   Wt 121.1 kg   LMP  (LMP Unknown)   SpO2 100%   BMI 44.43 kg/m  Physical Exam Vitals and nursing note reviewed.  Constitutional:      General: She is not in acute distress.    Appearance: She is well-developed. She is not diaphoretic.  HENT:     Head: Normocephalic and atraumatic.  Eyes:     General: No scleral icterus. Cardiovascular:     Rate and Rhythm: Normal rate and regular rhythm.     Heart sounds: Normal heart sounds.  Pulmonary:     Effort: Pulmonary effort is normal. No respiratory distress.  Abdominal:     General: There is no distension.     Palpations: Abdomen is soft.     Tenderness: There is no abdominal tenderness. There is no guarding or rebound.  Skin:    General: Skin is warm and dry.  Neurological:     Mental Status: She is alert.     Coordination: Coordination normal.     Results for orders placed or performed during the hospital encounter of 11/13/23 (from the past 24 hours)  Protein / creatinine ratio,  urine     Status: Abnormal   Collection Time: 11/14/23 12:48 AM  Result Value Ref Range   Creatinine, Urine 189 mg/dL   Total Protein, Urine 45 mg/dL   Protein Creatinine Ratio 0.24 (H) 0.00 - 0.15 mg/mg[Cre]  CBC with Differential/Platelet     Status: Abnormal   Collection Time: 11/14/23 12:59 AM  Result Value Ref Range   WBC 11.2 (H) 4.0 - 10.5 K/uL   RBC 4.52 3.87 - 5.11 MIL/uL   Hemoglobin 12.3 12.0 - 15.0 g/dL   HCT 64.1 (L) 63.9 - 53.9 %   MCV 79.2 (L) 80.0 - 100.0 fL   MCH 27.2 26.0 - 34.0 pg   MCHC 34.4 30.0 - 36.0 g/dL   RDW 86.1 88.4 - 84.4 %   Platelets 235 150 - 400 K/uL   nRBC 0.0 0.0 - 0.2 %   Neutrophils Relative % 68 %   Neutro Abs 7.7 1.7 - 7.7 K/uL   Lymphocytes Relative 22 %   Lymphs Abs 2.5 0.7 - 4.0 K/uL   Monocytes Relative 8 %   Monocytes Absolute 0.9 0.1 - 1.0 K/uL   Eosinophils Relative 1 %   Eosinophils Absolute 0.1 0.0 - 0.5 K/uL   Basophils Relative 0 %   Basophils Absolute 0.0 0.0 - 0.1 K/uL   Immature Granulocytes 1 %  Abs Immature Granulocytes 0.09 (H) 0.00 - 0.07 K/uL  Comprehensive metabolic panel     Status: Abnormal   Collection Time: 11/14/23 12:59 AM  Result Value Ref Range   Sodium 136 135 - 145 mmol/L   Potassium 4.0 3.5 - 5.1 mmol/L   Chloride 109 98 - 111 mmol/L   CO2 18 (L) 22 - 32 mmol/L   Glucose, Bld 112 (H) 70 - 99 mg/dL   BUN 9 6 - 20 mg/dL   Creatinine, Ser 9.47 0.44 - 1.00 mg/dL   Calcium 9.4 8.9 - 89.6 mg/dL   Total Protein 6.5 6.5 - 8.1 g/dL   Albumin 2.6 (L) 3.5 - 5.0 g/dL   AST 15 15 - 41 U/L   ALT 10 0 - 44 U/L   Alkaline Phosphatase 114 38 - 126 U/L   Total Bilirubin 0.5 0.0 - 1.2 mg/dL   GFR, Estimated >39 >39 mL/min   Anion gap 9 5 - 15      MDM: moderate risk  MAU Course:  Time: 0100  Labs ordered  FHT: baseline 140 bpm, moderate variability, + accelerations, no decelerations,   Contractions: q 1-2 mins/uterine irritability, patient not feeling  Time: 0300  Interviewed patient. Reviewed lab  results with her. Plan in place to increase Labetalol  to 200mg  TID. Gave babyRx information. She has an IOL scheduled for 10/13.  Gave strict return precautions for pre-eclampsia symptoms. Pt reporting intermittent painful abdominal tightening throughout the day, but no increase in symptoms.  Digital cervical exam: 1/thick/ballotable  Time: 0305  Returned to room to inquire details of work note requested. Pt rocking, reporting intermittent back and LLQ abdo pain. Appears to be having a ctx.  Will monitor for an hour and recheck cervix.  10mg  procardia  given.  Time: 0509  Patient feeling more comfortable.  Cervical exam: 1/thick/ballotable No cervical change after 2hrs of observation  10mg  procardia  prescribed for PRN contraction pain.  Questions were answered to the satisfaction of the patient and/or family prior to discharge.   Assessment Medical screening exam complete    ICD-10-CM   1. Supervision of high risk pregnancy, antepartum  O09.90     2. Gestational hypertension, antepartum  O13.9     3. Chronic hypertension affecting pregnancy  O10.919     4. History of pre-eclampsia in prior pregnancy, currently pregnant  O09.299     5. 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 Can eat can corn. Cannot shuck and touch skin and silk.   Percocet [oxycodone -acetaminophen ] Anaphylaxis, Other (See Comments)   States feels like chest is tightening up.   Other Itching, Nausea And Vomiting   [place]        Medication List     STOP taking these medications    hydrOXYzine  25 MG tablet Commonly known as: ATARAX        TAKE these medications    Accu-Chek Guide w/Device Kit 1 Device by Does not apply  route 4 (four) times daily.   Accu-Chek Softclix Lancets lancets Check blood sugars 4 times daily; once when waking up, and 2 hours after each meal.   aspirin  EC 81 MG tablet Take 1 tablet (81  mg total) by mouth daily. Swallow whole.   Blood Pressure Kit Devi 1 Device by Does not apply route once a week.   buPROPion  300 MG 24 hr tablet Commonly known as: Wellbutrin  XL Take 1 tablet (300 mg total) by mouth daily.   famotidine  20 MG tablet Commonly known as: Pepcid  Take 1 tablet (20 mg total) by mouth 2 (two) times daily.   glucose blood test strip Check blood sugars 4 times daily; once when waking up, and 2 hours after each meal.   labetalol  200 MG tablet Commonly known as: NORMODYNE  Take 1 tablet (200 mg total) by mouth 3 (three) times daily. What changed: when to take this   metroNIDAZOLE  0.75 % vaginal gel Commonly known as: METROGEL  Place 1 Applicatorful vaginally at bedtime. Apply one applicatorful to vagina at bedtime for 5 days   multivitamin-prenatal 27-0.8 MG Tabs tablet Take 1 tablet by mouth daily.   ondansetron  4 MG disintegrating tablet Commonly known as: ZOFRAN -ODT Take 1 tablet (4 mg total) by mouth every 8 (eight) hours as needed for nausea or vomiting.   sertraline  50 MG tablet Commonly known as: ZOLOFT  Take 1 tablet (50 mg total) by mouth daily.   traZODone  50 MG tablet Commonly known as: DESYREL  Take 2 tablets (100 mg total) by mouth at bedtime.        Trudy Leeroy NOVAK, MD 11/14/2023 3:12 AM

## 2023-11-14 NOTE — MAU Note (Signed)
 Barbara [name] is a 28 y.o. at [redacted]w[redacted]d here in MAU reporting having PreE labs done Oct 1st. She received her results via MyChart and some were abnormal. She called on call person and was told to come in. Reports occ ctxs. Denies VB or LOF. Reports good FM. Had migraine yesterday and took Tylenol  and it went away. No h/a today.   LMP: na Onset of complaint: tonight got her results Pain score: 0 Vitals:   11/14/23 0003  BP: (!) 144/85  Pulse: 99  Resp: 18  Temp: 97.8 F (36.6 C)  SpO2: 100%     FHT: 144  Lab orders placed from triage: none

## 2023-11-14 NOTE — Discharge Instructions (Signed)

## 2023-11-15 LAB — CULTURE, BETA STREP (GROUP B ONLY): Strep Gp B Culture: POSITIVE — AB

## 2023-11-16 ENCOUNTER — Encounter: Payer: Self-pay | Admitting: Obstetrics and Gynecology

## 2023-11-16 ENCOUNTER — Ambulatory Visit: Payer: Self-pay | Admitting: Obstetrics and Gynecology

## 2023-11-16 DIAGNOSIS — O99820 Streptococcus B carrier state complicating pregnancy: Secondary | ICD-10-CM | POA: Insufficient documentation

## 2023-11-17 ENCOUNTER — Other Ambulatory Visit: Payer: Self-pay

## 2023-11-18 ENCOUNTER — Encounter (HOSPITAL_COMMUNITY): Payer: Self-pay

## 2023-11-18 ENCOUNTER — Ambulatory Visit: Attending: Obstetrics and Gynecology | Admitting: Obstetrics

## 2023-11-18 ENCOUNTER — Ambulatory Visit (HOSPITAL_BASED_OUTPATIENT_CLINIC_OR_DEPARTMENT_OTHER)

## 2023-11-18 VITALS — BP 133/85

## 2023-11-18 DIAGNOSIS — O99213 Obesity complicating pregnancy, third trimester: Secondary | ICD-10-CM | POA: Insufficient documentation

## 2023-11-18 DIAGNOSIS — Z3A36 36 weeks gestation of pregnancy: Secondary | ICD-10-CM | POA: Insufficient documentation

## 2023-11-18 DIAGNOSIS — O403XX0 Polyhydramnios, third trimester, not applicable or unspecified: Secondary | ICD-10-CM | POA: Diagnosis not present

## 2023-11-18 DIAGNOSIS — O24913 Unspecified diabetes mellitus in pregnancy, third trimester: Secondary | ICD-10-CM | POA: Insufficient documentation

## 2023-11-18 DIAGNOSIS — O24410 Gestational diabetes mellitus in pregnancy, diet controlled: Secondary | ICD-10-CM | POA: Diagnosis not present

## 2023-11-18 DIAGNOSIS — O0990 Supervision of high risk pregnancy, unspecified, unspecified trimester: Secondary | ICD-10-CM

## 2023-11-18 DIAGNOSIS — E669 Obesity, unspecified: Secondary | ICD-10-CM | POA: Diagnosis not present

## 2023-11-18 DIAGNOSIS — O139 Gestational [pregnancy-induced] hypertension without significant proteinuria, unspecified trimester: Secondary | ICD-10-CM

## 2023-11-18 DIAGNOSIS — O09293 Supervision of pregnancy with other poor reproductive or obstetric history, third trimester: Secondary | ICD-10-CM

## 2023-11-18 DIAGNOSIS — O99210 Obesity complicating pregnancy, unspecified trimester: Secondary | ICD-10-CM

## 2023-11-18 DIAGNOSIS — O10013 Pre-existing essential hypertension complicating pregnancy, third trimester: Secondary | ICD-10-CM | POA: Diagnosis not present

## 2023-11-18 NOTE — Progress Notes (Signed)
 Scanned document reviewed

## 2023-11-18 NOTE — Progress Notes (Signed)
 MFM Consult Note  Barbara [name] is currently at 36 weeks and 3 days.  She has been followed due to maternal obesity with a BMI of 40, diet-controlled gestational diabetes, and polyhydramnios.    She denies any problems since her last exam.  She reports that her fingerstick values have mostly been within normal limits.  Her blood pressure today was 133/85.  A biophysical profile performed today was 8/8.  Polyhydramnios with a total AFI of 34.09 cm was noted.    The fetus was in the vertex presentation.  Due to potentially uncontrolled gestational diabetes, polyhydramnios, and her mildly elevated blood pressures, she already has an induction scheduled on November 24, 2023 (in 6 days).  No further exams were scheduled in our office.

## 2023-11-19 ENCOUNTER — Ambulatory Visit (INDEPENDENT_AMBULATORY_CARE_PROVIDER_SITE_OTHER): Admitting: Obstetrics and Gynecology

## 2023-11-19 ENCOUNTER — Encounter: Payer: Self-pay | Admitting: Pharmacist

## 2023-11-19 ENCOUNTER — Other Ambulatory Visit: Payer: Self-pay

## 2023-11-19 VITALS — BP 129/89 | HR 97 | Wt 268.0 lb

## 2023-11-19 DIAGNOSIS — O99210 Obesity complicating pregnancy, unspecified trimester: Secondary | ICD-10-CM

## 2023-11-19 DIAGNOSIS — O0990 Supervision of high risk pregnancy, unspecified, unspecified trimester: Secondary | ICD-10-CM

## 2023-11-19 DIAGNOSIS — O139 Gestational [pregnancy-induced] hypertension without significant proteinuria, unspecified trimester: Secondary | ICD-10-CM | POA: Diagnosis not present

## 2023-11-19 DIAGNOSIS — O24410 Gestational diabetes mellitus in pregnancy, diet controlled: Secondary | ICD-10-CM | POA: Diagnosis not present

## 2023-11-19 DIAGNOSIS — Z3A36 36 weeks gestation of pregnancy: Secondary | ICD-10-CM | POA: Diagnosis not present

## 2023-11-19 DIAGNOSIS — O409XX0 Polyhydramnios, unspecified trimester, not applicable or unspecified: Secondary | ICD-10-CM

## 2023-11-19 DIAGNOSIS — O99820 Streptococcus B carrier state complicating pregnancy: Secondary | ICD-10-CM

## 2023-11-19 NOTE — Progress Notes (Signed)
 Highest glucose readings 125, lowest in the 80's.

## 2023-11-19 NOTE — Progress Notes (Signed)
   PRENATAL VISIT NOTE  Subjective:  Barbara [name] is a 28 y.o. [phone] at [redacted]w[redacted]d being seen today for ongoing prenatal care.  She is currently monitored for the following issues for this high-risk pregnancy and has Narcolepsy; Family history of cleft palate; GDM (gestational diabetes mellitus); History of gestational hypertension; BMI 30s; Severe recurrent major depression without psychotic features (HCC); Mild episode of recurrent major depressive disorder; Anxiety state; Supervision of high risk pregnancy, antepartum; Maternal obesity affecting pregnancy, antepartum; Gestational hypertension, antepartum; Group B Streptococcus carrier, +RV culture, currently pregnant; and Polyhydramnios affecting pregnancy on their problem list.  Patient doing well with no acute concerns today. She reports no complaints.  Contractions: Irregular. Vag. Bleeding: None.  Movement: Present. Denies leaking of fluid.  BPP on 11/18/23 was 8/8  The following portions of the patient's history were reviewed and updated as appropriate: allergies, current medications, past family history, past medical history, past social history, past surgical history and problem list. Problem list updated.  Objective:   Vitals:   11/19/23 1428  BP: 129/89  Pulse: 97  Weight: 268 lb (121.6 kg)    Fetal Status: Fetal Heart Rate (bpm): 135 Fundal Height: 42 cm Movement: Present     General:  Alert, oriented and cooperative. Patient is in no acute distress.  Skin: Skin is warm and dry. No rash noted.   Cardiovascular: Normal heart rate noted  Respiratory: Normal respiratory effort, no problems with respiration noted  Abdomen: Soft, gravid, appropriate for gestational age.  Pain/Pressure: Present     Pelvic: Cervical exam performed Dilation: Fingertip Effacement (%): 60 Station: Ballotable  Extremities: Normal range of motion.     Mental Status:  Normal mood and affect. Normal behavior. Normal judgment and thought content.    Assessment and Plan:  Pregnancy: [id number] at [redacted]w[redacted]d  1. Supervision of high risk pregnancy, antepartum (Primary) Pt has been receiving weekly testing, IOL scheduled for 11/24/23  2. [redacted] weeks gestation of pregnancy   3. Gestational hypertension, antepartum BP is upper limits of normal  4. Diet controlled gestational diabetes mellitus (GDM) in third trimester FBS: 80-82 average PPBS: 90-125  5. Group B Streptococcus carrier, +RV culture, currently pregnant Treat in labor  6. Obesity affecting pregnancy, antepartum, unspecified obesity type   7. Polyhydramnios affecting pregnancy BPP this week was 8/8, last recorded AFI was 34  Preterm labor symptoms and general obstetric precautions including but not limited to vaginal bleeding, contractions, leaking of fluid and fetal movement were reviewed in detail with the patient.  Please refer to After Visit Summary for other counseling recommendations.   No follow-ups on file.   Jerilynn Buddle, MD Faculty Attending Center for [facility]

## 2023-11-21 ENCOUNTER — Encounter (HOSPITAL_COMMUNITY): Payer: Self-pay | Admitting: *Deleted

## 2023-11-21 ENCOUNTER — Telehealth (HOSPITAL_COMMUNITY): Payer: Self-pay | Admitting: *Deleted

## 2023-11-21 NOTE — Telephone Encounter (Signed)
 Preadmission screen

## 2023-11-24 ENCOUNTER — Inpatient Hospital Stay (HOSPITAL_COMMUNITY)

## 2023-11-24 ENCOUNTER — Inpatient Hospital Stay (HOSPITAL_COMMUNITY): Admitting: Anesthesiology

## 2023-11-24 ENCOUNTER — Inpatient Hospital Stay (HOSPITAL_COMMUNITY)
Admission: RE | Admit: 2023-11-24 | Discharge: 2023-11-26 | DRG: 806 | Disposition: A | Discharge status: Home / Self Care | Payer: Self-pay | Attending: Family Medicine | Admitting: Family Medicine

## 2023-11-24 ENCOUNTER — Other Ambulatory Visit: Payer: Self-pay

## 2023-11-24 ENCOUNTER — Encounter (HOSPITAL_COMMUNITY): Payer: Self-pay | Admitting: Obstetrics and Gynecology

## 2023-11-24 DIAGNOSIS — O9902 Anemia complicating childbirth: Secondary | ICD-10-CM | POA: Diagnosis present

## 2023-11-24 DIAGNOSIS — F332 Major depressive disorder, recurrent severe without psychotic features: Secondary | ICD-10-CM | POA: Diagnosis present

## 2023-11-24 DIAGNOSIS — Z560 Unemployment, unspecified: Secondary | ICD-10-CM

## 2023-11-24 DIAGNOSIS — O99824 Streptococcus B carrier state complicating childbirth: Secondary | ICD-10-CM | POA: Diagnosis present

## 2023-11-24 DIAGNOSIS — O134 Gestational [pregnancy-induced] hypertension without significant proteinuria, complicating childbirth: Secondary | ICD-10-CM | POA: Diagnosis present

## 2023-11-24 DIAGNOSIS — Z79899 Other long term (current) drug therapy: Secondary | ICD-10-CM | POA: Diagnosis not present

## 2023-11-24 DIAGNOSIS — Z818 Family history of other mental and behavioral disorders: Secondary | ICD-10-CM

## 2023-11-24 DIAGNOSIS — Z3A37 37 weeks gestation of pregnancy: Secondary | ICD-10-CM | POA: Diagnosis not present

## 2023-11-24 DIAGNOSIS — Z833 Family history of diabetes mellitus: Secondary | ICD-10-CM

## 2023-11-24 DIAGNOSIS — E66813 Obesity, class 3: Secondary | ICD-10-CM | POA: Diagnosis present

## 2023-11-24 DIAGNOSIS — O99214 Obesity complicating childbirth: Secondary | ICD-10-CM | POA: Diagnosis present

## 2023-11-24 DIAGNOSIS — O99344 Other mental disorders complicating childbirth: Secondary | ICD-10-CM | POA: Diagnosis present

## 2023-11-24 DIAGNOSIS — O24420 Gestational diabetes mellitus in childbirth, diet controlled: Secondary | ICD-10-CM | POA: Diagnosis present

## 2023-11-24 DIAGNOSIS — O403XX0 Polyhydramnios, third trimester, not applicable or unspecified: Secondary | ICD-10-CM | POA: Diagnosis present

## 2023-11-24 DIAGNOSIS — O99820 Streptococcus B carrier state complicating pregnancy: Secondary | ICD-10-CM | POA: Diagnosis not present

## 2023-11-24 DIAGNOSIS — O139 Gestational [pregnancy-induced] hypertension without significant proteinuria, unspecified trimester: Principal | ICD-10-CM | POA: Diagnosis present

## 2023-11-24 DIAGNOSIS — Z8249 Family history of ischemic heart disease and other diseases of the circulatory system: Secondary | ICD-10-CM

## 2023-11-24 DIAGNOSIS — O0990 Supervision of high risk pregnancy, unspecified, unspecified trimester: Secondary | ICD-10-CM

## 2023-11-24 LAB — COMPREHENSIVE METABOLIC PANEL WITH GFR
ALT: 9 U/L (ref 0–44)
AST: 26 U/L (ref 15–41)
Albumin: 2.5 g/dL — ABNORMAL LOW (ref 3.5–5.0)
Alkaline Phosphatase: 129 U/L — ABNORMAL HIGH (ref 38–126)
Anion gap: 12 (ref 5–15)
BUN: 8 mg/dL (ref 6–20)
CO2: 19 mmol/L — ABNORMAL LOW (ref 22–32)
Calcium: 9.1 mg/dL (ref 8.9–10.3)
Chloride: 104 mmol/L (ref 98–111)
Creatinine, Ser: 0.53 mg/dL (ref 0.44–1.00)
GFR, Estimated: >60 mL/min (ref >60)
Glucose, Bld: 101 mg/dL — ABNORMAL HIGH (ref 70–99)
Potassium: 4.2 mmol/L (ref 3.5–5.1)
Sodium: 135 mmol/L (ref 135–145)
Total Bilirubin: 0.9 mg/dL (ref 0.0–1.2)
Total Protein: 6.1 g/dL — ABNORMAL LOW (ref 6.5–8.1)

## 2023-11-24 LAB — CBC
HCT: 34.5 % — ABNORMAL LOW (ref 36.0–46.0)
HCT: 34.5 % — ABNORMAL LOW (ref 36.0–46.0)
Hemoglobin: 11.8 g/dL — ABNORMAL LOW (ref 12.0–15.0)
Hemoglobin: 11.6 g/dL — ABNORMAL LOW (ref 12.0–15.0)
MCH: 27.4 pg (ref 26.0–34.0)
MCH: 27.6 pg (ref 26.0–34.0)
MCHC: 34.2 g/dL (ref 30.0–36.0)
MCHC: 33.6 g/dL (ref 30.0–36.0)
MCV: 80.2 fL (ref 80.0–100.0)
MCV: 81.9 fL (ref 80.0–100.0)
Platelets: 236 K/uL (ref 150–400)
Platelets: 214 K/uL (ref 150–400)
RBC: 4.3 MIL/uL (ref 3.87–5.11)
RBC: 4.21 MIL/uL (ref 3.87–5.11)
RDW: 14.1 % (ref 11.5–15.5)
RDW: 14.3 % (ref 11.5–15.5)
WBC: 10.1 K/uL (ref 4.0–10.5)
WBC: 12.1 K/uL — ABNORMAL HIGH (ref 4.0–10.5)
nRBC: 0 % (ref 0.0–0.2)
nRBC: 0 % (ref 0.0–0.2)

## 2023-11-24 LAB — GLUCOSE, CAPILLARY
Glucose-Capillary: 107 mg/dL — ABNORMAL HIGH (ref 70–99)
Glucose-Capillary: 80 mg/dL (ref 70–99)
Glucose-Capillary: 91 mg/dL (ref 70–99)
Glucose-Capillary: 134 mg/dL — ABNORMAL HIGH (ref 70–99)

## 2023-11-24 LAB — SYPHILIS: RPR W/REFLEX TO RPR TITER AND TREPONEMAL ANTIBODIES, TRADITIONAL SCREENING AND DIAGNOSIS ALGORITHM: RPR Ser Ql: NONREACTIVE

## 2023-11-24 LAB — TYPE AND SCREEN
ABO/RH(D): A POS
Antibody Screen: NEGATIVE

## 2023-11-24 LAB — PROTEIN / CREATININE RATIO, URINE
Creatinine, Urine: 146 mg/dL
Protein Creatinine Ratio: 0.23 mg/mg{creat} — ABNORMAL HIGH (ref 0.00–0.15)
Total Protein, Urine: 34 mg/dL

## 2023-11-24 MED ORDER — DIPHENHYDRAMINE HCL 50 MG/ML IJ SOLN: 12.5000 mg | INTRAMUSCULAR | Status: DC | PRN

## 2023-11-24 MED ORDER — LABETALOL HCL 200 MG PO TABS
200.0000 mg | ORAL_TABLET | Freq: Two times a day (BID) | ORAL | Status: DC
  Administered 2023-11-24 – 2023-11-25 (×3): 200 mg via ORAL
  Filled 2023-11-24 (×3): qty 1

## 2023-11-24 MED ORDER — PHENYLEPHRINE 80 MCG/ML (10ML) SYRINGE FOR IV PUSH (FOR BLOOD PRESSURE SUPPORT): 80.0000 ug | PREFILLED_SYRINGE | INTRAVENOUS | Status: DC | PRN

## 2023-11-24 MED ORDER — PENICILLIN G POT IN DEXTROSE 60000 UNIT/ML IV SOLN
3.0000 10*6.[IU] | INTRAVENOUS | Status: DC
  Administered 2023-11-24 – 2023-11-25 (×6): 3 10*6.[IU] via INTRAVENOUS
  Filled 2023-11-24 (×6): qty 50

## 2023-11-24 MED ORDER — MISOPROSTOL 25 MCG QUARTER TABLET
25.0000 ug | ORAL_TABLET | Freq: Once | ORAL | Status: AC
  Administered 2023-11-24: 25 ug via VAGINAL
  Filled 2023-11-24: qty 1

## 2023-11-24 MED ORDER — LIDOCAINE HCL (PF) 1 % IJ SOLN: 30.0000 mL | INTRAMUSCULAR | Status: DC | PRN

## 2023-11-24 MED ORDER — OXYTOCIN BOLUS FROM INFUSION
333.0000 mL | Freq: Once | INTRAVENOUS | Status: AC
  Administered 2023-11-25: 333 mL via INTRAVENOUS

## 2023-11-24 MED ORDER — SOD CITRATE-CITRIC ACID 500-334 MG/5ML PO SOLN: 30.0000 mL | ORAL | Status: DC | PRN

## 2023-11-24 MED ORDER — LIDOCAINE HCL (PF) 1 % IJ SOLN
INTRAMUSCULAR | Status: DC | PRN
  Administered 2023-11-24: 5 mL via EPIDURAL
  Administered 2023-11-24: 2 mL via EPIDURAL
  Administered 2023-11-24: 3 mL via EPIDURAL

## 2023-11-24 MED ORDER — OXYTOCIN-SODIUM CHLORIDE 30-0.9 UT/500ML-% IV SOLN
2.5000 [IU]/h | INTRAVENOUS | Status: DC
  Administered 2023-11-25: 2.5 [IU]/h via INTRAVENOUS
  Filled 2023-11-24: qty 500

## 2023-11-24 MED ORDER — TERBUTALINE SULFATE 1 MG/ML IJ SOLN
0.2500 mg | Freq: Once | INTRAMUSCULAR | Status: DC | PRN
  Filled 2023-11-24: qty 1

## 2023-11-24 MED ORDER — EPHEDRINE 5 MG/ML INJ: 10.0000 mg | INTRAVENOUS | Status: DC | PRN

## 2023-11-24 MED ORDER — TERBUTALINE SULFATE 1 MG/ML IJ SOLN
0.2500 mg | Freq: Once | INTRAMUSCULAR | Status: AC | PRN
  Administered 2023-11-25: 0.25 mg via SUBCUTANEOUS

## 2023-11-24 MED ORDER — ONDANSETRON HCL 4 MG/2ML IJ SOLN
4.0000 mg | Freq: Four times a day (QID) | INTRAMUSCULAR | Status: DC | PRN
  Administered 2023-11-24: 4 mg via INTRAVENOUS
  Filled 2023-11-24: qty 2

## 2023-11-24 MED ORDER — MISOPROSTOL 50MCG HALF TABLET
50.0000 ug | ORAL_TABLET | Freq: Once | ORAL | Status: AC
  Administered 2023-11-24: 50 ug via ORAL
  Filled 2023-11-24: qty 1

## 2023-11-24 MED ORDER — SODIUM CHLORIDE 0.9 % IV SOLN
5.0000 10*6.[IU] | Freq: Once | INTRAVENOUS | Status: AC
  Administered 2023-11-24: 5 10*6.[IU] via INTRAVENOUS
  Filled 2023-11-24: qty 5

## 2023-11-24 MED ORDER — OXYTOCIN-SODIUM CHLORIDE 30-0.9 UT/500ML-% IV SOLN
1.0000 m[IU]/min | INTRAVENOUS | Status: DC
  Administered 2023-11-24: 2 m[IU]/min via INTRAVENOUS

## 2023-11-24 MED ORDER — LACTATED RINGERS IV SOLN
500.0000 mL | Freq: Once | INTRAVENOUS | Status: AC
  Administered 2023-11-24: 500 mL via INTRAVENOUS

## 2023-11-24 MED ORDER — FENTANYL CITRATE (PF) 100 MCG/2ML IJ SOLN: 100.0000 ug | INTRAMUSCULAR | Status: DC | PRN

## 2023-11-24 MED ORDER — FENTANYL-BUPIVACAINE-NACL 0.5-0.125-0.9 MG/250ML-% EP SOLN
12.0000 mL/h | EPIDURAL | Status: DC | PRN
  Administered 2023-11-24: 12 mL/h via EPIDURAL
  Filled 2023-11-24: qty 250

## 2023-11-24 MED ORDER — LACTATED RINGERS IV SOLN: INTRAVENOUS | Status: AC

## 2023-11-24 MED ORDER — LACTATED RINGERS IV SOLN: 500.0000 mL | INTRAVENOUS | Status: AC | PRN

## 2023-11-24 NOTE — Progress Notes (Signed)
 Patient Vitals for the past 4 hrs:  BP Temp Temp src Pulse Resp SpO2  11/24/23 2300 139/81 -- -- 86 -- 97 %  11/24/23 2230 (!) 141/81 -- -- 87 -- --  11/24/23 2204 136/86 -- -- 86 -- --  11/24/23 2200 136/86 98 F (36.7 C) Oral 87 16 99 %  11/24/23 2130 (!) 121/96 -- -- 86 -- --  11/24/23 2115 137/84 -- -- 86 -- 95 %  11/24/23 2110 136/88 -- -- 84 -- 95 %  11/24/23 2105 139/78 -- -- 82 -- 94 %  11/24/23 2100 (!) 145/73 -- -- 89 -- 96 %  11/24/23 2055 (!) 135/94 -- -- 90 -- 96 %  11/24/23 2050 139/88 -- -- 86 -- 96 %  11/24/23 2045 (!) 154/103 98.2 F (36.8 C) Oral 87 -- 100 %   Comfortable w/epidural.  Ctx q 2-3 minutes, mild. FHR Cat 1. No change in cervix:  very soft, on pt's right at 10 o'clock, anterior and angled under pubic bone.  Dr. Herchel in to attempt AROM, but just prior, she felt the umbilical cord come floating down in front of baby's head. Will not raise HOB and try different position changes/peanut ball.  Pt and family counseled about CS if cord prolapse occurs. Will increase pitocin  PRN to try to get pt into labor.

## 2023-11-24 NOTE — Progress Notes (Signed)
 Labor Progress Note BIRDIA JAYCOX is a 28 y.o. [phone] at [redacted]w[redacted]d presenting for IOL due to gHTN, poly, and A1GDM  S: Patient breathing through contractions  O:  BP (!) 143/89   Pulse 82   Temp 99.1 F (37.3 C) (Oral)   Resp 16   Ht 5' 5 (1.651 m)   Wt 124 kg   LMP  (LMP Unknown)   BMI 45.50 kg/m  Lab Results  Component Value Date   HGB 11.8 (L) 11/24/2023    Time: 12:36 PM  FHT: baseline bpm 140, moderate variability, accelerations present, decelerations none,   Contractions: q 1-3 mins,    CVE: Dilation: 1 Effacement (%): Thick Cervical Position: Posterior Station: Ballotable Presentation: Vertex Exam by:: Dr. Trudy   A&P: 28 y.o. [id number] [redacted]w[redacted]d IOL for poly, gHTN, GDMA2 #Labor: Latent Labor Cytotec  #Pain: Labor support without medications #FWB: Category I #GBS positive #gHTN: on labetalol , PEC labs normal #gDMA1: Q4hr glucose  Leeroy KATHEE Trudy, MD 12:36 PM

## 2023-11-24 NOTE — H&P (Signed)
 OBSTETRIC ADMISSION HISTORY AND PHYSICAL  Barbara [name] is a 28 y.o. female [phone] with IUP at [redacted]w[redacted]d (dated by [redacted]w[redacted]d US , Estimated Date of Delivery: 12/13/23) presenting for IOL due to gHTN, poly and A1GDM.   She reports +FMs, No LOF, no VB, no blurry vision, headaches or peripheral edema, and RUQ pain.    She plans on breast feeding. She request IUD for birth control.  She received her prenatal care at [facility]   Prenatal History/Complications:   A1GDM gHTN on labetalol  200mg  BID Polyhydramnios GBS (+) Major Depression         NURSING  PROVIDER  Office Location [facility] Dating by US  at [redacted]w[redacted]d  [facility] Model Traditional Anatomy U/S    Initiated care at  13wks                 Language  English               LAB RESULTS   Support Person Devere Dunn(grandmother) Genetics NIPS: LR female AFP:       NT/IT (FT only)        Carrier Screen Horizon:   Rhogam  A/Positive/-- (04/23 1032) A1C/GTT Early HgbA1C: 5.5 > GDM Third trimester 2 hr GTT:   Flu Vaccine No      TDaP Vaccine  Too late Blood Type A/Positive/-- (04/23 1032)  RSV Vaccine   Antibody Negative (04/23 1032)  COVID Vaccine No Rubella 1.54 (04/23 1032)  Feeding Plan breast/ pump and feed RPR Non Reactive (04/23 1032)  Contraception IUD HBsAg Negative (04/23 1032)  Circumcision Yes if female HIV Non Reactive (04/23 1032)  Pediatrician  [facility] [address] HCVAb Non Reactive (04/23 1032)  Prenatal Classes        BTL Consent   Pap       Diagnosis  Date Value Ref Range Status  06/09/2023     Final    - Negative for Intraepithelial Lesions or Malignancy (NILM)  06/09/2023 - Benign reactive/reparative changes   Final    BTL Pre-payment   GC/CT Initial:   36wks:    VBAC Consent   GBS For PCN allergy, check sensitivities   BRx Optimized? [ ]  yes   [X]  no      DME Rx [X]  BP cuff [ ]  Weight Scale Waterbirth  [ ]  Class [ ]  Consent [ ]  CNM visit  PHQ9 & GAD7 [X]  new OB [  ] 28 weeks  [  ] 36 weeks Induction  [ ]  Orders Entered  [ ] Foley Y/N     Past Medical History: 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, also been evaluated by neurology   Sleep disturbances 04/26/2021   UTI (urinary tract infection)     Past Surgical History: Past Surgical History:  Procedure Laterality Date   NO PAST SURGERIES      Obstetrical History: OB History     Gravida  4   Para  2   Term  2   Preterm  0   AB  1   Living  2      SAB  1   IAB  0   Ectopic  0  Multiple  0   Live Births  2           Social History Social History   Socioeconomic History   Marital status: Legally Separated    Spouse name: Not on file   Number of children: 1   Years of education: 11   Highest education level: Not on file  Occupational History   Occupation: unemployed  Tobacco Use   Smoking status: Never   Smokeless tobacco: Never  Vaping Use   Vaping status: Never Used  Substance and Sexual Activity   Alcohol use: No    Alcohol/week: 0.0 standard drinks of alcohol   Drug use: No   Sexual activity: Not Currently    Partners: Male  Other Topics Concern   Not on file  Social History Narrative   Patient does not drink caffeine .   Patient is right handed.   Social Drivers of Corporate investment banker Strain: Low Risk  (05/21/2023)   Received from [facility]   Overall Financial Resource Strain (CARDIA)    Difficulty of Paying Living Expenses: Not hard at all  Food Insecurity: Food Insecurity Present (11/24/2023)   Hunger Vital Sign    Worried About Running Out of Food in the Last Year: Sometimes true    Ran Out of Food in the Last Year: Sometimes true  Transportation Needs: No Transportation Needs (11/24/2023)   PRAPARE - Doctor, general practice (Medical): No    Lack of Transportation (Non-Medical): No  Physical Activity: Sufficiently Active (11/12/2022)   Received from [facility]   Exercise Vital Sign    On average, how many days per week do you engage in moderate to strenuous exercise (like a brisk walk)?: 5 days    On average, how many minutes do you engage in exercise at this level?: 30 min  Stress: Stress Concern Present (11/12/2022)   Received from [facility] - Occupational Stress Questionnaire    Feeling of Stress : Very much  Social Connections: Somewhat Isolated (11/12/2022)   Received from [facility]   Social Network    How would you rate your social network (family, work, friends)?: Restricted participation with some degree of social isolation    Family History: Family History  Problem Relation Age of Onset   Depression Mother    Cleft palate Mother    Schizophrenia Father    Healthy Sister    Healthy Brother    Schizophrenia Maternal Uncle    Hypertension Maternal Grandmother    Depression Maternal Grandmother    Asthma Maternal Grandmother    Diabetes Maternal Grandmother    Heart disease Maternal Grandmother    Hearing loss Maternal Grandfather    COPD Maternal Grandfather     Allergies: Allergies  Allergen Reactions   Corn Fiber Anaphylaxis and Swelling    Pt states that it is the silk that she is allergic to. Can eat can corn. Cannot shuck and touch skin and silk.   Percocet [Oxycodone -Acetaminophen ] Anaphylaxis and Other (See Comments)    States feels like chest is tightening up.   Other Itching and Nausea And Vomiting    Sherbert    Medications Prior to Admission  Medication Sig Dispense Refill Last Dose/Taking   buPROPion  (WELLBUTRIN  XL) 300 MG 24 hr tablet Take 1 tablet (300 mg total) by mouth daily. 30 tablet 5 11/23/2023   famotidine  (PEPCID ) 20 MG tablet Take 1 tablet (20 mg total) by  mouth 2 (two) times daily. 60 tablet  5 11/23/2023   labetalol  (NORMODYNE ) 200 MG tablet Take 1 tablet (200 mg total) by mouth 3 (three) times daily. 90 tablet 0 11/23/2023 Morning   NIFEdipine  (PROCARDIA ) 10 MG capsule Take 1 capsule (10 mg total) by mouth every 8 (eight) hours as needed (contraction pain). 30 capsule 0 11/23/2023 Morning   ondansetron  (ZOFRAN -ODT) 4 MG disintegrating tablet Take 1 tablet (4 mg total) by mouth every 8 (eight) hours as needed for nausea or vomiting. 15 tablet 0 11/23/2023 Evening   Prenatal Vit-Fe Fumarate-FA (MULTIVITAMIN-PRENATAL) 27-0.8 MG TABS tablet Take 1 tablet by mouth daily.   11/23/2023   sertraline  (ZOLOFT ) 50 MG tablet Take 1 tablet (50 mg total) by mouth daily. 90 tablet 3 11/23/2023 Morning   traZODone  (DESYREL ) 50 MG tablet Take 2 tablets (100 mg total) by mouth at bedtime. 30 tablet 1 Past Week   Accu-Chek Softclix Lancets lancets Check blood sugars 4 times daily; once when waking up, and 2 hours after each meal. (Patient not taking: Reported on 11/12/2023) 100 each 12    aspirin  EC 81 MG tablet Take 1 tablet (81 mg total) by mouth daily. Swallow whole. (Patient not taking: Reported on 11/18/2023) 30 tablet 4    Blood Glucose Monitoring Suppl (ACCU-CHEK GUIDE) w/Device KIT 1 Device by Does not apply route 4 (four) times daily. (Patient not taking: Reported on 11/12/2023) 1 kit 0    Blood Pressure Monitoring (BLOOD PRESSURE KIT) DEVI 1 Device by Does not apply route once a week. 1 each 0    glucose blood test strip Check blood sugars 4 times daily; once when waking up, and 2 hours after each meal. 100 each 12    metroNIDAZOLE  (METROGEL ) 0.75 % vaginal gel Place 1 Applicatorful vaginally at bedtime. Apply one applicatorful to vagina at bedtime for 5 days 70 g 0      Review of Systems  All systems reviewed and negative except as stated in HPI.  Height 5' 5 (1.651 m), weight 124 kg, unknown if currently breastfeeding. General appearance: alert, cooperative, and appears stated age Lungs:  breathing comfortably on room air Heart: regular rate Abdomen: soft, non-tender; gravid Extremities: no edema of bilateral lower extremities DTR's intact Presentation: cephalic Fetal monitoringBaseline: 135 bpm, Variability: Good {> 6 bpm), Accelerations: Reactive, and Decelerations: Absent Uterine activity: Irregular     Prenatal labs: ABO, Rh: A/Positive/-- (04/23 1032) Antibody: Negative (04/23 1032) Rubella: 1.54 (04/23 1032) RPR: Non Reactive (06/26 0820)  HBsAg: Negative (04/23 1032)  HIV: Non Reactive (06/26 0820)  GBS: Positive/-- (10/01 1059)  2 hr Glucola abnormal Genetic screening  LR female Anatomy US  appears normal Last US : At [redacted]w[redacted]d - cephalic presentation, EFW 2729g (79 %tile), AC 98%  Prenatal Transfer Tool  Maternal Diabetes: Yes:  Diabetes Type:  Diet controlled Genetic Screening: Normal Maternal Ultrasounds/Referrals: Normal Fetal Ultrasounds or other Referrals:  None Maternal Substance Abuse:  No Significant Maternal Medications:  Meds include: Other: bupropion , labetalol , zoloft , trazodone . Significant Maternal Lab Results:  Group B Strep positive Number of Prenatal Visits:greater than 3 verified prenatal visits Other Comments:  None  No results found for this or any previous visit (from the past 24 hours).  Patient Active Problem List   Diagnosis Date Noted   Gestational hypertension 11/24/2023   Polyhydramnios affecting pregnancy 11/19/2023   Group B Streptococcus carrier, +RV culture, currently pregnant 11/16/2023   Maternal obesity affecting pregnancy, antepartum 11/03/2023   Gestational hypertension, antepartum 11/03/2023   Supervision  of high risk pregnancy, antepartum 06/04/2023   Mild episode of recurrent major depressive disorder 04/26/2021   Anxiety state 04/26/2021   Severe recurrent major depression without psychotic features (HCC) 02/28/2021   BMI 30s 12/28/2020   History of gestational hypertension 05/01/2017   GDM (gestational  diabetes mellitus) 03/14/2017   Family history of cleft palate 02/05/2017   Narcolepsy 09/19/2016    Assessment/Plan:  Barbara [name] is a 28 y.o. [id number] at [redacted]w[redacted]d here for IOL due to A1GDM, gHTN, and polyhydramnios.  #Labor: IOL process explained in detail to the patient. Will start with dual cytotec.  #Pain: Per patient request #FWB: Cat I #ID:  GBS (+) -> PCN #MOF: Breast #MOC: cuIUD #Circ:  N/A #gHTN: Will continue to monitor BP's #A1GDM: q4h glucose checks in latent labor, q2h glucose checks in active labor  Barkley Angles, MD OB Fellow, Faculty Practice [facility] 11/24/2023 6:35 AM

## 2023-11-24 NOTE — Progress Notes (Signed)
 Patient Vitals for the past 4 hrs:  BP Temp Temp src Pulse  11/24/23 1855 (!) 138/94 -- -- 85  11/24/23 1854 -- 98.2 F (36.8 C) Oral --  11/24/23 1754 (!) 146/88 -- -- 89  11/24/23 1626 130/81 -- -- 86   Ctx apin 7/10. Balloon out. Cx ~ 4/50/ballottable.  Vtx confirmed w/US .  FHR Cat 1.  Will start pitocin  Plans epidural.

## 2023-11-24 NOTE — Anesthesia Preprocedure Evaluation (Signed)
 Anesthesia Evaluation  Patient identified by MRN, date of birth, ID band Patient awake    Reviewed: Allergy & Precautions, NPO status , Patient's Chart, lab work & pertinent test results  Airway Mallampati: II  TM Distance: >3 FB Neck ROM: Full    Dental  (+) Dental Advisory Given, Poor Dentition, Chipped,    Pulmonary neg pulmonary ROS   Pulmonary exam normal breath sounds clear to auscultation       Cardiovascular hypertension, Pt. on medications Normal cardiovascular exam Rhythm:Regular Rate:Normal     Neuro/Psych  PSYCHIATRIC DISORDERS Anxiety Depression    Narcolepsy with cataplexy    GI/Hepatic negative GI ROS, Neg liver ROS,,,  Endo/Other  diabetes, Well Controlled, Gestational  Class 3 obesity  Renal/GU negative Renal ROS     Musculoskeletal negative musculoskeletal ROS (+)    Abdominal   Peds  Hematology  (+) Blood dyscrasia, anemia Plt 214k   Anesthesia Other Findings Day of surgery medications reviewed with the patient.  Reproductive/Obstetrics (+) Pregnancy                              Anesthesia Physical Anesthesia Plan  ASA: 3  Anesthesia Plan: Epidural   Post-op Pain Management:    Induction:   PONV Risk Score and Plan: 2 and Treatment may vary due to age or medical condition  Airway Management Planned: Natural Airway  Additional Equipment:   Intra-op Plan:   Post-operative Plan:   Informed Consent: I have reviewed the patients History and Physical, chart, labs and discussed the procedure including the risks, benefits and alternatives for the proposed anesthesia with the patient or authorized representative who has indicated his/her understanding and acceptance.     Dental advisory given  Plan Discussed with:   Anesthesia Plan Comments: (Patient identified. Risks/Benefits/Options discussed with patient including but not limited to bleeding, infection,  nerve damage, paralysis, failed block, incomplete pain control, headache, blood pressure changes, nausea, vomiting, reactions to medication both or allergic, itching and postpartum back pain. Confirmed with bedside nurse the patient's most recent platelet count. Confirmed with patient that they are not currently taking any anticoagulation, have any bleeding history or any family history of bleeding disorders. Patient expressed understanding and wished to proceed. All questions were answered. )         Anesthesia Quick Evaluation

## 2023-11-24 NOTE — Anesthesia Procedure Notes (Signed)
 Epidural Patient location during procedure: OB Start time: 11/24/2023 8:33 PM End time: 11/24/2023 8:40 PM  Staffing Anesthesiologist: Corinne Garnette [name], MD Performed: anesthesiologist   Preanesthetic Checklist Completed: patient identified, IV checked, risks and benefits discussed, monitors and equipment checked, pre-op evaluation and timeout performed  Epidural Patient position: sitting Prep: DuraPrep Patient monitoring: blood pressure and continuous pulse ox Approach: midline Location: L3-L4 Injection technique: LOR air  Needle:  Needle type: Tuohy  Needle gauge: 17 G Needle length: 9 cm Needle insertion depth: 7 cm Catheter size: 19 Gauge Catheter at skin depth: 12 cm Test dose: negative and Other (1% Lidocaine )  Additional Notes Patient identified.  Risk benefits discussed including failed block, incomplete pain control, headache, nerve damage, paralysis, blood pressure changes, nausea, vomiting, reactions to medication both toxic or allergic, and postpartum back pain.  Patient expressed understanding and wished to proceed.  All questions were answered.  Sterile technique used throughout procedure and epidural site dressed with sterile barrier dressing. No paresthesia or other complications noted. The patient did not experience any signs of intravascular injection such as tinnitus or metallic taste in mouth nor signs of intrathecal spread such as rapid motor block. Please see nursing notes for vital signs. Reason for block:procedure for pain

## 2023-11-25 ENCOUNTER — Encounter (HOSPITAL_COMMUNITY): Payer: Self-pay | Admitting: Obstetrics and Gynecology

## 2023-11-25 ENCOUNTER — Ambulatory Visit

## 2023-11-25 DIAGNOSIS — O134 Gestational [pregnancy-induced] hypertension without significant proteinuria, complicating childbirth: Secondary | ICD-10-CM

## 2023-11-25 DIAGNOSIS — O99820 Streptococcus B carrier state complicating pregnancy: Secondary | ICD-10-CM

## 2023-11-25 DIAGNOSIS — O24420 Gestational diabetes mellitus in childbirth, diet controlled: Secondary | ICD-10-CM

## 2023-11-25 DIAGNOSIS — O403XX0 Polyhydramnios, third trimester, not applicable or unspecified: Secondary | ICD-10-CM

## 2023-11-25 DIAGNOSIS — Z3A37 37 weeks gestation of pregnancy: Secondary | ICD-10-CM

## 2023-11-25 LAB — GLUCOSE, CAPILLARY
Glucose-Capillary: 69 mg/dL — ABNORMAL LOW (ref 70–99)
Glucose-Capillary: 59 mg/dL — ABNORMAL LOW (ref 70–99)
Glucose-Capillary: 87 mg/dL (ref 70–99)
Glucose-Capillary: 81 mg/dL (ref 70–99)
Glucose-Capillary: 91 mg/dL (ref 70–99)

## 2023-11-25 LAB — CBC
HCT: 33.7 % — ABNORMAL LOW (ref 36.0–46.0)
Hemoglobin: 11.6 g/dL — ABNORMAL LOW (ref 12.0–15.0)
MCH: 27.3 pg (ref 26.0–34.0)
MCHC: 34.4 g/dL (ref 30.0–36.0)
MCV: 79.3 fL — ABNORMAL LOW (ref 80.0–100.0)
Platelets: 202 K/uL (ref 150–400)
RBC: 4.25 MIL/uL (ref 3.87–5.11)
RDW: 14.1 % (ref 11.5–15.5)
WBC: 12.6 K/uL — ABNORMAL HIGH (ref 4.0–10.5)
nRBC: 0 % (ref 0.0–0.2)

## 2023-11-25 MED ORDER — LACTATED RINGERS AMNIOINFUSION: INTRAVENOUS | Status: DC

## 2023-11-25 MED ORDER — NIFEDIPINE ER OSMOTIC RELEASE 30 MG PO TB24
30.0000 mg | ORAL_TABLET | Freq: Once | ORAL | Status: AC
  Administered 2023-11-25: 30 mg via ORAL
  Filled 2023-11-25: qty 1

## 2023-11-25 MED ORDER — SIMETHICONE 80 MG PO CHEW: 80.0000 mg | CHEWABLE_TABLET | ORAL | Status: DC | PRN

## 2023-11-25 MED ORDER — LACTATED RINGERS IV SOLN: INTRAVENOUS | Status: DC

## 2023-11-25 MED ORDER — DIBUCAINE (PERIANAL) 1 % EX OINT: 1.0000 | TOPICAL_OINTMENT | CUTANEOUS | Status: DC | PRN

## 2023-11-25 MED ORDER — COCONUT OIL OIL: 1.0000 | TOPICAL_OIL | Status: DC | PRN

## 2023-11-25 MED ORDER — FUROSEMIDE 20 MG PO TABS
20.0000 mg | ORAL_TABLET | Freq: Every day | ORAL | Status: DC
  Administered 2023-11-25 – 2023-11-26 (×2): 20 mg via ORAL
  Filled 2023-11-25 (×2): qty 1

## 2023-11-25 MED ORDER — DIPHENHYDRAMINE HCL 25 MG PO CAPS: 25.0000 mg | ORAL_CAPSULE | Freq: Four times a day (QID) | ORAL | Status: DC | PRN

## 2023-11-25 MED ORDER — SERTRALINE HCL 50 MG PO TABS
50.0000 mg | ORAL_TABLET | Freq: Every day | ORAL | Status: DC
  Administered 2023-11-25 – 2023-11-26 (×2): 50 mg via ORAL
  Filled 2023-11-25 (×2): qty 1

## 2023-11-25 MED ORDER — NIFEDIPINE ER OSMOTIC RELEASE 30 MG PO TB24
30.0000 mg | ORAL_TABLET | Freq: Every day | ORAL | Status: DC
  Administered 2023-11-25: 30 mg via ORAL
  Filled 2023-11-25: qty 1

## 2023-11-25 MED ORDER — WITCH HAZEL-GLYCERIN EX PADS: 1.0000 | MEDICATED_PAD | CUTANEOUS | Status: DC | PRN

## 2023-11-25 MED ORDER — ONDANSETRON HCL 4 MG/2ML IJ SOLN: 4.0000 mg | INTRAMUSCULAR | Status: DC | PRN

## 2023-11-25 MED ORDER — PRENATAL MULTIVITAMIN CH
1.0000 | ORAL_TABLET | Freq: Every day | ORAL | Status: DC
  Administered 2023-11-25 – 2023-11-26 (×2): 1 via ORAL
  Filled 2023-11-25 (×2): qty 1

## 2023-11-25 MED ORDER — SENNOSIDES-DOCUSATE SODIUM 8.6-50 MG PO TABS
2.0000 | ORAL_TABLET | Freq: Every day | ORAL | Status: DC
  Administered 2023-11-26: 2 via ORAL
  Filled 2023-11-25: qty 2

## 2023-11-25 MED ORDER — POTASSIUM CHLORIDE CRYS ER 20 MEQ PO TBCR
20.0000 meq | EXTENDED_RELEASE_TABLET | Freq: Every day | ORAL | Status: DC
  Administered 2023-11-25 – 2023-11-26 (×2): 20 meq via ORAL
  Filled 2023-11-25 (×2): qty 1

## 2023-11-25 MED ORDER — BENZOCAINE-MENTHOL 20-0.5 % EX AERO: 1.0000 | INHALATION_SPRAY | CUTANEOUS | Status: DC | PRN

## 2023-11-25 MED ORDER — NIFEDIPINE ER OSMOTIC RELEASE 30 MG PO TB24
60.0000 mg | ORAL_TABLET | Freq: Every day | ORAL | Status: DC
  Administered 2023-11-26: 60 mg via ORAL
  Filled 2023-11-25: qty 2

## 2023-11-25 MED ORDER — BUPROPION HCL ER (XL) 300 MG PO TB24
300.0000 mg | ORAL_TABLET | Freq: Every day | ORAL | Status: DC
  Administered 2023-11-25 – 2023-11-26 (×2): 300 mg via ORAL
  Filled 2023-11-25 (×2): qty 1

## 2023-11-25 MED ORDER — IBUPROFEN 600 MG PO TABS
600.0000 mg | ORAL_TABLET | Freq: Four times a day (QID) | ORAL | Status: DC
Start: 2023-11-25 — End: 2023-11-26
  Administered 2023-11-25 – 2023-11-26 (×4): 600 mg via ORAL
  Filled 2023-11-25 (×4): qty 1

## 2023-11-25 MED ORDER — TETANUS-DIPHTH-ACELL PERTUSSIS 5-2-15.5 LF-MCG/0.5 IM SUSP: 0.5000 mL | Freq: Once | INTRAMUSCULAR | Status: DC

## 2023-11-25 MED ORDER — ZOLPIDEM TARTRATE 5 MG PO TABS: 5.0000 mg | ORAL_TABLET | Freq: Every evening | ORAL | Status: DC | PRN

## 2023-11-25 MED ORDER — ONDANSETRON HCL 4 MG PO TABS: 4.0000 mg | ORAL_TABLET | ORAL | Status: DC | PRN

## 2023-11-25 MED ORDER — ACETAMINOPHEN 325 MG PO TABS
650.0000 mg | ORAL_TABLET | ORAL | Status: DC | PRN
  Administered 2023-11-25 – 2023-11-26 (×2): 650 mg via ORAL
  Filled 2023-11-25 (×2): qty 2

## 2023-11-25 MED ORDER — LACTATED RINGERS IV SOLN
500.0000 mL | INTRAVENOUS | Status: DC | PRN
  Administered 2023-11-25: 500 mL via INTRAVENOUS

## 2023-11-25 NOTE — Lactation Note (Addendum)
 This note was copied from a baby's chart. Lactation Consultation Note  Patient Name: Barbara [name] Date: 11/25/2023 Age:29 hours Reason for consult: Initial assessment;Exclusive pumping and bottle feeding;Early term 37-38.6wks, See MOB: MR- hx of A1GDM and GHTN  P3, MOB feeding plan is to Pump onlyand formula feed infant.   Infant having low blood sugars infant was given 15 mls of 20 kcal formula that was pace feed, afterwards FOB was doing skin to skin with infant while MOB was shown how to use the DEBP. MOB fitted with 18 mm breast flanges and was expressing colostrum in flanges as LC left the room. MOB knows that her EBM is safe at room temperature for 4 hours whereas formula RTF once open is only safe for 1 hour. MOB will continue to feed infant by cues, 8-12 times within 24 hours. Due infant currently having low blood sugars, family will do as much skin to skin with infant as possible. MOB was  made aware of O/P services, breastfeeding support groups, community resources, and our phone # for post-discharge questions.    LC sent referral for STORK DEBP today    Maternal Data Has patient been taught Hand Expression?: Yes Does the patient have breastfeeding experience prior to this delivery?: Yes How long did the patient breastfeed?: Per MOB, she did not latch previous children to breast until she was discharged, she mostly only pumped, 1st child for 3 months and 2nd child for 2 months.  Feeding Mother's Current Feeding Choice: Breast Milk and Formula Nipple Type: Slow - flow  LATCH Score  PUMPING Only and formula feeding infant.                   Lactation Tools Discussed/Used Tools: Pump;Flanges Flange Size: 18 Breast pump type: Double-Electric Breast Pump Pump Education: Setup, frequency, and cleaning;Milk Storage Reason for Pumping: MOB was to exclussively Pump only and formula Pumping frequency: MOB will continue to pump every 3 hours for 15 minutes  on inital setting.  Interventions Interventions: Breast feeding basics reviewed;Skin to skin;Education;Pace feeding;Guidelines for Milk Supply and Pumping Schedule Handout;LC Services brochure;CDC milk storage guidelines;CDC Guidelines for Breast Pump Cleaning  Discharge Pump: Referral sent for [facility] Pump  Consult Status Consult Status: Follow-up Date: 11/26/23 Follow-up type: In-patient    Grayce LULLA Batter 11/25/2023, 4:34 PM

## 2023-11-25 NOTE — Progress Notes (Signed)
 Labor Progress Note Barbara [name] is a 28 y.o. [phone] at [redacted]w[redacted]d presented for IOL for gHTN, GDMA1, and polyhydramnios  S:  Comfortable with epidural  O:  BP (!) 157/80   Pulse 90   Temp 97.9 F (36.6 C) (Oral)   Resp 16   Ht 5' 5 (1.651 m)   Wt 124 kg   LMP  (LMP Unknown)   SpO2 100%   BMI 45.50 kg/m  EFM: baseline 120 bpm/ moderate variability/ + accels/ recurrent variable decels  Toco/IUPC: ctx q74min SVE: Dilation: 9 Effacement (%): 80 Cervical Position: Middle Station: 0 Presentation: Vertex Exam by:: Melanie Soto-Perez RN/Olivia Cox RN Pitocin : 14 mu/min  A/P: 28 y.o. [id number] [redacted]w[redacted]d in active labor 1. Labor: Progressing with augmentation 2. Pain: Epidural 3. GBS positive, has received PCN ppx  4. gHTN: continues with mild to severe pressures, severe range pressures have not been sustained 5. GDMA1: Most recent blood sugar 81  - Anticipate SVD.  Barbara [name], MD 10:21 AM

## 2023-11-25 NOTE — Progress Notes (Signed)
 Patient Vitals for the past 4 hrs:  BP Temp Temp src Pulse Resp SpO2  11/25/23 0530 136/83 -- -- (!) 105 -- 95 %  11/25/23 0513 132/87 -- -- 88 -- --  11/25/23 0500 (!) 162/105 97.6 F (36.4 C) Oral 92 16 99 %  11/25/23 0452 -- -- -- -- -- 98 %  11/25/23 0430 (!) 146/86 -- -- 87 -- 100 %  11/25/23 0400 (!) 142/80 -- -- 91 -- 98 %  11/25/23 0330 (!) 143/75 -- -- 88 -- 99 %  11/25/23 0300 137/65 -- -- 88 -- 96 %  11/25/23 0230 (!) 150/73 -- -- 89 -- 97 %   Comfortable w/epidural. Blood sugars69, 59 and now 87.  FHR 125, mod variability, + accels, no decels.  Ctx q 1-2 minutes, pitocin  at 14 mu/min.  Cx more midline, no palpable cord, vtx not well applied.  4/a60/ballottable.  Will continue to titrate up pitocin  and reevaluate for AROM later today.

## 2023-11-25 NOTE — Discharge Summary (Signed)
 Postpartum Discharge Summary  Date of Service updated***     Patient Name: Barbara [name] DOB: 08/08/1995 MRN: [id number]  Date of admission: 11/24/2023 Delivery date:11/25/2023 Delivering provider: JOMARIE CAMPI A Date of discharge: 11/25/2023  Admitting diagnosis: Gestational hypertension [O13.9] Intrauterine pregnancy: [redacted]w[redacted]d     Secondary diagnosis:  Principal Problem:   Gestational hypertension  Additional problems: ***    Discharge diagnosis: Term Pregnancy Delivered, Gestational Hypertension, and GDM A1                                              Post partum procedures:{Postpartum procedures:23558} Augmentation: Pitocin , Cytotec, and IP Foley Complications: None  Hospital course: Induction of Labor With Vaginal Delivery   28 y.o. yo Barbara [name] at [redacted]w[redacted]d was admitted to the hospital 11/24/2023 for induction of labor.  Indication for induction: Gestational hypertension and A1 DM.  Patient had an labor course complicated by polyhydramnios Membrane Rupture Time/Date: 7:15 AM,11/25/2023  Delivery Method:Vaginal, Spontaneous Operative Delivery:N/A Episiotomy: None Lacerations:  None Details of delivery can be found in separate delivery note.  Patient had a postpartum course complicated by***. Patient is discharged home 11/25/23.  Newborn Data: Birth date:11/25/2023 Birth time:11:35 AM Gender:Female Living status:Living Apgars:7 ,9  Weight:   Magnesium  Sulfate received: {Mag received:30440022} BMZ received: No Rhophylac:N/A MMR:N/A T-DaP:{Tdap:23962}offer PP Flu: No RSV Vaccine received: No Transfusion:{Transfusion received:30440034}  Immunizations received: Immunization History  Administered Date(s) Administered   Influenza Split 11/05/2012, 12/29/2012   Influenza,inj,Quad PF,6+ Mos 02/05/2017   Influenza,trivalent, recombinat, inj, PF 11/05/2012, 12/29/2012   Tdap 12/29/2012, 05/04/2017    Physical exam  Vitals:   11/25/23 1011 11/25/23 1030  11/25/23 1100 11/25/23 1132  BP:  137/84 (!) 143/90 (!) 145/95  Pulse:  98 90 91  Resp:      Temp:      TempSrc:      SpO2: 100% 98% 97%   Weight:      Height:       General: {Exam; general:21111117} Lochia: {Desc; appropriate/inappropriate:30686::appropriate} Uterine Fundus: {Desc; firm/soft:30687} Incision: {Exam; incision:21111123} DVT Evaluation: {Exam; dvt:2111122} Labs: Lab Results  Component Value Date   WBC 12.1 (H) 11/24/2023   HGB 11.6 (L) 11/24/2023   HCT 34.5 (L) 11/24/2023   MCV 81.9 11/24/2023   PLT 214 11/24/2023      Latest Ref Rng & Units 11/24/2023    6:30 AM  CMP  Glucose 70 - 99 mg/dL 898   BUN 6 - 20 mg/dL 8   Creatinine 9.55 - 8.99 mg/dL 9.46   Sodium 864 - 854 mmol/L 135   Potassium 3.5 - 5.1 mmol/L 4.2   Chloride 98 - 111 mmol/L 104   CO2 22 - 32 mmol/L 19   Calcium 8.9 - 10.3 mg/dL 9.1   Total Protein 6.5 - 8.1 g/dL 6.1   Total Bilirubin 0.0 - 1.2 mg/dL 0.9   Alkaline Phos 38 - 126 U/L 129   AST 15 - 41 U/L 26   ALT 0 - 44 U/L 9    Edinburgh Score:    06/25/2017   10:05 AM  Edinburgh Postnatal Depression Scale Screening Tool  I have been able to laugh and see the funny side of things. 0  I have looked forward with enjoyment to things. 0  I have blamed myself unnecessarily when things went wrong. 0  I have been anxious or worried  for no good reason. 0  I have felt scared or panicky for no good reason. 0  Things have been getting on top of me. 0  I have been so unhappy that I have had difficulty sleeping. 0  I have felt sad or miserable. 0  I have been so unhappy that I have been crying. 0  The thought of harming myself has occurred to me. 0  Edinburgh Postnatal Depression Scale Total 0      Data saved with a previous flowsheet row definition   No data recorded  After visit meds:  Allergies as of 11/25/2023       Reactions   Percocet [oxycodone -acetaminophen ] Anaphylaxis, Other (See Comments)   States feels like chest is  tightening up.   Other Itching, Nausea And Vomiting   Sherbert     Med Rec must be completed prior to using this [facility] [place]***        Discharge home in stable condition Infant Feeding: Bottle and Breast Infant Disposition:home with mother Discharge instruction: per After Visit Summary and Postpartum booklet. Activity: Advance as tolerated. Pelvic rest for 6 weeks.  Diet: routine diet Future Appointments:No future appointments. Follow up Visit:   Please schedule this patient for a In person postpartum visit in 6 weeks with the following provider: Any provider. Additional Postpartum F/U:2 hour GTT and BP check 1 week  High risk pregnancy complicated by: GDM and HTN Delivery mode:  Vaginal, Spontaneous Anticipated Birth Control:  copper IUD  Message sent to [facility] 10/14  11/25/2023 Barbara [name], MD

## 2023-11-26 ENCOUNTER — Other Ambulatory Visit (HOSPITAL_COMMUNITY): Payer: Self-pay

## 2023-11-26 LAB — CBC
HCT: 31.1 % — ABNORMAL LOW (ref 36.0–46.0)
Hemoglobin: 10.6 g/dL — ABNORMAL LOW (ref 12.0–15.0)
MCH: 27.5 pg (ref 26.0–34.0)
MCHC: 34.1 g/dL (ref 30.0–36.0)
MCV: 80.6 fL (ref 80.0–100.0)
Platelets: 194 K/uL (ref 150–400)
RBC: 3.86 MIL/uL — ABNORMAL LOW (ref 3.87–5.11)
RDW: 13.9 % (ref 11.5–15.5)
WBC: 10.2 K/uL (ref 4.0–10.5)
nRBC: 0 % (ref 0.0–0.2)

## 2023-11-26 LAB — GLUCOSE, RANDOM: Glucose, Bld: 91 mg/dL (ref 70–99)

## 2023-11-26 MED ORDER — NIFEDIPINE ER 60 MG PO TB24
60.0000 mg | ORAL_TABLET | Freq: Every day | ORAL | 0 refills | Status: DC
  Filled 2023-11-26: qty 60, 60d supply, fill #0

## 2023-11-26 MED ORDER — IBUPROFEN 600 MG PO TABS
600.0000 mg | ORAL_TABLET | Freq: Four times a day (QID) | ORAL | 0 refills | Status: DC | PRN
  Filled 2023-11-26: qty 30, 8d supply, fill #0

## 2023-11-26 MED ORDER — SENNOSIDES-DOCUSATE SODIUM 8.6-50 MG PO TABS
2.0000 | ORAL_TABLET | Freq: Every evening | ORAL | 0 refills | Status: AC | PRN
  Filled 2023-11-26: qty 30, 15d supply, fill #0

## 2023-11-26 MED ORDER — POTASSIUM CHLORIDE CRYS ER 20 MEQ PO TBCR
20.0000 meq | EXTENDED_RELEASE_TABLET | Freq: Every day | ORAL | 0 refills | Status: DC
  Filled 2023-11-26: qty 4, 4d supply, fill #0

## 2023-11-26 MED ORDER — ACETAMINOPHEN 500 MG PO TABS
500.0000 mg | ORAL_TABLET | Freq: Four times a day (QID) | ORAL | 0 refills | Status: AC | PRN
  Filled 2023-11-26: qty 30, 8d supply, fill #0

## 2023-11-26 MED ORDER — FUROSEMIDE 20 MG PO TABS
20.0000 mg | ORAL_TABLET | Freq: Every day | ORAL | 0 refills | Status: DC
  Filled 2023-11-26: qty 4, 4d supply, fill #0

## 2023-11-26 NOTE — Patient Instructions (Signed)
 If interested in an outpatient lactation consult in office or virtually please reach out to us  at [facility] (First Floor) [address] [place] Please feel free to out with any lactation related questions or concerns [phone]  to leave a message for our lactation voicemail box.  Lactation support groups:  [facility], Tuesdays 10:00 am -12:00 pm at [address] on the second floor in the conference room, lactating parents and lap babies welcome.  [email or web address]  [email or web address]      Geraldina Louder, [facility] for [facility]

## 2023-11-26 NOTE — Anesthesia Postprocedure Evaluation (Signed)
 Anesthesia Post Note  Patient: Barbara [name]  Procedure(s) Performed: AN AD HOC LABOR EPIDURAL     Patient location during evaluation: Mother Baby Anesthesia Type: Epidural Level of consciousness: awake and alert Pain management: pain level controlled Vital Signs Assessment: post-procedure vital signs reviewed and stable Respiratory status: spontaneous breathing, nonlabored ventilation and respiratory function stable Cardiovascular status: stable Postop Assessment: no headache, no backache and epidural receding Anesthetic complications: no   No notable events documented.  Last Vitals:  Vitals:   11/25/23 2153 11/26/23 0546  BP: (!) 145/98 126/83  Pulse:  95  Resp:  17  Temp:  36.7 C  SpO2:  100%    Last Pain:  Vitals:   11/26/23 0546  TempSrc:   PainSc: 8    Pain Goal:                   [place] Riesen

## 2023-11-27 NOTE — Social Work (Signed)
 CSW received consult for hx of Anxiety and Depression.  CSW met with MOB to offer support and complete assessment.  CSW entered the room and observed MOB resting in bed holding the infant. CSW introduced self, CSW role and reason for visit. MOB was agreeable to visit. CSW inquired about how MOB was feeling, MOB reported god CSW inquired about MOB MH hx, MOB reported she was diagnosed with depression at 28 years old. MOB reported she currently takes Wellbutrin  and Zoloft . MOB reported the medication has been beneficial for her mood. CSW assessed for safety, MOB denied any SI or HI. CSW provided education regarding the baby blues period vs. perinatal mood disorders, discussed treatment and gave resources for mental health follow up if concerns arise.  CSW recommends self-evaluation during the postpartum time period using the New Mom Checklist from Postpartum Progress and encouraged MOB to contact a medical professional if symptoms are noted at any time. MOB identified her grandmother as her primary support.     CSW provided review of Sudden Infant Death Syndrome (SIDS) precautions.  MOB reported she has all essential items for  the infant including  a pack n play and car seat.  CSW identifies no further need for intervention and no barriers to discharge at this time.  Aneudy Champlain, LCSWA Clinical Social Worker [phone]

## 2023-12-02 ENCOUNTER — Ambulatory Visit

## 2023-12-02 ENCOUNTER — Telehealth (HOSPITAL_COMMUNITY): Payer: Self-pay | Admitting: *Deleted

## 2023-12-02 NOTE — Telephone Encounter (Signed)
 Attempted hospital discharge follow-up call. Message received stating voicemail not set up. Allean IVAR Carton, RN, 12/02/23, [phone]

## 2023-12-03 ENCOUNTER — Ambulatory Visit

## 2023-12-17 ENCOUNTER — Encounter: Payer: Self-pay | Admitting: Pharmacist

## 2023-12-17 ENCOUNTER — Other Ambulatory Visit: Payer: Self-pay

## 2023-12-17 ENCOUNTER — Other Ambulatory Visit (HOSPITAL_BASED_OUTPATIENT_CLINIC_OR_DEPARTMENT_OTHER): Payer: Self-pay

## 2023-12-17 ENCOUNTER — Other Ambulatory Visit (HOSPITAL_COMMUNITY): Payer: Self-pay

## 2023-12-17 MED ORDER — IBUPROFEN 800 MG PO TABS
800.0000 mg | ORAL_TABLET | Freq: Three times a day (TID) | ORAL | 0 refills | Status: AC
  Filled 2023-12-17: qty 21, 7d supply, fill #0

## 2023-12-17 MED ORDER — AMOXICILLIN-POT CLAVULANATE 500-125 MG PO TABS
1.0000 | ORAL_TABLET | Freq: Three times a day (TID) | ORAL | 0 refills | Status: DC
  Filled 2023-12-17: qty 30, 10d supply, fill #0

## 2024-01-05 ENCOUNTER — Ambulatory Visit: Payer: Self-pay | Admitting: Obstetrics and Gynecology

## 2024-01-05 ENCOUNTER — Other Ambulatory Visit

## 2024-01-23 ENCOUNTER — Other Ambulatory Visit

## 2024-01-23 ENCOUNTER — Ambulatory Visit: Payer: Self-pay | Admitting: Obstetrics

## 2024-01-26 ENCOUNTER — Other Ambulatory Visit

## 2024-01-26 ENCOUNTER — Ambulatory Visit (INDEPENDENT_AMBULATORY_CARE_PROVIDER_SITE_OTHER): Payer: Self-pay | Admitting: Obstetrics and Gynecology

## 2024-01-26 DIAGNOSIS — Z30430 Encounter for insertion of intrauterine contraceptive device: Secondary | ICD-10-CM

## 2024-01-26 MED ORDER — PARAGARD INTRAUTERINE COPPER IU IUD
1.0000 | INTRAUTERINE_SYSTEM | Freq: Once | INTRAUTERINE | Status: AC
  Administered 2024-01-26: 12:00:00 1 via INTRAUTERINE

## 2024-01-26 NOTE — Progress Notes (Signed)
° ° °  GYNECOLOGY OFFICE PROCEDURE NOTE  Barbara [name] is a 28 y.o. [phone] here for Liletta IUD insertion. No GYN concerns.  Last pap smear was on 4/25 and was normal.  IUD Insertion Procedure Note Patient identified, informed consent performed, consent signed.   Discussed risks of irregular bleeding, cramping, infection, malpositioning or misplacement of the IUD outside the uterus which may require further procedure such as laparoscopy. Also discussed >99% contraception efficacy, increased risk of ectopic pregnancy with failure of method.  Time out was performed.  Urine pregnancy test negative.  Speculum placed in the vagina.  Cervix visualized.  Cleaned with Betadine x 2.  Grasped anteriorly with a single tooth tenaculum.  Uterus sounded to 7 cm.  Paragard  IUD placed per manufacturer's recommendations.  Strings trimmed to 3 cm. Tenaculum was removed, good hemostasis noted.  Patient tolerated procedure well.   Patient was given post-procedure instructions.  She was advised to have backup contraception for one week.  Patient was also asked to check IUD strings periodically and follow up in 4 weeks for IUD check. PT will need to get postpartum 2 hour GTT at that time.  Jerilynn Buddle, MD, FACOG Obstetrician & Gynecologist, [facility] for [facility], [facility]

## 2024-01-26 NOTE — Progress Notes (Signed)
 Post Partum Visit Note  Barbara [name] is a 28 y.o. [phone] female who presents for a postpartum visit. She is 8 weeks postpartum following a normal spontaneous vaginal delivery.  I have fully reviewed the prenatal and intrapartum course. The delivery was at 37.3 gestational weeks.  Anesthesia: epidural. Postpartum course has been uncomplicated. Baby is doing well. Baby is feeding by bottle - Similac Advance. Bleeding no bleeding. Bowel function is normal. Bladder function is normal. Patient is not sexually active. Contraception method is abstinence.  Postpartum depression screening: positive, score 16.   The pregnancy intention screening data noted above was reviewed. Potential methods of contraception were discussed. The patient elected to proceed with copper  IUD.   Edinburgh Postnatal Depression Scale - 01/26/24 1053       Edinburgh Postnatal Depression Scale:  In the Past 7 Days   I have been able to laugh and see the funny side of things. 1    I have looked forward with enjoyment to things. 3    I have blamed myself unnecessarily when things went wrong. 2    I have been anxious or worried for no good reason. 2    I have felt scared or panicky for no good reason. 0    Things have been getting on top of me. 1    I have been so unhappy that I have had difficulty sleeping. 2    I have felt sad or miserable. 3    I have been so unhappy that I have been crying. 2    The thought of harming myself has occurred to me. 0    Edinburgh Postnatal Depression Scale Total 16          Health Maintenance Due  Topic Date Due   Pneumococcal Vaccine (1 of 2 - PCV) Never done   Hepatitis B Vaccines 19-59 Average Risk (1 of 3 - 19+ 3-dose series) Never done   HPV VACCINES (1 - 3-dose SCDM series) Never done   Influenza Vaccine  09/12/2023   COVID-19 Vaccine (1 - 2025-26 season) Never done    The following portions of the patient's history were reviewed and updated as appropriate:  allergies, current medications, past family history, past medical history, past social history, past surgical history, and problem list.  Review of Systems Pertinent items are noted in HPI.  Objective:  BP 119/80   Pulse 85   Wt 252 lb (114.3 kg)   LMP  (LMP Unknown)   Breastfeeding No   BMI 41.93 kg/m    General:  alert, cooperative, no distress, and moderately obese   Breasts:  not indicated  Lungs: clear to auscultation bilaterally  Heart:  regular rate and rhythm  Abdomen: soft, non-tender; bowel sounds normal; no masses,  no organomegaly   Wound N/a  GU exam:  normal       Assessment:    Encounter for postpartum care  normal postpartum exam.   Plan:   Essential components of care per ACOG recommendations:  1.  Mood and well being: Patient with positive depression screening today. Reviewed local resources for support.  - Patient tobacco use? No.   - hx of drug use? No.    2. Infant care and feeding:  -Patient currently breastmilk feeding? No.  -Social determinants of health (SDOH) reviewed in EPIC. No concerns.  3. Sexuality, contraception and birth spacing - Patient does not want a pregnancy in the next year.  Desired family size is 3 children.  -  Reviewed reproductive life planning. Reviewed contraceptive methods based on pt preferences and effectiveness.  Patient desired IUD or IUS today.   - Discussed birth spacing of 18 months  4. Sleep and fatigue -Encouraged family/partner/community support of 4 hrs of uninterrupted sleep to help with mood and fatigue  5. Physical Recovery  - Discussed patients delivery and complications. She describes her labor as good. - Patient had a Vaginal, no problems at delivery. Patient had  no laceration. Perineal healing reviewed. Patient expressed understanding - Patient has urinary incontinence? No. - Patient is safe to resume physical and sexual activity  6.  Health Maintenance - HM due items addressed Yes - Last pap  smear  Diagnosis  Date Value Ref Range Status  06/09/2023   Final   - Negative for Intraepithelial Lesions or Malignancy (NILM)  06/09/2023 - Benign reactive/reparative changes  Final   Pap smear not done at today's visit.  -Breast Cancer screening indicated? No.   7. Chronic Disease/Pregnancy Condition follow up: Gestational Diabetes Pt needs follow up postpartum 2 hour GTT - PCP follow up  Jerilynn DELENA Buddle, MD Center for [facility], [facility]

## 2024-02-10 ENCOUNTER — Other Ambulatory Visit

## 2024-02-26 ENCOUNTER — Ambulatory Visit: Payer: Self-pay | Admitting: Physician Assistant

## 2024-03-10 ENCOUNTER — Ambulatory Visit: Admitting: Obstetrics & Gynecology
